# Patient Record
Sex: Male | Born: 1980
Health system: Southern US, Community
[De-identification: ages and names within clinical notes are randomized; demographics above are authoritative.]

## PROBLEM LIST (undated history)

## (undated) DIAGNOSIS — F99 Mental disorder, not otherwise specified: Secondary | ICD-10-CM

## (undated) DIAGNOSIS — I10 Essential (primary) hypertension: Secondary | ICD-10-CM

## (undated) DIAGNOSIS — Z21 Asymptomatic human immunodeficiency virus [HIV] infection status: Secondary | ICD-10-CM

## (undated) DIAGNOSIS — Z915 Personal history of self-harm: Secondary | ICD-10-CM

## (undated) DIAGNOSIS — B20 Human immunodeficiency virus [HIV] disease: Secondary | ICD-10-CM

## (undated) DIAGNOSIS — F329 Major depressive disorder, single episode, unspecified: Secondary | ICD-10-CM

## (undated) DIAGNOSIS — F32A Depression, unspecified: Secondary | ICD-10-CM

## (undated) HISTORY — DX: Major depressive disorder, single episode, unspecified: F32.9

## (undated) HISTORY — DX: Depression, unspecified: F32.A

## (undated) HISTORY — DX: Human immunodeficiency virus (HIV) disease: B20

## (undated) HISTORY — DX: Asymptomatic human immunodeficiency virus (hiv) infection status: Z21

## (undated) HISTORY — DX: Personal history of self-harm: Z91.5

---

## 2006-01-30 ENCOUNTER — Emergency Department (HOSPITAL_COMMUNITY): Admission: EM | Admit: 2006-01-30 | Discharge: 2006-01-30 | Payer: Self-pay | Admitting: Family Medicine

## 2006-09-29 ENCOUNTER — Emergency Department (HOSPITAL_COMMUNITY): Admission: EM | Admit: 2006-09-29 | Discharge: 2006-09-29 | Payer: Self-pay | Admitting: *Deleted

## 2006-10-16 ENCOUNTER — Emergency Department (HOSPITAL_COMMUNITY): Admission: EM | Admit: 2006-10-16 | Discharge: 2006-10-16 | Payer: Self-pay | Admitting: Emergency Medicine

## 2006-11-02 ENCOUNTER — Emergency Department (HOSPITAL_COMMUNITY): Admission: EM | Admit: 2006-11-02 | Discharge: 2006-11-02 | Payer: Self-pay | Admitting: Family Medicine

## 2007-05-04 ENCOUNTER — Emergency Department (HOSPITAL_COMMUNITY): Admission: EM | Admit: 2007-05-04 | Discharge: 2007-05-04 | Payer: Self-pay | Admitting: Emergency Medicine

## 2008-07-11 ENCOUNTER — Emergency Department (HOSPITAL_COMMUNITY): Admission: EM | Admit: 2008-07-11 | Discharge: 2008-07-11 | Payer: Self-pay | Admitting: Family Medicine

## 2010-03-17 ENCOUNTER — Emergency Department (HOSPITAL_COMMUNITY): Admission: EM | Admit: 2010-03-17 | Discharge: 2010-02-10 | Payer: Self-pay | Admitting: Emergency Medicine

## 2010-06-21 LAB — DIFFERENTIAL
Eosinophils Absolute: 0.3 10*3/uL (ref 0.0–0.7)
Lymphs Abs: 2 10*3/uL (ref 0.7–4.0)
Monocytes Absolute: 1.3 10*3/uL — ABNORMAL HIGH (ref 0.1–1.0)
Monocytes Relative: 13 % — ABNORMAL HIGH (ref 3–12)
Neutrophils Relative %: 65 % (ref 43–77)

## 2010-06-21 LAB — POCT I-STAT, CHEM 8
Creatinine, Ser: 1.3 mg/dL (ref 0.4–1.5)
Glucose, Bld: 97 mg/dL (ref 70–99)
Hemoglobin: 17.3 g/dL — ABNORMAL HIGH (ref 13.0–17.0)
Potassium: 3.9 mEq/L (ref 3.5–5.1)

## 2010-06-21 LAB — CBC
HCT: 45.8 % (ref 39.0–52.0)
Hemoglobin: 15.8 g/dL (ref 13.0–17.0)
MCH: 32.3 pg (ref 26.0–34.0)
MCV: 93.7 fL (ref 78.0–100.0)
RBC: 4.89 MIL/uL (ref 4.22–5.81)

## 2010-06-21 LAB — POCT CARDIAC MARKERS
CKMB, poc: <1 ng/mL — ABNORMAL LOW (ref 1.0–8.0)
Troponin i, poc: <0.05 ng/mL (ref 0.00–0.09)
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
## 2010-10-18 ENCOUNTER — Emergency Department (HOSPITAL_COMMUNITY)
Admission: EM | Admit: 2010-10-18 | Discharge: 2010-10-18 | Disposition: A | Discharge status: Home / Self Care | Payer: Medicaid Other | Attending: Emergency Medicine | Admitting: Emergency Medicine

## 2010-10-18 DIAGNOSIS — Z79899 Other long term (current) drug therapy: Secondary | ICD-10-CM | POA: Insufficient documentation

## 2010-10-18 DIAGNOSIS — Z760 Encounter for issue of repeat prescription: Secondary | ICD-10-CM | POA: Insufficient documentation

## 2010-10-18 DIAGNOSIS — I10 Essential (primary) hypertension: Secondary | ICD-10-CM | POA: Insufficient documentation

## 2011-07-22 ENCOUNTER — Emergency Department (HOSPITAL_COMMUNITY)
Admission: EM | Admit: 2011-07-22 | Discharge: 2011-07-22 | Disposition: A | Discharge status: Home / Self Care | Payer: Medicaid Other | Attending: Emergency Medicine | Admitting: Emergency Medicine

## 2011-07-22 ENCOUNTER — Encounter (HOSPITAL_COMMUNITY): Payer: Self-pay | Admitting: Emergency Medicine

## 2011-07-22 DIAGNOSIS — R45851 Suicidal ideations: Secondary | ICD-10-CM | POA: Insufficient documentation

## 2011-07-22 DIAGNOSIS — I10 Essential (primary) hypertension: Secondary | ICD-10-CM | POA: Insufficient documentation

## 2011-07-22 DIAGNOSIS — F329 Major depressive disorder, single episode, unspecified: Secondary | ICD-10-CM | POA: Insufficient documentation

## 2011-07-22 DIAGNOSIS — F3289 Other specified depressive episodes: Secondary | ICD-10-CM | POA: Insufficient documentation

## 2011-07-22 DIAGNOSIS — Z79899 Other long term (current) drug therapy: Secondary | ICD-10-CM | POA: Insufficient documentation

## 2011-07-22 HISTORY — DX: Mental disorder, not otherwise specified: F99

## 2011-07-22 HISTORY — DX: Essential (primary) hypertension: I10

## 2011-07-22 LAB — COMPREHENSIVE METABOLIC PANEL
ALT: 16 U/L (ref 0–53)
Alkaline Phosphatase: 63 U/L (ref 39–117)
CO2: 19 mEq/L (ref 19–32)
GFR calc Af Amer: >90 mL/min (ref >90)
GFR calc non Af Amer: >90 mL/min (ref >90)
Glucose, Bld: 79 mg/dL (ref 70–99)
Potassium: 3.2 mEq/L — ABNORMAL LOW (ref 3.5–5.1)
Sodium: 137 mEq/L (ref 135–145)
Total Bilirubin: 0.2 mg/dL — ABNORMAL LOW (ref 0.3–1.2)

## 2011-07-22 LAB — RAPID URINE DRUG SCREEN, HOSP PERFORMED
Barbiturates: NOT DETECTED
Tetrahydrocannabinol: POSITIVE — AB

## 2011-07-22 LAB — CBC
Hemoglobin: 14.5 g/dL (ref 13.0–17.0)
MCH: 32 pg (ref 26.0–34.0)
RBC: 4.53 MIL/uL (ref 4.22–5.81)

## 2011-07-22 MED ORDER — ALUM & MAG HYDROXIDE-SIMETH 200-200-20 MG/5ML PO SUSP: 30.0000 mL | ORAL | Status: DC | PRN

## 2011-07-22 MED ORDER — ZOLPIDEM TARTRATE 5 MG PO TABS: 5.0000 mg | ORAL_TABLET | Freq: Every evening | ORAL | Status: DC | PRN

## 2011-07-22 MED ORDER — IBUPROFEN 200 MG PO TABS: 600.0000 mg | ORAL_TABLET | Freq: Three times a day (TID) | ORAL | Status: DC | PRN

## 2011-07-22 MED ORDER — LORAZEPAM 1 MG PO TABS: 1.0000 mg | ORAL_TABLET | Freq: Three times a day (TID) | ORAL | Status: DC | PRN

## 2011-07-22 MED ORDER — LISINOPRIL 20 MG PO TABS
20.0000 mg | ORAL_TABLET | Freq: Every day | ORAL | Status: DC
  Administered 2011-07-22: 20 mg via ORAL
  Filled 2011-07-22: qty 1

## 2011-07-22 MED ORDER — ONDANSETRON HCL 4 MG PO TABS: 4.0000 mg | ORAL_TABLET | Freq: Three times a day (TID) | ORAL | Status: DC | PRN

## 2011-07-22 NOTE — ED Notes (Signed)
Pt sitting up in bed eating breakfast

## 2011-07-22 NOTE — Discharge Instructions (Signed)
FOLLOW UP AS YOU HAVE BEEN INSTRUCTED--TAKE YOUR HIGH BLOOD PRESSURE MEDICATION AS DIRECTED Suicidal Feelings, How to Help Yourself Everyone feels sad or unhappy at times, but depressing thoughts and feelings of hopelessness can lead to thoughts of suicide. It can seem as if life is too tough to handle. It is as if the mountain is just too high and your climbing skills are not great enough. At that moment these dark thoughts and feelings may seem overwhelming and never ending. It is important to remember these feelings are temporary! They will go away. If you feel as though you have reached the point where suicide is the only answer, it is time to let someone know immediately. This is the first step to feeling better. The following steps will move you to safer ground and lead you in a positive direction out of depression. HOW TO COPE AND PREVENT SUICIDE  Let family, friends, teachers and/or counselors know. Get help. Try not to isolate yourself from those who care about you. Even though you may not feel sociable or think that you are not good company, talk with someone everyday. It is best if it is face to face. Remember, they will want to help you.   Eat a regularly spaced and well-balanced diet, and get plenty of rest.   Avoid alcohol and drugs because they will only make you feel worse and may also lower your inhibitions. Remove them from the home. If you are thinking of taking an overdose of your prescribed medications, give your medicines to someone who can give them to you one day at a time. If you are on antidepressants, let your caregiver know of your feelings so he or she can provide a safer medication, if that is a concern.   Remove weapons or poisons from your home.   Try to stick to routines. That may mean just walking the dog or feeding the cat. Follow a schedule and remind yourself that you have to keep that schedule every day. Play with your pets. If it is possible, and you do not have  a pet, get one. They give you a sense of well-being, lower your blood pressure and make your heart feel good. They need you, and we all want to be needed.   Set some realistic goals and achieve them. Make a list and cross things off as you go. Accomplishments give a sense of worth. Wait until you are feeling better before doing things you find difficult or unpleasant to do.   If you are able, try to start exercising. Even half-hour periods of exercise each day will make you feel better. Getting out in the sun or into nature helps you recover from depression faster. If you have a favorite place to walk, take advantage of that.   Increase safe activities that have always given you pleasure. This may include playing your favorite music, reading a good book, painting a picture or playing your favorite instrument. Do whatever takes your mind off your depression and puts a smile on your face.   Keep your living space well lit with windows open, and let the sun shine in. Bright light definitely treats depression, not just people with the seasonal affective disorders (SAD).  Above all else remember, depression is temporary. It will go away. Do not contemplate suicide. Death as a permanent solution is not the answer. Suicide will take away the beautiful rest of life, and do lifelong harm to those around you who love you. Help  is available. National Suicide Help Lines with 24 hour help are: 1-800-SUICIDE 5627437919 Document Released: 10/01/2002 Document Revised: 03/16/2011 Document Reviewed: 02/19/2007 [facility] Patient Information 2012 [place], [place].Depression  Depression is a strong emotion of feeling unhappy that can last for weeks, months, or even longer. Depression causes problems with the ability to function in life. It upsets your:   Relationships.   Sleep.   Eating habits.   Work habits.  HOME CARE  Take all medicine as told by your doctor.   Talk with a therapist, counselor, or  friend.   Eat a healthy diet.   Exercise regularly.   Do not drink alcohol or use drugs.  GET HELP RIGHT AWAY IF: You start to have thoughts about hurting yourself or others. MAKE SURE YOU:  Understand these instructions.   Will watch your condition.   Will get help right away if you are not doing well or get worse.  Document Released: 04/29/2010 Document Revised: 03/16/2011 Document Reviewed: 04/29/2010 [facility] Patient Information 2012 [place], [place].

## 2011-07-22 NOTE — ED Notes (Signed)
Sitter at [employer]

## 2011-07-22 NOTE — ED Notes (Signed)
Made aware of plan of care.

## 2011-07-22 NOTE — ED Notes (Signed)
Rounded on patient. Patient reports he wants to go home. Reports he only came in to talk to someone and now is being kept here. Pt made aware of reason for being held here and also made aware

## 2011-07-22 NOTE — ED Notes (Signed)
Sitting up in bed talking with male family member that is visiting. Sitter remains at bedside.

## 2011-07-22 NOTE — ED Provider Notes (Signed)
Pt had tele-psych eval and pt cleared for d/c. He denies si to me and states that he realizes that calling 911 to ' just talk to someone' isn't appropriate. He states that he will call the 1-800 crisis hotline next time. He has been given referral at time of d/c. Pt is stable for d/c  Toy Baker, MD 07/22/11 1356

## 2011-07-22 NOTE — ED Notes (Signed)
Lunch tray delivered. Pt sitting up in bed eating lunch. No concerns voiced.

## 2011-07-22 NOTE — ED Notes (Signed)
Patient refusing to remove jewelry to complete policy deadlines. Patient aware that policy states that patient seeking help will need to put all belongings in bag to be locked away. Patient refusing to follow guidelines. RN notified.

## 2011-07-22 NOTE — ED Notes (Signed)
Pt discharged to home. DC instructions given. No concerns voiced. Left unit ambulating to checkout. Left in good condition.
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
## 2011-07-22 NOTE — ED Notes (Signed)
Breakfast tray given. °

## 2011-07-22 NOTE — ED Provider Notes (Signed)
History     CSN: [id number]  Arrival date & time 07/22/11  0155   First MD Initiated Contact with Patient 07/22/11 [phone]      Chief Complaint  Patient presents with  . Medical Clearance  . Suicidal     HPI  History provided by the patient. Patient is a 31 year old male with past history of hypertension and psychiatric problems who presents for thoughts of suicide earlier this evening. Patient reported calling 911 in telling her operator and please take he was feeling suicidal. Patient now tells me that he only feels like she needs someone to talk with. Patient states he does not feel like he was "cut myself or anything like that". Patient does report having a history of feeling similar symptoms when he was age 63. Patient states that he was never evaluated or treated for that and that he just "got through". Patient denies any aggravating factors causing these symptoms. Patient denies any other complaints.    Past Medical History  Diagnosis Date  . Hypertension   . Psychiatric problem     History reviewed. No pertinent past surgical history.  History reviewed. No pertinent family history.  History  Substance Use Topics  . Smoking status: Current Everyday Smoker  . Smokeless tobacco: Not on file  . Alcohol Use: Yes      Review of Systems  Constitutional: Negative for fever.  Cardiovascular: Negative for chest pain.  Gastrointestinal: Negative for abdominal pain.  Neurological: Negative for headaches.  Psychiatric/Behavioral: Positive for suicidal ideas. Negative for hallucinations, confusion and self-injury.    Allergies  Review of patient's allergies indicates no known allergies.  Home Medications   Current Outpatient Rx  Name Route Sig Dispense Refill  . LISINOPRIL 20 MG PO TABS Oral Take 20 mg by mouth daily.      BP 166/100  Pulse 87  Temp(Src) 98.3 F (36.8 C) (Oral)  Resp 20  Wt 155 lb (70.308 kg)  SpO2 97%  Physical Exam  Nursing note and vitals  reviewed. Constitutional: He is oriented to person, place, and time. He appears well-developed and well-nourished. No distress.  HENT:  Head: Normocephalic and atraumatic.  Cardiovascular: Normal rate and regular rhythm.   Pulmonary/Chest: Effort normal and breath sounds normal. No respiratory distress. He has no wheezes. He has no rales.  Abdominal: Soft.  Neurological: He is alert and oriented to person, place, and time.  Skin: Skin is warm.  Psychiatric: He has a normal mood and affect. His behavior is normal.    ED Course  Procedures   Results for orders placed during the hospital encounter of 07/22/11  CBC      Component Value Range   WBC 5.0  4.0 - 10.5 (K/uL)   RBC 4.53  4.22 - 5.81 (MIL/uL)   Hemoglobin 14.5  13.0 - 17.0 (g/dL)   HCT 11.9  14.7 - 82.9 (%)   MCV 93.6  78.0 - 100.0 (fL)   MCH 32.0  26.0 - 34.0 (pg)   MCHC 34.2  30.0 - 36.0 (g/dL)   RDW 56.2  13.0 - 86.5 (%)   Platelets 330  150 - 400 (K/uL)  COMPREHENSIVE METABOLIC PANEL      Component Value Range   Sodium 137  135 - 145 (mEq/L)   Potassium 3.2 (*) 3.5 - 5.1 (mEq/L)   Chloride 101  96 - 112 (mEq/L)   CO2 19  19 - 32 (mEq/L)   Glucose, Bld 79  70 - 99 (mg/dL)  BUN 19  6 - 23 (mg/dL)   Creatinine, Ser 9.56  0.50 - 1.35 (mg/dL)   Calcium 9.3  8.4 - 21.3 (mg/dL)   Total Protein 9.2 (*) 6.0 - 8.3 (g/dL)   Albumin 4.5  3.5 - 5.2 (g/dL)   AST 26  0 - 37 (U/L)   ALT 16  0 - 53 (U/L)   Alkaline Phosphatase 63  39 - 117 (U/L)   Total Bilirubin 0.2 (*) 0.3 - 1.2 (mg/dL)   GFR calc non Af Amer >90  >90 (mL/min)   GFR calc Af Amer >90  >90 (mL/min)  ETHANOL      Component Value Range   Alcohol, Ethyl (B) 180 (*) 0 - 11 (mg/dL)  ACETAMINOPHEN LEVEL      Component Value Range   Acetaminophen (Tylenol), Serum <15.0  10 - 30 (ug/mL)  URINE RAPID DRUG SCREEN (HOSP PERFORMED)      Component Value Range   Opiates NONE DETECTED  NONE DETECTED    Cocaine POSITIVE (*) NONE DETECTED    Benzodiazepines NONE  DETECTED  NONE DETECTED    Amphetamines NONE DETECTED  NONE DETECTED    Tetrahydrocannabinol POSITIVE (*) NONE DETECTED    Barbiturates NONE DETECTED  NONE DETECTED        1. Depression       MDM  3:55 AM patient seen and evaluated. Patient no acute distress.   IVC papers taken out by Dr. Hyacinth Meeker.  I have spoken with act team they will see pt.  Telepsych also ordered.  Psych holding orders in place.     Angus Seller, [place] 07/22/11 [phone]

## 2011-07-22 NOTE — BH Assessment (Signed)
Assessment Note   [name] is an 31 y.o. male.   Pt reports he was depressed last night and wanted someone to speak with about his depression and called 911.  Pt reports that he has had thoughts of suicide but "I would not act on it."  Pt denies plan or intent to hurt self at this time.  Pt denies HI, AVH and does not want help with SA related issues.  Pt reports drinking alcohol but appears to minimize cocaine and THC use and did not provide use/duration related info.   Pt made eye contact and spoke calmly reporting he wanted to leave.  Pt Ox3 and pt was cooperative.  While pt affect was appropriate, pt could not elaborate on his rationale for contact the police about being suicidal and connecting it to the police bringing him to the ER.  Pt repeated he wanted someone to speak with about "how I am feeling and I just didn't think the ER is where I would end up."  Pt judgement is poor and he reports being last hospitalized at [facility] when he was a teenager.    Pt is having a Tele Psych and the MD will make recommendations based on his or her evaluation.  If Inptx is recommended then [facility] will review for placement.  Axis I: Mood Disorder NOS and Polysubstance Abuse Axis II: Deferred Axis III:  Past Medical History  Diagnosis Date  . Hypertension   . Psychiatric problem    Axis IV: other psychosocial or environmental problems, problems related to social environment and problems with primary support group Axis V: 31-40 impairment in reality testing  Past Medical History:  Past Medical History  Diagnosis Date  . Hypertension   . Psychiatric problem     History reviewed. No pertinent past surgical history.  Family History: History reviewed. No pertinent family history.  Social History:  reports that he has been smoking.  He does not have any smokeless tobacco history on file. He reports that he drinks alcohol. He reports that he uses illicit drugs (Marijuana).  Additional  Social History:  Alcohol / Drug Use Pain Medications: na Prescriptions: na Over the Counter: na History of alcohol / drug use?: Yes Substance #1 Name of Substance 1: alcohol 1 - Age of First Use: teen 1 - Amount (size/oz): varies 1 - Frequency: 4x week 1 - Duration: years 1 - Last Use / Amount: 07-21-11 / 6pk Allergies: No Known Allergies  Home Medications:  Medications Prior to Admission  Medication Dose Route Frequency Provider Last Rate Last Dose  . alum & mag hydroxide-simeth (MAALOX/MYLANTA) 200-200-20 MG/5ML suspension 30 mL  30 mL Oral PRN Angus Seller, PA      . ibuprofen (ADVIL,MOTRIN) tablet 600 mg  600 mg Oral Q8H PRN Angus Seller, PA      . LORazepam (ATIVAN) tablet 1 mg  1 mg Oral Q8H PRN Angus Seller, PA      . ondansetron [facility]) tablet 4 mg  4 mg Oral Q8H PRN Angus Seller, PA      . zolpidem (AMBIEN) tablet 5 mg  5 mg Oral QHS PRN Angus Seller, PA       Medications Prior to Admission  Medication Sig Dispense Refill  . lisinopril (PRINIVIL,ZESTRIL) 20 MG tablet Take 20 mg by mouth daily.        OB/GYN Status:  No LMP for male patient.  General Assessment Data Location of Assessment: [facility] ED Living  Arrangements: Alone Can pt return to current living arrangement?: Yes Admission Status: Involuntary Is patient capable of signing voluntary admission?: Yes Transfer from: Acute Hospital Referral Source: MD  Education Status Is patient currently in school?: No  Risk to self Suicidal Ideation: No-Not Currently/Within Last 6 Months Suicidal Intent: No-Not Currently/Within Last 6 Months Is patient at risk for suicide?: No Suicidal Plan?: No-Not Currently/Within Last 6 Months Access to Means: No What has been your use of drugs/alcohol within the last 12 months?: alcohol Previous Attempts/Gestures: Yes How many times?: 1  Other Self Harm Risks: 0 Triggers for Past Attempts: Unpredictable Intentional Self Injurious Behavior: None Family Suicide History:  Unknown Recent stressful life event(s): Other (Comment) (depressed and wanted to speak with someone) Persecutory voices/beliefs?: No Depression: Yes Depression Symptoms: Tearfulness;Isolating;Fatigue;Guilt;Loss of interest in usual pleasures;Feeling worthless/self pity;Feeling angry/irritable Substance abuse history and/or treatment for substance abuse?: No Suicide prevention information given to non-admitted patients: Yes  Risk to Others Homicidal Ideation: No Thoughts of Harm to Others: No Current Homicidal Intent: No Current Homicidal Plan: No Access to Homicidal Means: No Identified Victim: 0 History of harm to others?: No Assessment of Violence: None Noted Violent Behavior Description: 0 Does patient have access to weapons?: No Criminal Charges Pending?: No Does patient have a court date: No  Psychosis Hallucinations: None noted Delusions: None noted  Mental Status Report Appear/Hygiene: Disheveled Eye Contact: Fair Motor Activity: Unremarkable Speech: Logical/coherent Level of Consciousness: Alert Mood: Depressed;Anxious Affect: Anxious Anxiety Level: Minimal Thought Processes: Coherent Judgement: Impaired (called 911 because he was depressed) Orientation: Person;Place;Situation Obsessive Compulsive Thoughts/Behaviors: None  Cognitive Functioning Concentration: Decreased Memory: Recent Intact;Remote Intact IQ: Average Insight: Fair Impulse Control: Poor Appetite: Fair Weight Loss: 0  Weight Gain: 0  Sleep: Decreased Total Hours of Sleep: 5  Vegetative Symptoms: None  Prior Inpatient Therapy Prior Inpatient Therapy: Yes Prior Therapy Dates: 1998 Prior Therapy Facilty/Provider(s): [facility] Reason for Treatment: depression  Prior Outpatient Therapy Prior Outpatient Therapy: No Prior Therapy Dates: 0 Prior Therapy Facilty/Provider(s): 0 Reason for Treatment: 0  ADL Screening (condition at time of admission) Patient's cognitive ability adequate to safely  complete daily activities?: Yes Patient able to express need for assistance with ADLs?: Yes Independently performs ADLs?: Yes Weakness of Legs: None Weakness of Arms/Hands: None  Home Assistive Devices/Equipment Home Assistive Devices/Equipment: None  Therapy Consults (therapy consults require a physician order) PT Evaluation Needed: No OT Evalulation Needed: No SLP Evaluation Needed: No Abuse/Neglect Assessment (Assessment to be complete while patient is alone) Physical Abuse: Denies Verbal Abuse: Denies Sexual Abuse: Denies Exploitation of patient/patient's resources: Denies Self-Neglect: Denies Values / Beliefs Cultural Requests During Hospitalization: None Spiritual Requests During Hospitalization: None Consults Spiritual Care Consult Needed: No Social Work Consult Needed: No Merchant navy officer (For Healthcare) Advance Directive: Patient does not have advance directive Pre-existing out of facility DNR order (yellow form or pink MOST form): No Nutrition Screen Unintentional weight loss greater than 10lbs within the last month: No Problems chewing or swallowing foods and/or liquids: No Home Tube Feeding or Total Parenteral Nutrition (TPN): No Patient appears severely malnourished: No  Additional Information 1:1 In Past 12 Months?: No CIRT Risk: No Elopement Risk: No Does patient have medical clearance?: No     Disposition:  Disposition Disposition of Patient: Other dispositions (Tele Psych will make recommendation) Other disposition(s): Other (Comment) (See Tele Psych for recommendation)  On Site Evaluation by:   Reviewed with Physician:     [name], [name] 07/22/2011 11:49 AM

## 2011-07-22 NOTE — ED Notes (Signed)
telepsyche paperwork faxed. Pt sitting in bed watching television. Sitter remains at bedside.

## 2011-07-22 NOTE — ED Notes (Signed)
MD made aware of patient's bp.

## 2011-07-23 NOTE — ED Provider Notes (Signed)
Medical screening examination/treatment/procedure(s) were performed by non-physician practitioner and as supervising physician I was immediately available for consultation/collaboration.   Vida Roller, MD 07/23/11 1002

## 2011-07-27 ENCOUNTER — Other Ambulatory Visit (HOSPITAL_COMMUNITY)
Admission: RE | Admit: 2011-07-27 | Discharge: 2011-07-27 | Disposition: A | Discharge status: Home / Self Care | Payer: Medicaid Other | Source: Ambulatory Visit | Attending: Infectious Diseases | Admitting: Infectious Diseases

## 2011-07-27 ENCOUNTER — Ambulatory Visit (INDEPENDENT_AMBULATORY_CARE_PROVIDER_SITE_OTHER): Payer: Medicaid Other

## 2011-07-27 DIAGNOSIS — Z113 Encounter for screening for infections with a predominantly sexual mode of transmission: Secondary | ICD-10-CM | POA: Insufficient documentation

## 2011-07-27 DIAGNOSIS — Z23 Encounter for immunization: Secondary | ICD-10-CM

## 2011-07-27 DIAGNOSIS — I10 Essential (primary) hypertension: Secondary | ICD-10-CM

## 2011-07-27 DIAGNOSIS — F819 Developmental disorder of scholastic skills, unspecified: Secondary | ICD-10-CM

## 2011-07-27 DIAGNOSIS — B20 Human immunodeficiency virus [HIV] disease: Secondary | ICD-10-CM

## 2011-07-27 DIAGNOSIS — Z915 Personal history of self-harm: Secondary | ICD-10-CM

## 2011-07-27 DIAGNOSIS — F99 Mental disorder, not otherwise specified: Secondary | ICD-10-CM

## 2011-07-27 LAB — LIPID PANEL: Cholesterol: 164 mg/dL (ref 0–200)

## 2011-07-27 LAB — CBC WITH DIFFERENTIAL/PLATELET
Basophils Relative: 1 % (ref 0–1)
Hemoglobin: 13.6 g/dL (ref 13.0–17.0)
Lymphs Abs: 1.3 10*3/uL (ref 0.7–4.0)
MCHC: 33.3 g/dL (ref 30.0–36.0)
Monocytes Relative: 7 % (ref 3–12)
Neutro Abs: 2.7 10*3/uL (ref 1.7–7.7)
Neutrophils Relative %: 62 % (ref 43–77)
RBC: 4.31 MIL/uL (ref 4.22–5.81)

## 2011-07-27 LAB — COMPLETE METABOLIC PANEL WITH GFR
AST: 18 U/L (ref 0–37)
Calcium: 9.7 mg/dL (ref 8.4–10.5)
Creat: 0.96 mg/dL (ref 0.50–1.35)
GFR, Est African American: >89 mL/min
Glucose, Bld: 86 mg/dL (ref 70–99)
Potassium: 4 mEq/L (ref 3.5–5.3)
Sodium: 137 mEq/L (ref 135–145)
Total Bilirubin: 0.4 mg/dL (ref 0.3–1.2)

## 2011-07-28 DIAGNOSIS — F99 Mental disorder, not otherwise specified: Secondary | ICD-10-CM | POA: Insufficient documentation

## 2011-07-28 DIAGNOSIS — Z9151 Personal history of suicidal behavior: Secondary | ICD-10-CM

## 2011-07-28 DIAGNOSIS — Z915 Personal history of self-harm: Secondary | ICD-10-CM | POA: Insufficient documentation

## 2011-07-28 DIAGNOSIS — F819 Developmental disorder of scholastic skills, unspecified: Secondary | ICD-10-CM | POA: Insufficient documentation

## 2011-07-28 HISTORY — DX: Personal history of suicidal behavior: Z91.51

## 2011-07-28 LAB — URINALYSIS
Glucose, UA: NEGATIVE mg/dL
Hgb urine dipstick: NEGATIVE
Leukocytes, UA: NEGATIVE
Nitrite: NEGATIVE
Protein, ur: NEGATIVE mg/dL
pH: 6 (ref 5.0–8.0)

## 2011-07-28 LAB — HEPATITIS B CORE ANTIBODY, TOTAL: Hep B Core Total Ab: NEGATIVE

## 2011-07-28 LAB — HEPATITIS B SURFACE ANTIBODY,QUALITATIVE: Hep B S Ab: NEGATIVE

## 2011-07-28 LAB — T-HELPER CELL (CD4) - (RCID CLINIC ONLY): CD4 % Helper T Cell: 27 % — ABNORMAL LOW (ref 33–55)

## 2011-07-28 LAB — HEPATITIS A ANTIBODY, TOTAL: Hep A Total Ab: NEGATIVE

## 2011-07-28 NOTE — Progress Notes (Signed)
Pt clearly has some form of learning disability due to the answers he has given during the intake.  Questions were reduced to a childlike manner for pt to understand.    Laurell Josephs, RN

## 2011-08-01 LAB — HIV-1 RNA ULTRAQUANT REFLEX TO GENTYP+
HIV 1 RNA Quant: 4075 copies/mL — ABNORMAL HIGH (ref <20)
HIV-1 RNA Quant, Log: 3.61 {Log} — ABNORMAL HIGH (ref <1.30)

## 2011-08-10 ENCOUNTER — Ambulatory Visit: Payer: Medicaid Other | Admitting: Internal Medicine

## 2011-08-11 LAB — HIV-1 GENOTYPR PLUS

## 2011-08-25 ENCOUNTER — Ambulatory Visit (INDEPENDENT_AMBULATORY_CARE_PROVIDER_SITE_OTHER): Payer: Medicaid Other | Admitting: Internal Medicine

## 2011-08-25 ENCOUNTER — Encounter: Payer: Self-pay | Admitting: Internal Medicine

## 2011-08-25 VITALS — BP 178/124 | HR 70 | Temp 98.2°F | Wt 138.0 lb

## 2011-08-25 DIAGNOSIS — Z23 Encounter for immunization: Secondary | ICD-10-CM

## 2011-08-25 DIAGNOSIS — I10 Essential (primary) hypertension: Secondary | ICD-10-CM | POA: Insufficient documentation

## 2011-08-25 DIAGNOSIS — B20 Human immunodeficiency virus [HIV] disease: Secondary | ICD-10-CM

## 2011-08-25 MED ORDER — DARUNAVIR ETHANOLATE 800 MG PO TABS: 800.0000 mg | ORAL_TABLET | Freq: Every day | ORAL | Status: DC

## 2011-08-25 MED ORDER — RITONAVIR 100 MG PO TABS: 100.0000 mg | ORAL_TABLET | Freq: Every day | ORAL | Status: DC

## 2011-08-25 MED ORDER — EMTRICITABINE-TENOFOVIR DF 200-300 MG PO TABS: 1.0000 | ORAL_TABLET | Freq: Every day | ORAL | Status: DC

## 2011-08-25 MED ORDER — LISINOPRIL 20 MG PO TABS: 20.0000 mg | ORAL_TABLET | Freq: Every day | ORAL | Status: DC

## 2011-08-25 MED ORDER — AMLODIPINE BESYLATE 5 MG PO TABS: 5.0000 mg | ORAL_TABLET | Freq: Every day | ORAL | Status: DC

## 2011-08-25 NOTE — Patient Instructions (Signed)
Take your medicine every day

## 2011-08-25 NOTE — Assessment & Plan Note (Signed)
His blood pressure is notably elevated. I did refill his medications today and told him to start taking it today. The patient voiced his understanding. He is going to follow up with his primary physician regarding his blood pressure.

## 2011-08-25 NOTE — Progress Notes (Signed)
  Subjective:    Patient ID: [name], male    DOB: 1980-06-13, 31 y.o.   MRN: [id number]  HPI He comes in for evaluation as a new patient. This is a new diagnosis for him. He recently found out his partner was positive and he has been tested and a recent emergency room visit where he went for counseling. The patient has a learning disability. He also has depression. He does have a history of suicide attempt but no recent suicidal ideation. He also has poorly controlled blood pressure and has been off his blood pressure medicines for 2 months. He does followup with family practice clinic. The patient did ask questions and have some minimal understanding of HIV.   Review of Systems  Constitutional: Negative.   HENT: Negative for sore throat and trouble swallowing.   Respiratory: Negative for cough, choking, shortness of breath and wheezing.   Cardiovascular: Negative.   Gastrointestinal: Negative.   Musculoskeletal: Negative.   Skin: Negative.   Neurological: Negative.   Hematological: Negative for adenopathy.  Psychiatric/Behavioral: Positive for dysphoric mood. Negative for suicidal ideas, behavioral problems and sleep disturbance. The patient is not nervous/anxious.        Objective:   Physical Exam  Constitutional: He appears well-developed and well-nourished. No distress.  HENT:  Mouth/Throat: Oropharynx is clear and moist. No oropharyngeal exudate.  Cardiovascular: Normal rate, regular rhythm and normal heart sounds.  Exam reveals no gallop and no friction rub.   No murmur heard. Pulmonary/Chest: Effort normal and breath sounds normal. No respiratory distress. He has no wheezes. He has no rales.  Abdominal: Bowel sounds are normal. He exhibits no distension. There is no tenderness. There is no rebound.  Lymphadenopathy:    He has no cervical adenopathy.  Skin: Skin is warm and dry. No rash noted. No erythema.  Psychiatric: He has a normal mood and affect. His behavior is  normal.          Assessment & Plan:

## 2011-08-25 NOTE — Assessment & Plan Note (Signed)
I did discuss treatment options with the patient. He does tell me that he has had a difficult time taking medicine daily and does miss doses of his blood pressure medicine when he has it. He tells me about 2-3 times a week he forgets. He is hopeful that he is able to be more compliant now. He is interested in a one pill a day, however of the options I did discuss with him that I prefer to start with a 3 pillow they options that is more for giving and if he is doing well taking medicine daily this may be readdressed and he can start a 1 pill a day option at that time. I did remind him that he needs to use condoms with all sexual activity.he does not need prophylaxis at this time. I will have him return in 4 weeks for labs and I will see him one to 2 weeks later.

## 2011-09-26 ENCOUNTER — Ambulatory Visit: Payer: Medicaid Other | Admitting: *Deleted

## 2011-09-26 ENCOUNTER — Other Ambulatory Visit: Payer: Medicaid Other

## 2011-09-26 VITALS — BP 130/72 | HR 71

## 2011-09-26 DIAGNOSIS — I10 Essential (primary) hypertension: Secondary | ICD-10-CM

## 2011-09-26 DIAGNOSIS — B20 Human immunodeficiency virus [HIV] disease: Secondary | ICD-10-CM

## 2011-09-26 LAB — COMPREHENSIVE METABOLIC PANEL
Albumin: 4.5 g/dL (ref 3.5–5.2)
Alkaline Phosphatase: 40 U/L (ref 39–117)
BUN: 10 mg/dL (ref 6–23)
CO2: 23 mEq/L (ref 19–32)
Glucose, Bld: 74 mg/dL (ref 70–99)
Sodium: 135 mEq/L (ref 135–145)
Total Bilirubin: 0.4 mg/dL (ref 0.3–1.2)
Total Protein: 7.4 g/dL (ref 6.0–8.3)

## 2011-09-26 LAB — CBC WITH DIFFERENTIAL/PLATELET
Basophils Relative: 1 % (ref 0–1)
Eosinophils Absolute: 0.1 10*3/uL (ref 0.0–0.7)
HCT: 37.8 % — ABNORMAL LOW (ref 39.0–52.0)
Hemoglobin: 12.7 g/dL — ABNORMAL LOW (ref 13.0–17.0)
Lymphs Abs: 1.3 10*3/uL (ref 0.7–4.0)
MCH: 30.8 pg (ref 26.0–34.0)
MCHC: 33.6 g/dL (ref 30.0–36.0)
MCV: 91.5 fL (ref 78.0–100.0)
Monocytes Absolute: 0.2 10*3/uL (ref 0.1–1.0)
Monocytes Relative: 6 % (ref 3–12)
RBC: 4.13 MIL/uL — ABNORMAL LOW (ref 4.22–5.81)

## 2011-09-26 NOTE — Progress Notes (Signed)
Here for labs & wanted bp done as he has been compliant with bp meds for a month. BP is down. I discussed salt in diet & smoking cessation to help lower bp. States he is trying.  He has refills & knows to call a week ahead of time to get them

## 2011-09-27 LAB — T-HELPER CELL (CD4) - (RCID CLINIC ONLY)
CD4 % Helper T Cell: 32 % — ABNORMAL LOW (ref 33–55)
CD4 T Cell Abs: 380 uL — ABNORMAL LOW (ref 400–2700)

## 2011-10-10 ENCOUNTER — Ambulatory Visit (INDEPENDENT_AMBULATORY_CARE_PROVIDER_SITE_OTHER): Payer: Medicaid Other | Admitting: Internal Medicine

## 2011-10-10 ENCOUNTER — Encounter: Payer: Self-pay | Admitting: Internal Medicine

## 2011-10-10 VITALS — BP 126/77 | HR 76 | Temp 98.1°F | Ht 68.0 in | Wt 131.0 lb

## 2011-10-10 DIAGNOSIS — I10 Essential (primary) hypertension: Secondary | ICD-10-CM

## 2011-10-10 DIAGNOSIS — B20 Human immunodeficiency virus [HIV] disease: Secondary | ICD-10-CM

## 2011-10-10 NOTE — Assessment & Plan Note (Signed)
He is doing well but unfortunately likely will be out of medication for one week. He was counseled extensively on the dangers of missing medications, particularly since he is doing so well with compliance and nearly undetectable viral load. He did voice his understanding. The patient was offered and given condoms. He knows to use them with all sexual activity. He will return in 3 months with labs 2 weeks before to keep a close eye on his viral load.

## 2011-10-10 NOTE — Progress Notes (Signed)
  Subjective:    Patient ID: [name], male    DOB: [birth year], 31 y.o.   MRN: [id number]  HPI He comes in for evaluation of his HIV. This is his second visit and he was recently started on his current regimen which is Norvir, Prezista, and Truvada. He reports 100% compliance. Unfortunately, he did provide a one week supply of his own medication to a friend who had run out of their medication. He therefore likely will be out one week though he is hopeful that the pharmacy will be able to fill it early.  He otherwise has no complaints. He has been seeing a family practice doctor in [facility] though is now going to transfer his primary care to someone in [place] though this has not yet been determined. He has had no abdominal discomfort, rash or other issues with the medication.   Review of Systems  Constitutional: Positive for fatigue. Negative for fever, appetite change and unexpected weight change.  HENT: Negative for sore throat and trouble swallowing.   Respiratory: Negative for cough and shortness of breath.   Cardiovascular: Negative for chest pain, palpitations and leg swelling.  Gastrointestinal: Negative for nausea, abdominal pain, diarrhea and constipation.  Musculoskeletal: Negative for myalgias, joint swelling and arthralgias.  Skin: Negative for rash.  Neurological: Negative for dizziness, weakness and headaches.  Hematological: Negative for adenopathy.  Psychiatric/Behavioral: Negative for dysphoric mood. The patient is not nervous/anxious.        Objective:   Physical Exam  Constitutional: He appears well-developed and well-nourished. No distress.  HENT:  Mouth/Throat: Oropharynx is clear and moist. No oropharyngeal exudate.  Cardiovascular: Normal rate, regular rhythm and normal heart sounds.  Exam reveals no gallop and no friction rub.   No murmur heard. Pulmonary/Chest: Effort normal and breath sounds normal. No respiratory distress. He has no wheezes. He has no  rales.  Abdominal: Soft. Bowel sounds are normal. He exhibits no distension. There is no tenderness. There is no rebound.  Lymphadenopathy:    He has no cervical adenopathy.  Skin: Skin is warm and dry. No rash noted.          Assessment & Plan:

## 2011-10-10 NOTE — Assessment & Plan Note (Signed)
His blood pressure is better today compared to last visit. He is going to establish with a new primary care physician and is working with [facility].

## 2011-11-07 ENCOUNTER — Encounter: Payer: Self-pay | Admitting: Internal Medicine

## 2011-11-07 ENCOUNTER — Telehealth: Payer: Self-pay | Admitting: *Deleted

## 2011-11-07 ENCOUNTER — Ambulatory Visit (INDEPENDENT_AMBULATORY_CARE_PROVIDER_SITE_OTHER): Payer: Medicaid Other | Admitting: Internal Medicine

## 2011-11-07 VITALS — BP 137/80 | HR 75 | Temp 98.2°F | Ht 68.0 in | Wt 141.0 lb

## 2011-11-07 DIAGNOSIS — N529 Male erectile dysfunction, unspecified: Secondary | ICD-10-CM

## 2011-11-07 MED ORDER — SILDENAFIL CITRATE 25 MG PO TABS: 25.0000 mg | ORAL_TABLET | Freq: Every day | ORAL | Status: DC | PRN

## 2011-11-07 NOTE — Assessment & Plan Note (Signed)
Will give a trial of Viagra. No symptoms concerning of angina. Specifically no chest pain with walking or at rest. If this does not improve with the Viagra, he may be referred to urology

## 2011-11-07 NOTE — Progress Notes (Signed)
  Subjective:    Patient ID: [name], male    DOB: [birth year], 31 y.o.   MRN: [id number]  HPI He comes in here concerned about erectile dysfunction. He noted this about 10 months ago and has only had an erection about 5 times over the last 10 months. He was concerned about his blood pressure medicines being the cause though this did occur prior to starting the blood pressure medicines. No penile pain, no swelling, no edema, no discharge.   Review of Systems  Genitourinary: Negative for dysuria, frequency, hematuria, discharge, penile swelling, scrotal swelling, difficulty urinating, genital sores, penile pain and testicular pain.       Objective:   Physical Exam  Constitutional: He appears well-developed and well-nourished. No distress.          Assessment & Plan:

## 2011-11-07 NOTE — Telephone Encounter (Signed)
error 

## 2011-11-29 ENCOUNTER — Encounter: Payer: Medicaid Other | Admitting: Internal Medicine

## 2011-12-28 ENCOUNTER — Other Ambulatory Visit: Payer: Medicaid Other

## 2011-12-28 DIAGNOSIS — B20 Human immunodeficiency virus [HIV] disease: Secondary | ICD-10-CM

## 2011-12-28 LAB — CBC WITH DIFFERENTIAL/PLATELET
Basophils Absolute: 0.1 10*3/uL (ref 0.0–0.1)
Basophils Relative: 2 % — ABNORMAL HIGH (ref 0–1)
Eosinophils Absolute: 0.1 10*3/uL (ref 0.0–0.7)
Eosinophils Relative: 3 % (ref 0–5)
HCT: 37.1 % — ABNORMAL LOW (ref 39.0–52.0)
MCH: 30.7 pg (ref 26.0–34.0)
MCHC: 33.7 g/dL (ref 30.0–36.0)
Monocytes Absolute: 0.3 10*3/uL (ref 0.1–1.0)
Neutro Abs: 1.8 10*3/uL (ref 1.7–7.7)
RDW: 12.1 % (ref 11.5–15.5)

## 2011-12-29 LAB — COMPLETE METABOLIC PANEL WITH GFR
AST: 18 U/L (ref 0–37)
Alkaline Phosphatase: 35 U/L — ABNORMAL LOW (ref 39–117)
BUN: 9 mg/dL (ref 6–23)
Creat: 0.93 mg/dL (ref 0.50–1.35)
GFR, Est Non African American: >89 mL/min
Glucose, Bld: 94 mg/dL (ref 70–99)
Potassium: 4.8 mEq/L (ref 3.5–5.3)
Total Bilirubin: 0.6 mg/dL (ref 0.3–1.2)

## 2011-12-29 LAB — T-HELPER CELL (CD4) - (RCID CLINIC ONLY)
CD4 % Helper T Cell: 32 % — ABNORMAL LOW (ref 33–55)
CD4 T Cell Abs: 420 uL (ref 400–2700)

## 2011-12-29 LAB — HIV-1 RNA QUANT-NO REFLEX-BLD: HIV-1 RNA Quant, Log: <1.3 {Log} (ref <1.30)

## 2012-01-11 ENCOUNTER — Encounter: Payer: Self-pay | Admitting: Internal Medicine

## 2012-01-11 ENCOUNTER — Ambulatory Visit (INDEPENDENT_AMBULATORY_CARE_PROVIDER_SITE_OTHER): Payer: Medicaid Other | Admitting: Internal Medicine

## 2012-01-11 VITALS — BP 138/81 | HR 69 | Temp 98.3°F | Ht 68.0 in | Wt 137.5 lb

## 2012-01-11 DIAGNOSIS — B20 Human immunodeficiency virus [HIV] disease: Secondary | ICD-10-CM

## 2012-01-11 DIAGNOSIS — I10 Essential (primary) hypertension: Secondary | ICD-10-CM

## 2012-01-11 DIAGNOSIS — Z23 Encounter for immunization: Secondary | ICD-10-CM

## 2012-01-11 MED ORDER — LISINOPRIL 20 MG PO TABS: 20.0000 mg | ORAL_TABLET | Freq: Every day | ORAL | Status: DC

## 2012-01-11 MED ORDER — EMTRICITABINE-TENOFOVIR DF 200-300 MG PO TABS: 1.0000 | ORAL_TABLET | Freq: Every day | ORAL | Status: DC

## 2012-01-11 MED ORDER — DARUNAVIR ETHANOLATE 800 MG PO TABS: 800.0000 mg | ORAL_TABLET | Freq: Every day | ORAL | Status: DC

## 2012-01-11 MED ORDER — AMLODIPINE BESYLATE 5 MG PO TABS: 5.0000 mg | ORAL_TABLET | Freq: Every day | ORAL | Status: DC

## 2012-01-11 MED ORDER — RITONAVIR 100 MG PO TABS: 100.0000 mg | ORAL_TABLET | Freq: Every day | ORAL | Status: DC

## 2012-01-11 NOTE — Progress Notes (Signed)
  Subjective:    Patient ID: [name], male    DOB: 1980-11-01, 31 y.o.   MRN: [id number]  HPI He comes in here for routine followup. He continues to take Prezista, Norvir and Truvada. He has no significant issues with the medication and in fact feels really well. No recent emergency room visits or hospitalizations. He reports 100% compliance no new issues. He also has blood pressure and does take his blood pressure medications. He tried Viagra which I prescribed to him but at this time is not active so as not using.   Review of Systems  Constitutional: Negative for fever and chills.  HENT: Negative for sore throat and trouble swallowing.   Respiratory: Negative for cough and shortness of breath.   Cardiovascular: Negative for palpitations and leg swelling.  Gastrointestinal: Negative for nausea and diarrhea.  Musculoskeletal: Negative for myalgias, joint swelling and arthralgias.  Skin: Negative for rash.  Neurological: Negative for headaches.  Psychiatric/Behavioral: Negative for suicidal ideas, disturbed wake/sleep cycle and dysphoric mood. The patient is not nervous/anxious.        Objective:   Physical Exam  Constitutional: He appears well-developed and well-nourished. No distress.  HENT:  Mouth/Throat: Oropharynx is clear and moist. No oropharyngeal exudate.  Cardiovascular: Normal rate, regular rhythm and normal heart sounds.  Exam reveals no gallop and no friction rub.   No murmur heard. Pulmonary/Chest: Effort normal and breath sounds normal. No respiratory distress. He has no wheezes. He has no rales.          Assessment & Plan:

## 2012-01-11 NOTE — Assessment & Plan Note (Signed)
He continues to do well and was encouraged to continue to take his medicines. He will return in 6 months. He does use condoms with sexual activity.

## 2012-01-11 NOTE — Assessment & Plan Note (Signed)
His blood pressure is stable, I will continue to monitor this and no changes to his medications at this time

## 2012-01-15 ENCOUNTER — Ambulatory Visit: Payer: Medicaid Other

## 2012-07-11 ENCOUNTER — Other Ambulatory Visit: Payer: Medicaid Other

## 2012-07-11 ENCOUNTER — Other Ambulatory Visit: Payer: Self-pay | Admitting: Internal Medicine

## 2012-07-11 DIAGNOSIS — B20 Human immunodeficiency virus [HIV] disease: Secondary | ICD-10-CM

## 2012-07-12 LAB — T-HELPER CELL (CD4) - (RCID CLINIC ONLY): CD4 T Cell Abs: 670 uL (ref 400–2700)

## 2012-07-29 ENCOUNTER — Ambulatory Visit (INDEPENDENT_AMBULATORY_CARE_PROVIDER_SITE_OTHER): Payer: Medicaid Other | Admitting: Internal Medicine

## 2012-07-29 ENCOUNTER — Encounter: Payer: Self-pay | Admitting: Internal Medicine

## 2012-07-29 ENCOUNTER — Encounter: Payer: Self-pay | Admitting: *Deleted

## 2012-07-29 VITALS — BP 155/96 | HR 109 | Temp 99.3°F | Ht 68.0 in | Wt 149.5 lb

## 2012-07-29 DIAGNOSIS — Z23 Encounter for immunization: Secondary | ICD-10-CM

## 2012-07-29 DIAGNOSIS — Z113 Encounter for screening for infections with a predominantly sexual mode of transmission: Secondary | ICD-10-CM

## 2012-07-29 DIAGNOSIS — I10 Essential (primary) hypertension: Secondary | ICD-10-CM

## 2012-07-29 DIAGNOSIS — B20 Human immunodeficiency virus [HIV] disease: Secondary | ICD-10-CM

## 2012-07-29 DIAGNOSIS — Z79899 Other long term (current) drug therapy: Secondary | ICD-10-CM

## 2012-07-29 MED ORDER — AMLODIPINE BESYLATE 5 MG PO TABS: 5.0000 mg | ORAL_TABLET | Freq: Every day | ORAL | Status: DC

## 2012-07-29 MED ORDER — RITONAVIR 100 MG PO TABS: 100.0000 mg | ORAL_TABLET | Freq: Every day | ORAL | Status: DC

## 2012-07-29 MED ORDER — EMTRICITABINE-TENOFOVIR DF 200-300 MG PO TABS: 1.0000 | ORAL_TABLET | Freq: Every day | ORAL | Status: DC

## 2012-07-29 MED ORDER — LISINOPRIL 20 MG PO TABS: 20.0000 mg | ORAL_TABLET | Freq: Every day | ORAL | Status: DC

## 2012-07-29 MED ORDER — DARUNAVIR ETHANOLATE 800 MG PO TABS: 800.0000 mg | ORAL_TABLET | Freq: Every day | ORAL | Status: DC

## 2012-07-29 NOTE — Progress Notes (Signed)
  Subjective:    Patient ID: [name], male    DOB: 02/29/1981, 32 y.o.   MRN: [id number]  HPI He comes in here for routine followup. He continues to take Prezista, Norvir and Truvada. He has no significant issues with the medication and in fact feels really well. No recent emergency room visits or hospitalizations. He reports 100% compliance no new issues. He also has blood pressure and does take his blood pressure medications, though it is elevated today. He denies any headaches, weight loss no recent hospitalizations. He feels well overall.  He did recently get a lot of teeth pulled.    Review of Systems  Constitutional: Negative for fever, appetite change, fatigue and unexpected weight change.  HENT: Negative for sore throat and trouble swallowing.   Respiratory: Negative for shortness of breath.   Cardiovascular: Negative for leg swelling.  Gastrointestinal: Negative for nausea, abdominal pain and diarrhea.  Musculoskeletal: Negative for myalgias, joint swelling and arthralgias.  Skin: Negative for rash.  Neurological: Negative for dizziness and headaches.  Psychiatric/Behavioral: The patient is not nervous/anxious.        Objective:   Physical Exam  Constitutional: He appears well-developed and well-nourished. No distress.  HENT:  Mouth/Throat: Oropharynx is clear and moist. No oropharyngeal exudate.  Cardiovascular: Normal rate, regular rhythm and normal heart sounds.  Exam reveals no gallop and no friction rub.   No murmur heard. Pulmonary/Chest: Effort normal and breath sounds normal. No respiratory distress. He has no wheezes. He has no rales.  Abdominal: Soft. Bowel sounds are normal. He exhibits no distension. There is no tenderness. There is no rebound.          Assessment & Plan:

## 2012-07-29 NOTE — Assessment & Plan Note (Signed)
His blood pressure is a little elevated today. I will have him come back in 3 months to recheck his blood pressure. I discussed diet modifications including reducing salt and most importantly I discussed smoking cessation. He is pre-contemplative at this time

## 2012-07-29 NOTE — Assessment & Plan Note (Signed)
He is doing well with the continued suppressed viral load and CD4 count.  He will return in 3 months for fasting labs.

## 2012-08-18 ENCOUNTER — Emergency Department (HOSPITAL_COMMUNITY): Payer: Medicaid Other

## 2012-08-18 ENCOUNTER — Encounter (HOSPITAL_COMMUNITY): Payer: Self-pay

## 2012-08-18 ENCOUNTER — Inpatient Hospital Stay (HOSPITAL_COMMUNITY)
Admission: EM | Admit: 2012-08-18 | Discharge: 2012-09-06 | DRG: 463 | Disposition: A | Discharge status: Rehabilitation Facility | Payer: Medicaid Other | Attending: General Surgery | Admitting: General Surgery

## 2012-08-18 DIAGNOSIS — S12100A Unspecified displaced fracture of second cervical vertebra, initial encounter for closed fracture: Secondary | ICD-10-CM

## 2012-08-18 DIAGNOSIS — S0101XA Laceration without foreign body of scalp, initial encounter: Secondary | ICD-10-CM

## 2012-08-18 DIAGNOSIS — D75829 Heparin-induced thrombocytopenia, unspecified: Secondary | ICD-10-CM | POA: Diagnosis not present

## 2012-08-18 DIAGNOSIS — D7582 Heparin induced thrombocytopenia (HIT): Secondary | ICD-10-CM | POA: Diagnosis not present

## 2012-08-18 DIAGNOSIS — F17200 Nicotine dependence, unspecified, uncomplicated: Secondary | ICD-10-CM | POA: Diagnosis present

## 2012-08-18 DIAGNOSIS — S02839A Fracture of medial orbital wall, unspecified side, initial encounter for closed fracture: Secondary | ICD-10-CM

## 2012-08-18 DIAGNOSIS — S82109A Unspecified fracture of upper end of unspecified tibia, initial encounter for closed fracture: Principal | ICD-10-CM | POA: Diagnosis present

## 2012-08-18 DIAGNOSIS — Z91199 Patient's noncompliance with other medical treatment and regimen due to unspecified reason: Secondary | ICD-10-CM

## 2012-08-18 DIAGNOSIS — E876 Hypokalemia: Secondary | ICD-10-CM | POA: Diagnosis not present

## 2012-08-18 DIAGNOSIS — S61209A Unspecified open wound of unspecified finger without damage to nail, initial encounter: Secondary | ICD-10-CM | POA: Diagnosis present

## 2012-08-18 DIAGNOSIS — I7774 Dissection of vertebral artery: Secondary | ICD-10-CM | POA: Diagnosis present

## 2012-08-18 DIAGNOSIS — S42001A Fracture of unspecified part of right clavicle, initial encounter for closed fracture: Secondary | ICD-10-CM

## 2012-08-18 DIAGNOSIS — S060XAA Concussion with loss of consciousness status unknown, initial encounter: Secondary | ICD-10-CM

## 2012-08-18 DIAGNOSIS — I10 Essential (primary) hypertension: Secondary | ICD-10-CM | POA: Diagnosis present

## 2012-08-18 DIAGNOSIS — F1210 Cannabis abuse, uncomplicated: Secondary | ICD-10-CM | POA: Diagnosis present

## 2012-08-18 DIAGNOSIS — Z9119 Patient's noncompliance with other medical treatment and regimen: Secondary | ICD-10-CM

## 2012-08-18 DIAGNOSIS — E871 Hypo-osmolality and hyponatremia: Secondary | ICD-10-CM | POA: Diagnosis present

## 2012-08-18 DIAGNOSIS — S0280XA Fracture of other specified skull and facial bones, unspecified side, initial encounter for closed fracture: Secondary | ICD-10-CM | POA: Diagnosis present

## 2012-08-18 DIAGNOSIS — K5900 Constipation, unspecified: Secondary | ICD-10-CM | POA: Diagnosis not present

## 2012-08-18 DIAGNOSIS — S0100XA Unspecified open wound of scalp, initial encounter: Secondary | ICD-10-CM | POA: Diagnosis present

## 2012-08-18 DIAGNOSIS — Z79899 Other long term (current) drug therapy: Secondary | ICD-10-CM

## 2012-08-18 DIAGNOSIS — D62 Acute posthemorrhagic anemia: Secondary | ICD-10-CM | POA: Diagnosis not present

## 2012-08-18 DIAGNOSIS — B20 Human immunodeficiency virus [HIV] disease: Secondary | ICD-10-CM | POA: Diagnosis present

## 2012-08-18 DIAGNOSIS — S82143A Displaced bicondylar fracture of unspecified tibia, initial encounter for closed fracture: Secondary | ICD-10-CM

## 2012-08-18 DIAGNOSIS — S41109A Unspecified open wound of unspecified upper arm, initial encounter: Secondary | ICD-10-CM | POA: Diagnosis present

## 2012-08-18 DIAGNOSIS — S060X9A Concussion with loss of consciousness of unspecified duration, initial encounter: Secondary | ICD-10-CM

## 2012-08-18 DIAGNOSIS — S36113A Laceration of liver, unspecified degree, initial encounter: Secondary | ICD-10-CM

## 2012-08-18 DIAGNOSIS — F1010 Alcohol abuse, uncomplicated: Secondary | ICD-10-CM | POA: Diagnosis present

## 2012-08-18 DIAGNOSIS — S42023A Displaced fracture of shaft of unspecified clavicle, initial encounter for closed fracture: Secondary | ICD-10-CM | POA: Diagnosis present

## 2012-08-18 DIAGNOSIS — IMO0002 Reserved for concepts with insufficient information to code with codable children: Secondary | ICD-10-CM

## 2012-08-18 HISTORY — DX: Fracture of medial orbital wall, unspecified side, initial encounter for closed fracture: S02.839A

## 2012-08-18 HISTORY — DX: Human immunodeficiency virus (HIV) disease: B20

## 2012-08-18 HISTORY — DX: Asymptomatic human immunodeficiency virus (hiv) infection status: Z21

## 2012-08-18 HISTORY — DX: Unspecified displaced fracture of second cervical vertebra, initial encounter for closed fracture: S12.100A

## 2012-08-18 LAB — POCT I-STAT, CHEM 8
BUN: 8 mg/dL (ref 6–23)
Calcium, Ion: 1.15 mmol/L (ref 1.12–1.23)
Chloride: 105 mEq/L (ref 96–112)
Creatinine, Ser: 1.3 mg/dL (ref 0.50–1.35)
Glucose, Bld: 139 mg/dL — ABNORMAL HIGH (ref 70–99)
HCT: 45 % (ref 39.0–52.0)
Hemoglobin: 15.3 g/dL (ref 13.0–17.0)
Potassium: 2.9 mEq/L — ABNORMAL LOW (ref 3.5–5.1)
Sodium: 141 mEq/L (ref 135–145)
TCO2: 21 mmol/L (ref 0–100)

## 2012-08-18 LAB — CG4 I-STAT (LACTIC ACID): Lactic Acid, Venous: 4.36 mmol/L — ABNORMAL HIGH (ref 0.5–2.2)

## 2012-08-18 LAB — CBC
HCT: 40.9 % (ref 39.0–52.0)
Hemoglobin: 14 g/dL (ref 13.0–17.0)
MCV: 92.5 fL (ref 78.0–100.0)
RBC: 4.42 MIL/uL (ref 4.22–5.81)
WBC: 8.8 10*3/uL (ref 4.0–10.5)

## 2012-08-18 LAB — TYPE AND SCREEN
ABO/RH(D): O POS
Antibody Screen: NEGATIVE
Unit division: 0
Unit division: 0

## 2012-08-18 LAB — COMPREHENSIVE METABOLIC PANEL
AST: 75 U/L — ABNORMAL HIGH (ref 0–37)
Alkaline Phosphatase: 54 U/L (ref 39–117)
BUN: 9 mg/dL (ref 6–23)
CO2: 20 mEq/L (ref 19–32)
Chloride: 102 mEq/L (ref 96–112)
Creatinine, Ser: 1.1 mg/dL (ref 0.50–1.35)
GFR calc non Af Amer: 87 mL/min — ABNORMAL LOW (ref >90)
Total Bilirubin: 0.4 mg/dL (ref 0.3–1.2)

## 2012-08-18 LAB — LIPASE, BLOOD: Lipase: 24 U/L (ref 11–59)

## 2012-08-18 LAB — ETHANOL: Alcohol, Ethyl (B): 164 mg/dL — ABNORMAL HIGH (ref 0–11)

## 2012-08-18 MED ORDER — TETANUS-DIPHTH-ACELL PERTUSSIS 5-2.5-18.5 LF-MCG/0.5 IM SUSP
0.5000 mL | Freq: Once | INTRAMUSCULAR | Status: DC
  Filled 2012-08-18: qty 0.5

## 2012-08-18 MED ORDER — FENTANYL CITRATE 0.05 MG/ML IJ SOLN
INTRAMUSCULAR | Status: AC
  Administered 2012-08-18: 50 ug via INTRAVENOUS
  Filled 2012-08-18: qty 2

## 2012-08-18 MED ORDER — IOHEXOL 300 MG/ML  SOLN
100.0000 mL | Freq: Once | INTRAMUSCULAR | Status: AC | PRN
  Administered 2012-08-18: 100 mL via INTRAVENOUS

## 2012-08-18 MED ORDER — FENTANYL CITRATE 0.05 MG/ML IJ SOLN
50.0000 ug | Freq: Once | INTRAMUSCULAR | Status: AC
  Administered 2012-08-18: 50 ug via INTRAVENOUS

## 2012-08-18 MED ORDER — FENTANYL CITRATE 0.05 MG/ML IJ SOLN: 50.0000 ug | Freq: Once | INTRAMUSCULAR | Status: AC

## 2012-08-18 NOTE — ED Notes (Signed)
Pt transported to CT via stretcher with this RN.

## 2012-08-18 NOTE — Progress Notes (Signed)
Orthopedic Tech Progress Note Patient Details:  [name] 01-24-1981 [id number]  Ortho Devices Type of Ortho Device: Knee Immobilizer   Haskell Flirt 08/18/2012, 11:58 PM

## 2012-08-18 NOTE — ED Notes (Addendum)
Pt. Presents with lac to right MCP joint, no crepitis noted, lac to right posterior forearm, swelling abrabsion and laceration to right tib/fib, abrasion and swelling to right cheek, abrasion to upper quadrant/rib. Pulses intact in all extremities. Pt. Responding to verbal stimulus.

## 2012-08-18 NOTE — ED Notes (Signed)
See trauma documentation 

## 2012-08-18 NOTE — ED Notes (Signed)
Pt transported to XR with Verlon Au EN

## 2012-08-18 NOTE — Progress Notes (Signed)
Orthopedic Tech Progress Note Patient Details:  [name] 05/20/1980 [id number]  Patient ID: [name], male   DOB: Jul 03, 1980, 32 y.o.   MRN: [id number] Made level 1 trauma visit  [name] 08/18/2012, 9:38 PM

## 2012-08-18 NOTE — ED Notes (Signed)
Pt. Returned from CT.
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
## 2012-08-19 ENCOUNTER — Encounter (HOSPITAL_COMMUNITY): Payer: Self-pay | Admitting: Radiology

## 2012-08-19 ENCOUNTER — Inpatient Hospital Stay (HOSPITAL_COMMUNITY): Payer: Medicaid Other

## 2012-08-19 DIAGNOSIS — S61209A Unspecified open wound of unspecified finger without damage to nail, initial encounter: Secondary | ICD-10-CM

## 2012-08-19 DIAGNOSIS — I10 Essential (primary) hypertension: Secondary | ICD-10-CM | POA: Diagnosis present

## 2012-08-19 DIAGNOSIS — S158XXA Injury of other specified blood vessels at neck level, initial encounter: Secondary | ICD-10-CM

## 2012-08-19 DIAGNOSIS — B20 Human immunodeficiency virus [HIV] disease: Secondary | ICD-10-CM | POA: Diagnosis present

## 2012-08-19 DIAGNOSIS — S82143A Displaced bicondylar fracture of unspecified tibia, initial encounter for closed fracture: Secondary | ICD-10-CM

## 2012-08-19 DIAGNOSIS — S36113A Laceration of liver, unspecified degree, initial encounter: Secondary | ICD-10-CM

## 2012-08-19 DIAGNOSIS — I7774 Dissection of vertebral artery: Secondary | ICD-10-CM

## 2012-08-19 DIAGNOSIS — S0100XA Unspecified open wound of scalp, initial encounter: Secondary | ICD-10-CM

## 2012-08-19 DIAGNOSIS — S129XXA Fracture of neck, unspecified, initial encounter: Secondary | ICD-10-CM

## 2012-08-19 DIAGNOSIS — S42009A Fracture of unspecified part of unspecified clavicle, initial encounter for closed fracture: Secondary | ICD-10-CM

## 2012-08-19 DIAGNOSIS — F1010 Alcohol abuse, uncomplicated: Secondary | ICD-10-CM | POA: Diagnosis present

## 2012-08-19 HISTORY — DX: Dissection of vertebral artery: I77.74

## 2012-08-19 LAB — HEPARIN LEVEL (UNFRACTIONATED): Heparin Unfractionated: <0.1 [IU]/mL — ABNORMAL LOW (ref 0.30–0.70)

## 2012-08-19 LAB — URINALYSIS, ROUTINE W REFLEX MICROSCOPIC
Bilirubin Urine: NEGATIVE
Glucose, UA: NEGATIVE mg/dL
Ketones, ur: 15 mg/dL — AB
Leukocytes, UA: NEGATIVE
Nitrite: NEGATIVE
Protein, ur: NEGATIVE mg/dL
Specific Gravity, Urine: 1.01 (ref 1.005–1.030)
Urobilinogen, UA: 1 mg/dL (ref 0.0–1.0)
pH: 6.5 (ref 5.0–8.0)

## 2012-08-19 LAB — ABO/RH: ABO/RH(D): O POS

## 2012-08-19 LAB — CBC
HCT: 40.8 % (ref 39.0–52.0)
HCT: 40.5 % (ref 39.0–52.0)
HCT: 38.5 % — ABNORMAL LOW (ref 39.0–52.0)
Hemoglobin: 13.3 g/dL (ref 13.0–17.0)
Hemoglobin: 13.6 g/dL (ref 13.0–17.0)
Hemoglobin: 12.8 g/dL — ABNORMAL LOW (ref 13.0–17.0)
MCH: 30.8 pg (ref 26.0–34.0)
MCH: 32 pg (ref 26.0–34.0)
MCHC: 32.6 g/dL (ref 30.0–36.0)
MCHC: 33.2 g/dL (ref 30.0–36.0)
MCV: 94.4 fL (ref 78.0–100.0)
MCV: 93.8 fL (ref 78.0–100.0)
MCV: 94.1 fL (ref 78.0–100.0)
Platelets: 307 10*3/uL (ref 150–400)
Platelets: 238 10*3/uL (ref 150–400)
RBC: 4.32 MIL/uL (ref 4.22–5.81)
RBC: 4.32 MIL/uL (ref 4.22–5.81)
RBC: 4.06 MIL/uL — ABNORMAL LOW (ref 4.22–5.81)
RDW: 12.1 % (ref 11.5–15.5)
WBC: 20 10*3/uL — ABNORMAL HIGH (ref 4.0–10.5)
WBC: 17.3 10*3/uL — ABNORMAL HIGH (ref 4.0–10.5)

## 2012-08-19 LAB — URINE MICROSCOPIC-ADD ON

## 2012-08-19 LAB — RAPID URINE DRUG SCREEN, HOSP PERFORMED
Amphetamines: NOT DETECTED
Barbiturates: NOT DETECTED
Benzodiazepines: NOT DETECTED
Cocaine: NOT DETECTED
Opiates: NOT DETECTED
Tetrahydrocannabinol: NOT DETECTED

## 2012-08-19 LAB — BASIC METABOLIC PANEL
BUN: 7 mg/dL (ref 6–23)
CO2: 22 mEq/L (ref 19–32)
Chloride: 100 mEq/L (ref 96–112)
Creatinine, Ser: 0.76 mg/dL (ref 0.50–1.35)
Glucose, Bld: 109 mg/dL — ABNORMAL HIGH (ref 70–99)

## 2012-08-19 MED ORDER — MIDAZOLAM HCL 2 MG/2ML IJ SOLN
INTRAMUSCULAR | Status: AC | PRN
  Administered 2012-08-19: 1 mg via INTRAVENOUS

## 2012-08-19 MED ORDER — POTASSIUM CHLORIDE IN NACL 20-0.9 MEQ/L-% IV SOLN
INTRAVENOUS | Status: DC
  Administered 2012-08-19 (×3): via INTRAVENOUS
  Administered 2012-08-20: 125 mL/h via INTRAVENOUS
  Administered 2012-08-20 – 2012-08-21 (×2): via INTRAVENOUS
  Administered 2012-08-21 (×2): 125 mL/h via INTRAVENOUS
  Filled 2012-08-19 (×15): qty 1000

## 2012-08-19 MED ORDER — CEFAZOLIN SODIUM-DEXTROSE 2-3 GM-% IV SOLR
2.0000 g | Freq: Once | INTRAVENOUS | Status: AC
  Administered 2012-08-19: 2 g via INTRAVENOUS
  Filled 2012-08-19: qty 50

## 2012-08-19 MED ORDER — MORPHINE SULFATE 2 MG/ML IJ SOLN
1.0000 mg | INTRAMUSCULAR | Status: DC | PRN
  Administered 2012-08-19 (×3): 2 mg via INTRAVENOUS
  Administered 2012-08-19: 4 mg via INTRAVENOUS
  Administered 2012-08-19: 2 mg via INTRAVENOUS
  Administered 2012-08-19: 4 mg via INTRAVENOUS
  Administered 2012-08-19 (×4): 2 mg via INTRAVENOUS
  Administered 2012-08-19: 4 mg via INTRAVENOUS
  Administered 2012-08-19 – 2012-08-20 (×4): 2 mg via INTRAVENOUS
  Administered 2012-08-20: 4 mg via INTRAVENOUS
  Administered 2012-08-20: 2 mg via INTRAVENOUS
  Filled 2012-08-19: qty 1
  Filled 2012-08-19: qty 2
  Filled 2012-08-19: qty 1
  Filled 2012-08-19: qty 2
  Filled 2012-08-19 (×9): qty 1
  Filled 2012-08-19 (×2): qty 2
  Filled 2012-08-19 (×2): qty 1

## 2012-08-19 MED ORDER — LORAZEPAM 2 MG/ML IJ SOLN
1.0000 mg | Freq: Four times a day (QID) | INTRAMUSCULAR | Status: AC | PRN
  Administered 2012-08-19 – 2012-08-21 (×5): 1 mg via INTRAVENOUS
  Filled 2012-08-19 (×5): qty 1

## 2012-08-19 MED ORDER — ONDANSETRON HCL 4 MG/2ML IJ SOLN: 4.0000 mg | Freq: Four times a day (QID) | INTRAMUSCULAR | Status: DC | PRN

## 2012-08-19 MED ORDER — SODIUM CHLORIDE 0.9 % IV SOLN
Freq: Once | INTRAVENOUS | Status: AC
  Administered 2012-08-19: 01:00:00 via INTRAVENOUS

## 2012-08-19 MED ORDER — HYDRALAZINE HCL 20 MG/ML IJ SOLN
INTRAMUSCULAR | Status: AC
  Filled 2012-08-19: qty 1

## 2012-08-19 MED ORDER — IOHEXOL 300 MG/ML  SOLN
150.0000 mL | Freq: Once | INTRAMUSCULAR | Status: AC | PRN
  Administered 2012-08-19: 1 mL via INTRAVENOUS

## 2012-08-19 MED ORDER — IOHEXOL 350 MG/ML SOLN
50.0000 mL | Freq: Once | INTRAVENOUS | Status: AC | PRN
  Administered 2012-08-19: 50 mL via INTRAVENOUS

## 2012-08-19 MED ORDER — LISINOPRIL 20 MG PO TABS
20.0000 mg | ORAL_TABLET | Freq: Every day | ORAL | Status: DC
  Administered 2012-08-19 – 2012-09-05 (×17): 20 mg via ORAL
  Filled 2012-08-19 (×19): qty 1

## 2012-08-19 MED ORDER — AMLODIPINE BESYLATE 5 MG PO TABS
5.0000 mg | ORAL_TABLET | Freq: Every day | ORAL | Status: DC
  Administered 2012-08-19 – 2012-09-05 (×18): 5 mg via ORAL
  Filled 2012-08-19 (×19): qty 1

## 2012-08-19 MED ORDER — PANTOPRAZOLE SODIUM 40 MG PO TBEC
40.0000 mg | DELAYED_RELEASE_TABLET | Freq: Every day | ORAL | Status: DC
  Administered 2012-08-20 – 2012-09-04 (×14): 40 mg via ORAL
  Filled 2012-08-19 (×14): qty 1

## 2012-08-19 MED ORDER — DIPHENHYDRAMINE HCL 50 MG/ML IJ SOLN
12.5000 mg | Freq: Four times a day (QID) | INTRAMUSCULAR | Status: DC | PRN
  Administered 2012-08-19: 12.5 mg via INTRAVENOUS
  Administered 2012-08-20 – 2012-08-27 (×5): 25 mg via INTRAVENOUS
  Filled 2012-08-19 (×5): qty 1

## 2012-08-19 MED ORDER — SODIUM CHLORIDE 0.9 % IV SOLN: Freq: Once | INTRAVENOUS | Status: DC

## 2012-08-19 MED ORDER — DARUNAVIR ETHANOLATE 800 MG PO TABS
800.0000 mg | ORAL_TABLET | Freq: Every day | ORAL | Status: DC
  Administered 2012-08-20 – 2012-09-06 (×17): 800 mg via ORAL
  Filled 2012-08-19 (×22): qty 1

## 2012-08-19 MED ORDER — ONDANSETRON HCL 4 MG PO TABS
4.0000 mg | ORAL_TABLET | Freq: Four times a day (QID) | ORAL | Status: DC | PRN
  Administered 2012-08-20: 4 mg via ORAL
  Filled 2012-08-19: qty 1

## 2012-08-19 MED ORDER — EMTRICITABINE-TENOFOVIR DF 200-300 MG PO TABS
1.0000 | ORAL_TABLET | Freq: Every day | ORAL | Status: DC
  Administered 2012-08-19 – 2012-09-05 (×17): 1 via ORAL
  Filled 2012-08-19 (×19): qty 1

## 2012-08-19 MED ORDER — HEPARIN (PORCINE) IN NACL 100-0.45 UNIT/ML-% IJ SOLN
850.0000 [IU]/h | INTRAMUSCULAR | Status: DC
  Administered 2012-08-19: 850 [IU]/h via INTRAVENOUS
  Filled 2012-08-19: qty 250

## 2012-08-19 MED ORDER — FENTANYL CITRATE 0.05 MG/ML IJ SOLN
INTRAMUSCULAR | Status: AC | PRN
  Administered 2012-08-19: 25 ug via INTRAVENOUS

## 2012-08-19 MED ORDER — HYDRALAZINE HCL 20 MG/ML IJ SOLN
INTRAMUSCULAR | Status: AC | PRN
  Administered 2012-08-19 (×4): 5 mg via INTRAVENOUS

## 2012-08-19 MED ORDER — LORAZEPAM 1 MG PO TABS
1.0000 mg | ORAL_TABLET | Freq: Four times a day (QID) | ORAL | Status: AC | PRN
  Administered 2012-08-20: 1 mg via ORAL
  Filled 2012-08-19: qty 1

## 2012-08-19 MED ORDER — RITONAVIR 100 MG PO TABS
100.0000 mg | ORAL_TABLET | Freq: Every day | ORAL | Status: DC
  Administered 2012-08-20 – 2012-09-05 (×16): 100 mg via ORAL
  Filled 2012-08-19 (×22): qty 1

## 2012-08-19 MED ORDER — DIPHENHYDRAMINE HCL 50 MG/ML IJ SOLN
INTRAMUSCULAR | Status: AC
  Filled 2012-08-19: qty 1

## 2012-08-19 MED ORDER — ADULT MULTIVITAMIN W/MINERALS CH
1.0000 | ORAL_TABLET | Freq: Every day | ORAL | Status: DC
  Administered 2012-08-20 – 2012-09-05 (×16): 1 via ORAL
  Filled 2012-08-19 (×19): qty 1

## 2012-08-19 MED ORDER — THIAMINE HCL 100 MG/ML IJ SOLN
100.0000 mg | Freq: Every day | INTRAMUSCULAR | Status: DC
  Administered 2012-08-19 – 2012-08-21 (×2): 100 mg via INTRAVENOUS
  Filled 2012-08-19 (×10): qty 1

## 2012-08-19 MED ORDER — FENTANYL CITRATE 0.05 MG/ML IJ SOLN
INTRAMUSCULAR | Status: AC
  Filled 2012-08-19: qty 4

## 2012-08-19 MED ORDER — MIDAZOLAM HCL 2 MG/2ML IJ SOLN
INTRAMUSCULAR | Status: AC
  Filled 2012-08-19: qty 4

## 2012-08-19 MED ORDER — LIDOCAINE HCL (PF) 1 % IJ SOLN
INTRAMUSCULAR | Status: AC
  Administered 2012-08-19: 10 mL
  Filled 2012-08-19: qty 10

## 2012-08-19 MED ORDER — PANTOPRAZOLE SODIUM 40 MG IV SOLR
40.0000 mg | Freq: Every day | INTRAVENOUS | Status: DC
  Administered 2012-08-19 – 2012-08-21 (×2): 40 mg via INTRAVENOUS
  Filled 2012-08-19 (×5): qty 40

## 2012-08-19 MED ORDER — VITAMIN B-1 100 MG PO TABS
100.0000 mg | ORAL_TABLET | Freq: Every day | ORAL | Status: DC
  Administered 2012-08-20 – 2012-09-05 (×16): 100 mg via ORAL
  Filled 2012-08-19 (×19): qty 1

## 2012-08-19 MED ORDER — FOLIC ACID 1 MG PO TABS
1.0000 mg | ORAL_TABLET | Freq: Every day | ORAL | Status: DC
  Administered 2012-08-20 – 2012-09-05 (×16): 1 mg via ORAL
  Filled 2012-08-19 (×19): qty 1
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
## 2012-08-19 NOTE — ED Notes (Signed)
Pt transported to CT then to 2300 via stretcher by [employer]

## 2012-08-19 NOTE — Progress Notes (Signed)
Per Dr. Andrey Campanile made sure that C-collar was on. Patient neck is midline, verified by 2 RNs.
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
## 2012-08-19 NOTE — ED Notes (Addendum)
O2 2l/[place] applied.  Little verbal response.  Procedure explained.  Verbalized understanding.  Encouraged to speak when spoken to during procedure so that we may evaluate neuro status.  Verbalizes understanding.
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
## 2012-08-19 NOTE — Procedures (Addendum)
Pre-operative Diagnosis:right 3rd digit laceration Post-operative Diagnosis:as above Procedures: irrigation and excisional surgical debridement of subcutaneous tissue Surgeon: Ashok Norris, ANP-BC Assist:  Charma Igo, PA-C Indications: suspected foreign body, laceration Technique, risks, benefits, alternatives discussed. The patient expressed understanding & wished to proceed.  The procedure, risks and complications have been discussed in detail (including, but not limited to airway compromise, infection, bleeding) with the patient, and the patient has signed consent to the procedure.  Findings:  Laceration extending to subcutaneous tissue without evidence of foreign body  Anesthesia: Lidocaine 1% local  Fluids:none EBL: 5ml Drains:none Specimens: none Complications:  no immediate complications were noted Condition:  Tolerated the procedure well and stable Procedure Details  The area was prepped with betadine.I sterilely injected a digital block of local aesthetic (1% lidocaine66mL total infused).  The patient tolerated the procedure well. I used sharp dissection with scalpel and scissors to remove necrotic tissue which extender to the subcutaneous tissue which appeared  healthy and vascularized tissue. The area was approximately 2cm in size.   Wound was irrigated with sterile NS.  5cc blood loss.  Hemostasis was achieved with pressure.  I packed the wound with clean gauze & dry dressing.  The patient tolerated the procedure well.       Ashok Norris, [facility] Pager [phone]

## 2012-08-19 NOTE — Progress Notes (Addendum)
Paged Dr. Andrey Campanile to inform him that collar was tightened and centered but patient is very noncompliant and does not keep his head centered. MD aware  Explained to patient the importance of keeping his head centered and the importance of being compliant and listening to medical/nursing staff.  Will continue to provide support, comfort, and will monitor patient.

## 2012-08-19 NOTE — Progress Notes (Signed)
Spoke with IR about angio planned.  Will possibly alter therapy with stent or prolonged anticoagulation.  Ortho and Neuro have not seen the patient yet.  This patient has been seen and I agree with the findings and treatment plan.  Marta Lamas. Gae Bon, MD, FACS [phone] (pager) [phone] (direct pager) Trauma Surgeon

## 2012-08-19 NOTE — Progress Notes (Signed)
Patient took cervical collar off witnessed by myself and transporter, Ellwood Dense. Collar placed back on. He has been told the importance of the cervical collar being left on.  Verbalized understanding. Oriented X 4  Complete neuro assessment done at this time.  No changes.

## 2012-08-19 NOTE — ED Notes (Signed)
O2 d'c'd.  Exoseal closure by Lynann Beaver RT

## 2012-08-19 NOTE — Progress Notes (Signed)
Patient has to consistently be reminded to keep his head straight and still, it has been explained to him on several different occassions.  Will try to provide support and comfort
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
## 2012-08-19 NOTE — Procedures (Signed)
S/P 4 vessel cerebral arteriogram  Lt CFA approach Findings  1. NON flow limiting focal dissection of LT VA at C2 associated with filling defect ,probably representing a thrombus.

## 2012-08-19 NOTE — Consult Note (Signed)
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 imaging of the head, cervical spine, and maxillofacial structures were performed using the standard protocol without intravenous contrast. Multiplanar CT image reconstructions of the cervical spine and maxillofacial structures were also generated.  Comparison:   None  CT HEAD  Findings: There is no evidence for acute hemorrhage, hydrocephalus, mass lesion, or abnormal extra-axial fluid collection.  No definite CT evidence for acute infarction.  The right frontal scalp laceration.  No underlying calvarial fracture.  IMPRESSION: Right frontal scalp laceration.  No underlying  calvarial fracture or acute intracranial abnormality.  CT MAXILLOFACIAL  Findings:  Globes are symmetric.  The lenses are located.  No retrobulbar hematoma.  Hypertrophic changes along the maxillary sinus walls may reflect sequelae of chronic sinusitis or remote trauma.  There is a fracture of the medial orbital wall on the left.  No associated hematoma, therefore favored to be remote however correlate clinically.  Paranasal sinuses and mastoid air cells are otherwise predominately clear.  Temporomandibular  joints are located.  Absent right maxillary central and lateral incisor and central and lateral mandibular dentition, age indeterminate.  Nasal bones and nasal septum intact.  Intact pterygoid plates and zygomatic arches.  IMPRESSION: Left medial orbital wall fracture is not favored to be acute however correlate clinically.  Multiple absent dentition may be post-traumatic.  Correlate clinically.  CT CERVICAL SPINE  Findings:   There is a type 2 dens fracture with mild displacement. Intact craniocervical junction. Vertically oriented linear lucency through the lateral mass C1 on coronal image 9 and 10 of 32 may reflect a small chip fracture. Separate from the type 2 dens fracture, there is a fracture involving the transverse process of C2 on the left, which extends into the transverse foramen as seen on sagittal image 25/34. On the right, a fracture through the lateral aspect of the vertebral body extends just below the foramen through the transverse process as seen on sagittal image 12. Vertebral body height and alignment otherwise maintained.  The lung apices clear. Partially imaged right clavicle fracture.  IMPRESSION: Type 2 dens fracture with mild displacement.  Separate fracture involving C2 laterally, extends into the left transverse foramen.  Question small chip fracture of the left lateral mass C1.  Right clavicle fracture.  Critical Value/emergent results were called by telephone at the time of interpretation on 08/18/2012 at 10:20 p.m. to Dr. Andrey Campanile in person, who verbally acknowledged these results.   Original Report Authenticated By: Jearld Lesch, M.D.     Review of Systems  Constitutional: Negative.   Eyes: Negative.   Endo/Heme/Allergies:       Pos HIV   Blood pressure 156/90, pulse 103, temperature 98.2 F (36.8 C), temperature source Oral, resp. rate 17, height 5\' 8"  (1.727 m), weight 68.04 kg (150 lb), SpO2 99.00%. Physical Exam  Neck:  collar  Cardiovascular: Normal rate.    Respiratory: Effort normal.  GI: Soft.  Musculoskeletal: He exhibits edema and tenderness.  Right elbow abrasion with dressing. Right clavicle tenderness NVI upper extremity  Sensation of dorsal and plantar foot right    Assessment/Plan:   Get CT scan Tuesday and can proceed with ORIF Wed.  Would fix clavicle if pt stable and does well.   Jehan Ranganathan C 08/19/2012, 6:54 PM

## 2012-08-19 NOTE — Progress Notes (Signed)
UR completed 

## 2012-08-19 NOTE — Progress Notes (Signed)
Orthopedic Tech Progress Note Patient Details:  [name] [birth year] [id number] Applied arm sling to RUE. Ortho Devices Type of Ortho Device: Arm sling Ortho Device/Splint Location: RUE Ortho Device/Splint Interventions: Application   [name] 08/19/2012, 9:46 AM
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
## 2012-08-20 ENCOUNTER — Inpatient Hospital Stay (HOSPITAL_COMMUNITY): Payer: Medicaid Other

## 2012-08-20 ENCOUNTER — Encounter (HOSPITAL_COMMUNITY): Payer: Self-pay | Admitting: General Surgery

## 2012-08-20 LAB — CBC
MCH: 31.6 pg (ref 26.0–34.0)
MCV: 94.4 fL (ref 78.0–100.0)
MCV: 94.5 fL (ref 78.0–100.0)
Platelets: 200 10*3/uL (ref 150–400)
Platelets: 182 10*3/uL (ref 150–400)
RBC: 3.73 MIL/uL — ABNORMAL LOW (ref 4.22–5.81)
RBC: 3.63 MIL/uL — ABNORMAL LOW (ref 4.22–5.81)
WBC: 10 10*3/uL (ref 4.0–10.5)

## 2012-08-20 LAB — BASIC METABOLIC PANEL
CO2: 23 mEq/L (ref 19–32)
Calcium: 9.1 mg/dL (ref 8.4–10.5)
GFR calc Af Amer: >90 mL/min (ref >90)
Sodium: 132 mEq/L — ABNORMAL LOW (ref 135–145)

## 2012-08-20 LAB — HEPARIN LEVEL (UNFRACTIONATED): Heparin Unfractionated: <0.1 [IU]/mL — ABNORMAL LOW (ref 0.30–0.70)

## 2012-08-20 MED ORDER — OXYCODONE HCL 5 MG PO TABS
10.0000 mg | ORAL_TABLET | ORAL | Status: DC | PRN
  Administered 2012-08-20: 10 mg via ORAL
  Administered 2012-08-20 – 2012-08-21 (×5): 20 mg via ORAL
  Administered 2012-08-22: 10 mg via ORAL
  Administered 2012-08-22 (×2): 20 mg via ORAL
  Administered 2012-08-23 (×3): 15 mg via ORAL
  Administered 2012-08-23: 10 mg via ORAL
  Administered 2012-08-23 – 2012-08-24 (×2): 15 mg via ORAL
  Administered 2012-08-24 (×3): 10 mg via ORAL
  Administered 2012-08-25 (×2): 20 mg via ORAL
  Administered 2012-08-25: 10 mg via ORAL
  Administered 2012-08-25 (×2): 20 mg via ORAL
  Administered 2012-08-25: 10 mg via ORAL
  Administered 2012-08-26 – 2012-08-31 (×26): 20 mg via ORAL
  Administered 2012-08-31: 15 mg via ORAL
  Administered 2012-08-31: 20 mg via ORAL
  Administered 2012-08-31 – 2012-09-01 (×3): 15 mg via ORAL
  Administered 2012-09-01 (×2): 10 mg via ORAL
  Administered 2012-09-01: 15 mg via ORAL
  Administered 2012-09-01: 5 mg via ORAL
  Administered 2012-09-02 – 2012-09-03 (×7): 10 mg via ORAL
  Administered 2012-09-03: 20 mg via ORAL
  Administered 2012-09-03 (×2): 10 mg via ORAL
  Administered 2012-09-04 – 2012-09-06 (×13): 20 mg via ORAL
  Filled 2012-08-20: qty 2
  Filled 2012-08-20 (×4): qty 4
  Filled 2012-08-20 (×2): qty 2
  Filled 2012-08-20: qty 4
  Filled 2012-08-20 (×3): qty 2
  Filled 2012-08-20 (×2): qty 4
  Filled 2012-08-20: qty 3
  Filled 2012-08-20 (×2): qty 4
  Filled 2012-08-20: qty 2
  Filled 2012-08-20: qty 3
  Filled 2012-08-20 (×7): qty 4
  Filled 2012-08-20: qty 2
  Filled 2012-08-20: qty 3
  Filled 2012-08-20 (×2): qty 4
  Filled 2012-08-20: qty 2
  Filled 2012-08-20 (×2): qty 4
  Filled 2012-08-20: qty 2
  Filled 2012-08-20 (×3): qty 4
  Filled 2012-08-20: qty 2
  Filled 2012-08-20 (×4): qty 4
  Filled 2012-08-20: qty 2
  Filled 2012-08-20: qty 4
  Filled 2012-08-20: qty 2
  Filled 2012-08-20 (×2): qty 4
  Filled 2012-08-20: qty 3
  Filled 2012-08-20 (×2): qty 4
  Filled 2012-08-20: qty 2
  Filled 2012-08-20: qty 4
  Filled 2012-08-20: qty 2
  Filled 2012-08-20 (×4): qty 4
  Filled 2012-08-20: qty 1
  Filled 2012-08-20: qty 3
  Filled 2012-08-20 (×2): qty 2
  Filled 2012-08-20 (×5): qty 4
  Filled 2012-08-20 (×2): qty 2
  Filled 2012-08-20 (×3): qty 4
  Filled 2012-08-20: qty 2
  Filled 2012-08-20 (×3): qty 4
  Filled 2012-08-20: qty 3
  Filled 2012-08-20: qty 4
  Filled 2012-08-20: qty 3
  Filled 2012-08-20 (×2): qty 4
  Filled 2012-08-20: qty 3
  Filled 2012-08-20 (×2): qty 4

## 2012-08-20 MED ORDER — HYDROMORPHONE HCL PF 1 MG/ML IJ SOLN
1.0000 mg | INTRAMUSCULAR | Status: DC | PRN
  Administered 2012-08-20 – 2012-08-22 (×9): 1 mg via INTRAVENOUS
  Filled 2012-08-20 (×9): qty 1

## 2012-08-20 MED ORDER — HEPARIN (PORCINE) IN NACL 100-0.45 UNIT/ML-% IJ SOLN
1300.0000 [IU]/h | INTRAMUSCULAR | Status: DC
  Administered 2012-08-20 (×2): 1050 [IU]/h via INTRAVENOUS
  Administered 2012-08-21: 1300 [IU]/h via INTRAVENOUS
  Filled 2012-08-20: qty 250

## 2012-08-20 MED ORDER — OXYCODONE HCL 5 MG PO TABS: 10.0000 mg | ORAL_TABLET | ORAL | Status: DC | PRN

## 2012-08-20 MED ORDER — SPIRITUS FRUMENTI
1.0000 | Freq: Three times a day (TID) | ORAL | Status: DC
  Administered 2012-08-22 – 2012-08-26 (×12): 1 via ORAL
  Filled 2012-08-20 (×24): qty 1

## 2012-08-20 NOTE — Progress Notes (Addendum)
Patient ID: [name], male   DOB: 01-14-1981, 32 y.o.   MRN: [id number]  [name] is cleared from trauma standpoint with respect to C2 fracture.  I am currently awaiting callback from Dr. Franky Macho to 1. Place a note in chart or 2. Give verbal clearance and I will simply make an addendum as requested by anesthesia.   1452-Spoke with Dr. Franky Macho, pt okay to proceed for surgery.  Ashok Norris, ANP-BC Pager [phone]

## 2012-08-20 NOTE — Progress Notes (Signed)
Patient ID: [name], male   DOB: 01/22/1981, 32 y.o.   MRN: [id number] BP 149/94  Pulse 109  Temp(Src) 100.1 F (37.8 C) (Oral)  Resp 14  Ht 5\' 8"  (1.727 m)  Wt 68.04 kg (150 lb)  BMI 22.81 kg/m2  SpO2 97% Alert and oriented. OK for surgery, prefer he be intubated with the collar in place. If this is not possible then in line traction to the head must be applied.  Will follow after surgery for limb fracture.
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
## 2012-08-20 NOTE — Progress Notes (Signed)
Patient becoming very agitated/restless, pulling off C-Collar regardless of being instructed not to; pulling off orthotic devices, attempting to swing legs around to get OOB, c/o itching; tachypnea and tachycardia noted, BP increased, patient yelling, and threatening to leave AMA. Cristy Hilts, trauma PA notified of CIWA score. New orders received. Ativan given as ordered. Safety maintained.

## 2012-08-20 NOTE — Progress Notes (Signed)
Patient seen and examined.  Agree with PA's note.  

## 2012-08-20 NOTE — Progress Notes (Signed)
I was asked to speak with the pt 2/2 to the fact that he continues to remove his C-collar.  I discussed with the pt the severity of his injury and the need to con't with C-collar to avoid permanent paralysis and intubation.  He states he understood and would keep it on.

## 2012-08-20 NOTE — Progress Notes (Signed)
Reported off to oncoming RN. Safety maintained. No acute distress noted.

## 2012-08-20 NOTE — Progress Notes (Signed)
Turned patient to change sheets, all precautions were used, head was stabilized by one RN while others log-rolled patient.  Patient was in pain but was stable, old sheets did have some blood on them, was dark and did not appear new or bright, paged MD (wyatt and spoke to np), told to continue hepain.  H/H stable, VVS stable

## 2012-08-20 NOTE — Progress Notes (Signed)
Patient noted to have removed C-Collar, orthotic device, and right arm sling. Patient continues to be very  NONCOMPLIANT. C-Collar and right arm sling placed back on patient; patient once again instructed on the importance of keeping the C-Collar in place. Patient laughs and states that he does not care. Patient is extremely non-compliant regardless of multiple instruction and warnings.

## 2012-08-20 NOTE — Progress Notes (Signed)
OT Cancellation Note  Patient Details Name: [name] MRN: [id number] DOB: [birth year]   Cancelled Treatment:     Pt gone for CT, splint lying on bedside table.  Sign posted in room with splint wear and care instructions.  Will follow up in morning.   Jeani Hawking M [phone] 08/20/2012, 3:03 PM
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
## 2012-08-20 NOTE — Progress Notes (Signed)
Note:  CT of knee completed as ordered. Patient was very agitated in CT scan, cursing loudly and attempting to pull off C-Collar. Patient instructed of the importance of his C-Collar. Patient states "I don't care!"  Patient brought back to ICU room 2306 via bed, on monitor. Safety maintained. Patient refuses to be turned, refuses oral care,and refuses bath. No acute distress noted. Safety maintained.
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
## 2012-08-20 NOTE — Progress Notes (Signed)
Patient ID: [name], male   DOB: 05/17/1980, 32 y.o.   MRN: [id number] CT scan shows comminuted medial and lateral plateau fracture with displacement and depression. Will require surgery for correction  .  Will do tibia fixation first and then proceed with clavicle fixation .   Patient noncompliant with collar , removes it despite discussion with him by me as well as nurse and I am sure Dr. Cabbell has discussed with him as well.  His care is compomised by his noncompliance and his treatment result may be poor despite health care team best efforts in his behalf. Risks of surgery, infection, nonunion,compartment syndrome and  reoperation has been discussed as well as discussion on importance of compliance. Will go over with him again tomorrow.  

## 2012-08-20 NOTE — Progress Notes (Signed)
Occupational Therapy Treatment Patient Details Name: [name] MRN: [id number] DOB: 06-Mar-1981 Today's Date: 08/20/2012 Time: 7829-5621 OT Time Calculation (min): 21 min  OT Assessment / Plan / Recommendation Comments on Treatment Session  Splint checked.  Arrived and pt. With cervical collar off, splint off, and sling off.  Pt anxious, complaining of itching, RR 51.  RN notified.  Collar, sling and splint reapplied.  No signs of skin integrity issues Rt. Hand, but splint has not been on consistently per RN, so will check it again in 2 hours.  Pt. Shared that he has a h/o Mental retardation, and has been on disability since he was a child.  He does work at [employer].     Follow Up Recommendations  Other (comment)    Barriers to Discharge  Other (comment) (uncertain)    Equipment Recommendations  Other (comment)    Recommendations for Other Services    Frequency Min 2X/week   Plan Discharge plan remains appropriate    Precautions / Restrictions Precautions Precautions: Cervical;Other (comment) Precaution Comments: OT order for extension splint Rt. middle finger Restrictions Weight Bearing Restrictions: Yes RUE Weight Bearing: Non weight bearing RLE Weight Bearing: Non weight bearing   Pertinent Vitals/Pain     ADL  ADL Comments: OT checked on splint.  Pt in bed with cervical collar off, splint off, sling off, complaining of itching, anxiety, RR 51. RN notified.  Collar, sling, splint re-applied.  RN states he has had the splint off multiple times.  Upon futher questioning of pt. he reports h/o Mental retardation and is on disability due to this.  He works part time,  and lives in a motel.      OT Diagnosis: Acute pain;Generalized weakness  OT Problem List: Decreased strength;Decreased range of motion;Impaired UE functional use;Other (comment) (splinting) OT Treatment Interventions: Self-care/ADL training;Patient/family education;Other (comment);Splinting    OT Goals Acute Rehab OT Goals OT Goal Formulation: Patient unable to participate in goal setting Time For Goal Achievement: 08/28/12 Potential to Achieve Goals: Fair ADL Goals Additional ADL Goal #1: Pt/caregiver will be independent with splint wear and care ADL Goal: Additional Goal #1 - Progress: Progressing toward goals  Visit Information  Last OT Received On: 08/20/12    Subjective Data  Subjective: "that feels good"  Re: splint Patient Stated Goal: Pt unable to state due to lethargy   Prior Functioning  Home Living Additional Comments: Pt unable to provide info due to lethargy Prior Function Level of Independence: Independent Dominant Hand: Right    Cognition  Cognition Arousal/Alertness: Awake/alert Behavior During Therapy: Anxious Overall Cognitive Status: No family/caregiver present to determine baseline cognitive functioning (pt with h/o MR) Difficult to assess due to: Level of arousal (Pt is oriented)    Mobility       Exercises      Balance     End of Session OT - End of Session Activity Tolerance: Treatment limited secondary to agitation Patient left: in bed;with call bell/phone within reach;with nursing in room Nurse Communication: Other (comment) (see comments above)  GO     Emmalynne Courtney M 08/20/2012, 1:01 PM
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
## 2012-08-20 NOTE — Progress Notes (Signed)
Trauma MD aware of neurosurgery's rounding note from tonight. Pt is continuing to reach for c-collar and trying to remove it. Pt was educated on the seriousness of his neck injuries and he stated "I don't care". Pt remains noncompliant. Pt has been receiving scheduled PRN pain medication for his pain as well as given scheduled PRN ativan per orders. Trauma MD to come by and see patient and reinforce severity of injuries.

## 2012-08-20 NOTE — Consult Note (Signed)
Chart reviewed for scheduled surgery tomorrow - 5/14 per Dr. Ophelia Charter.  Patient presents with concerns for anesthetic management related to possible cervical spine instability and cerebral perfusion. The desire to proceed to the OR do not seem to be addressed by trauma or neurosurgery. I feel the patient's care would be benefited by a note clarifying recommendations for timing and intra-op care for repair of fractures.  I will attempt to contact Dr. Ophelia Charter with these concerns.  Aldona Lento, MD

## 2012-08-21 ENCOUNTER — Encounter (HOSPITAL_COMMUNITY): Admission: EM | Disposition: A | Discharge status: Rehabilitation Facility | Payer: Self-pay | Source: Home / Self Care

## 2012-08-21 ENCOUNTER — Inpatient Hospital Stay (HOSPITAL_COMMUNITY): Payer: Medicaid Other

## 2012-08-21 ENCOUNTER — Encounter (HOSPITAL_COMMUNITY): Payer: Self-pay | Admitting: Anesthesiology

## 2012-08-21 ENCOUNTER — Inpatient Hospital Stay (HOSPITAL_COMMUNITY): Payer: Medicaid Other | Admitting: Anesthesiology

## 2012-08-21 HISTORY — PX: ORIF TIBIA PLATEAU: SHX2132

## 2012-08-21 HISTORY — PX: ORIF CLAVICULAR FRACTURE: SHX5055

## 2012-08-21 LAB — BASIC METABOLIC PANEL
BUN: 7 mg/dL (ref 6–23)
CO2: 24 mEq/L (ref 19–32)
Chloride: 98 mEq/L (ref 96–112)
Creatinine, Ser: 0.82 mg/dL (ref 0.50–1.35)
GFR calc Af Amer: >90 mL/min (ref >90)
GFR calc non Af Amer: >90 mL/min (ref >90)
Glucose, Bld: 108 mg/dL — ABNORMAL HIGH (ref 70–99)
Glucose, Bld: 90 mg/dL (ref 70–99)
Potassium: 4.1 mEq/L (ref 3.5–5.1)
Potassium: 4.6 mEq/L (ref 3.5–5.1)
Sodium: 131 mEq/L — ABNORMAL LOW (ref 135–145)

## 2012-08-21 LAB — CBC
HCT: 32 % — ABNORMAL LOW (ref 39.0–52.0)
HCT: 32.3 % — ABNORMAL LOW (ref 39.0–52.0)
HCT: 31.1 % — ABNORMAL LOW (ref 39.0–52.0)
HCT: 30.8 % — ABNORMAL LOW (ref 39.0–52.0)
Hemoglobin: 11 g/dL — ABNORMAL LOW (ref 13.0–17.0)
Hemoglobin: 10.8 g/dL — ABNORMAL LOW (ref 13.0–17.0)
Hemoglobin: 10.5 g/dL — ABNORMAL LOW (ref 13.0–17.0)
MCH: 31.5 pg (ref 26.0–34.0)
MCH: 31.8 pg (ref 26.0–34.0)
MCHC: 34.4 g/dL (ref 30.0–36.0)
MCHC: 33.8 g/dL (ref 30.0–36.0)
MCV: 94.2 fL (ref 78.0–100.0)
MCV: 94.2 fL (ref 78.0–100.0)
Platelets: 152 10*3/uL (ref 150–400)
RBC: 3.41 MIL/uL — ABNORMAL LOW (ref 4.22–5.81)
RBC: 3.43 MIL/uL — ABNORMAL LOW (ref 4.22–5.81)
RBC: 3.33 MIL/uL — ABNORMAL LOW (ref 4.22–5.81)
RBC: 3.27 MIL/uL — ABNORMAL LOW (ref 4.22–5.81)
RDW: 11.4 % — ABNORMAL LOW (ref 11.5–15.5)
WBC: 9.6 10*3/uL (ref 4.0–10.5)
WBC: 8.7 10*3/uL (ref 4.0–10.5)
WBC: 9 10*3/uL (ref 4.0–10.5)

## 2012-08-21 SURGERY — OPEN REDUCTION INTERNAL FIXATION (ORIF) TIBIAL PLATEAU
Anesthesia: General | Site: Leg Lower | Laterality: Right | Wound class: Clean

## 2012-08-21 SURGERY — Surgical Case
Anesthesia: *Unknown

## 2012-08-21 MED ORDER — HEPARIN (PORCINE) IN NACL 100-0.45 UNIT/ML-% IJ SOLN
1300.0000 [IU]/h | INTRAMUSCULAR | Status: DC
  Filled 2012-08-21 (×2): qty 250

## 2012-08-21 MED ORDER — METHOCARBAMOL 500 MG PO TABS
500.0000 mg | ORAL_TABLET | Freq: Four times a day (QID) | ORAL | Status: DC | PRN
  Administered 2012-08-22 – 2012-09-06 (×36): 500 mg via ORAL
  Filled 2012-08-21 (×37): qty 1

## 2012-08-21 MED ORDER — CEFAZOLIN SODIUM 1-5 GM-% IV SOLN
INTRAVENOUS | Status: AC
  Filled 2012-08-21: qty 100

## 2012-08-21 MED ORDER — CEFAZOLIN SODIUM 1-5 GM-% IV SOLN
1.0000 g | Freq: Four times a day (QID) | INTRAVENOUS | Status: AC
  Administered 2012-08-21 – 2012-08-22 (×2): 1 g via INTRAVENOUS
  Filled 2012-08-21 (×2): qty 50

## 2012-08-21 MED ORDER — ARTIFICIAL TEARS OP OINT
TOPICAL_OINTMENT | OPHTHALMIC | Status: DC | PRN
  Administered 2012-08-21: 1 via OPHTHALMIC

## 2012-08-21 MED ORDER — ROCURONIUM BROMIDE 100 MG/10ML IV SOLN
INTRAVENOUS | Status: DC | PRN
  Administered 2012-08-21 (×2): 50 mg via INTRAVENOUS

## 2012-08-21 MED ORDER — HEPARIN (PORCINE) IN NACL 100-0.45 UNIT/ML-% IJ SOLN
3600.0000 [IU]/h | INTRAMUSCULAR | Status: DC
  Administered 2012-08-21: 1300 [IU]/h via INTRAVENOUS
  Administered 2012-08-22: 1750 [IU]/h via INTRAVENOUS
  Administered 2012-08-22: 2050 [IU]/h via INTRAVENOUS
  Administered 2012-08-23 (×2): 2350 [IU]/h via INTRAVENOUS
  Administered 2012-08-24: 2700 [IU]/h via INTRAVENOUS
  Administered 2012-08-24: 2350 [IU]/h via INTRAVENOUS
  Administered 2012-08-24: 2700 [IU]/h via INTRAVENOUS
  Administered 2012-08-25 (×2): 3200 [IU]/h via INTRAVENOUS
  Administered 2012-08-25: 3000 [IU]/h via INTRAVENOUS
  Administered 2012-08-26: 3700 [IU]/h via INTRAVENOUS
  Administered 2012-08-26: 3600 [IU]/h via INTRAVENOUS
  Administered 2012-08-26: 3500 [IU]/h via INTRAVENOUS
  Administered 2012-08-27 (×2): 3600 [IU]/h via INTRAVENOUS
  Filled 2012-08-21 (×24): qty 250

## 2012-08-21 MED ORDER — ONDANSETRON HCL 4 MG/2ML IJ SOLN
INTRAMUSCULAR | Status: DC | PRN
  Administered 2012-08-21: 4 mg via INTRAVENOUS

## 2012-08-21 MED ORDER — PHENYLEPHRINE HCL 10 MG/ML IJ SOLN
10.0000 mg | INTRAVENOUS | Status: DC | PRN
  Administered 2012-08-21: 25 ug/min via INTRAVENOUS

## 2012-08-21 MED ORDER — HYDROMORPHONE HCL PF 1 MG/ML IJ SOLN: 0.2500 mg | INTRAMUSCULAR | Status: DC | PRN

## 2012-08-21 MED ORDER — SENNOSIDES-DOCUSATE SODIUM 8.6-50 MG PO TABS
1.0000 | ORAL_TABLET | Freq: Every evening | ORAL | Status: DC | PRN
  Filled 2012-08-21: qty 1

## 2012-08-21 MED ORDER — PROPOFOL 10 MG/ML IV BOLUS
INTRAVENOUS | Status: DC | PRN
  Administered 2012-08-21: 170 mg via INTRAVENOUS
  Administered 2012-08-21: 30 mg via INTRAVENOUS

## 2012-08-21 MED ORDER — CEFAZOLIN SODIUM 1-5 GM-% IV SOLN
INTRAVENOUS | Status: AC
  Filled 2012-08-21: qty 50

## 2012-08-21 MED ORDER — ACETAMINOPHEN 10 MG/ML IV SOLN: 1000.0000 mg | Freq: Once | INTRAVENOUS | Status: DC | PRN

## 2012-08-21 MED ORDER — DOCUSATE SODIUM 100 MG PO CAPS
100.0000 mg | ORAL_CAPSULE | Freq: Two times a day (BID) | ORAL | Status: DC
  Administered 2012-08-22 – 2012-08-26 (×9): 100 mg via ORAL
  Filled 2012-08-21 (×12): qty 1

## 2012-08-21 MED ORDER — METHOCARBAMOL 100 MG/ML IJ SOLN
500.0000 mg | Freq: Four times a day (QID) | INTRAVENOUS | Status: DC | PRN
  Filled 2012-08-21: qty 5

## 2012-08-21 MED ORDER — LACTATED RINGERS IV SOLN
INTRAVENOUS | Status: DC
  Administered 2012-08-21: 15:00:00 via INTRAVENOUS
  Administered 2012-08-21: 20 mL/h via INTRAVENOUS

## 2012-08-21 MED ORDER — COCAINE HCL 4 % EX SOLN
CUTANEOUS | Status: AC
  Filled 2012-08-21: qty 4

## 2012-08-21 MED ORDER — MIDAZOLAM HCL 5 MG/5ML IJ SOLN
INTRAMUSCULAR | Status: DC | PRN
  Administered 2012-08-21: 2 mg via INTRAVENOUS

## 2012-08-21 MED ORDER — CEFAZOLIN SODIUM 1-5 GM-% IV SOLN
INTRAVENOUS | Status: DC | PRN
  Administered 2012-08-21 (×3): 1 g via INTRAVENOUS

## 2012-08-21 MED ORDER — ONDANSETRON HCL 4 MG/2ML IJ SOLN: 4.0000 mg | Freq: Once | INTRAMUSCULAR | Status: DC | PRN

## 2012-08-21 MED ORDER — LACTATED RINGERS IV SOLN
INTRAVENOUS | Status: DC | PRN
  Administered 2012-08-21 (×3): via INTRAVENOUS

## 2012-08-21 MED ORDER — 0.9 % SODIUM CHLORIDE (POUR BTL) OPTIME
TOPICAL | Status: DC | PRN
  Administered 2012-08-21: 1000 mL

## 2012-08-21 MED ORDER — HYDROMORPHONE HCL PF 1 MG/ML IJ SOLN
INTRAMUSCULAR | Status: AC
  Administered 2012-08-21: 0.5 mg via INTRAVENOUS
  Filled 2012-08-21: qty 1

## 2012-08-21 MED ORDER — FLEET ENEMA 7-19 GM/118ML RE ENEM
1.0000 | ENEMA | Freq: Once | RECTAL | Status: AC | PRN
  Filled 2012-08-21: qty 1

## 2012-08-21 MED ORDER — MIDAZOLAM HCL 2 MG/2ML IJ SOLN: 1.0000 mg | INTRAMUSCULAR | Status: DC | PRN

## 2012-08-21 MED ORDER — FENTANYL CITRATE 0.05 MG/ML IJ SOLN
INTRAMUSCULAR | Status: DC | PRN
  Administered 2012-08-21: 100 ug via INTRAVENOUS
  Administered 2012-08-21 (×2): 50 ug via INTRAVENOUS

## 2012-08-21 MED ORDER — CEFAZOLIN SODIUM 1-5 GM-% IV SOLN
1.0000 g | Freq: Four times a day (QID) | INTRAVENOUS | Status: DC
  Filled 2012-08-21 (×2): qty 50

## 2012-08-21 MED ORDER — FENTANYL CITRATE 0.05 MG/ML IJ SOLN: 50.0000 ug | Freq: Once | INTRAMUSCULAR | Status: DC

## 2012-08-21 MED ORDER — NEOSTIGMINE METHYLSULFATE 1 MG/ML IJ SOLN
INTRAMUSCULAR | Status: DC | PRN
Start: 2012-08-21 — End: 2012-08-21
  Administered 2012-08-21: 3 mg via INTRAVENOUS

## 2012-08-21 MED ORDER — GLYCOPYRROLATE 0.2 MG/ML IJ SOLN
INTRAMUSCULAR | Status: DC | PRN
  Administered 2012-08-21: 0.4 mg via INTRAVENOUS

## 2012-08-21 MED ORDER — BISACODYL 10 MG RE SUPP
10.0000 mg | Freq: Every day | RECTAL | Status: DC | PRN
  Administered 2012-08-30: 10 mg via RECTAL
  Filled 2012-08-21: qty 1

## 2012-08-21 MED ORDER — CEFAZOLIN SODIUM 1-5 GM-% IV SOLN: 1.0000 g | Freq: Four times a day (QID) | INTRAVENOUS | Status: DC

## 2012-08-21 MED ORDER — DEXMEDETOMIDINE HCL IN NACL 200 MCG/50ML IV SOLN
INTRAVENOUS | Status: DC | PRN
  Administered 2012-08-21: 0.7 ug/kg/h via INTRAVENOUS

## 2012-08-21 MED ORDER — HEPARIN SODIUM (PORCINE) 5000 UNIT/ML IJ SOLN: 5000.0000 [IU] | Freq: Three times a day (TID) | INTRAMUSCULAR | Status: DC

## 2012-08-21 SURGICAL SUPPLY — 123 items
BANDAGE ELASTIC 4 VELCRO ST LF (GAUZE/BANDAGES/DRESSINGS) ×1 IMPLANT
BANDAGE ELASTIC 6 VELCRO ST LF (GAUZE/BANDAGES/DRESSINGS) ×2 IMPLANT
BANDAGE ESMARK 6X9 LF (GAUZE/BANDAGES/DRESSINGS) IMPLANT
BANDAGE GAUZE ELAST BULKY 4 IN (GAUZE/BANDAGES/DRESSINGS) IMPLANT
BIT DRILL 100X2.5XANTM LCK (BIT) IMPLANT
BIT DRILL 2.5X2.75 QC CALB (BIT) ×1 IMPLANT
BIT DRILL CAL (BIT) IMPLANT
BIT DRILL CALIBRATED 2.7 (BIT) ×1 IMPLANT
BIT DRL 100X2.5XANTM LCK (BIT) ×2
BLADE SURG 10 STRL SS (BLADE) ×2 IMPLANT
BLADE SURG ROTATE 9660 (MISCELLANEOUS) ×2 IMPLANT
BNDG CMPR 9X6 STRL LF SNTH (GAUZE/BANDAGES/DRESSINGS) ×2
BNDG COHESIVE 4X5 TAN STRL (GAUZE/BANDAGES/DRESSINGS) ×1 IMPLANT
BNDG COHESIVE 6X5 TAN STRL LF (GAUZE/BANDAGES/DRESSINGS) ×1 IMPLANT
BNDG ESMARK 6X9 LF (GAUZE/BANDAGES/DRESSINGS) ×3
BONE CANC CHIPS 20CC PCAN1/4 (Bone Implant) ×3 IMPLANT
CHIPS CANC BONE 20CC PCAN1/4 (Bone Implant) ×2 IMPLANT
CLEANER TIP ELECTROSURG 2X2 (MISCELLANEOUS) ×1 IMPLANT
CLOTH BEACON ORANGE TIMEOUT ST (SAFETY) ×3 IMPLANT
COLLAR CERV LO CONTOUR FIRM DE (SOFTGOODS) ×1 IMPLANT
COVER MAYO STAND STRL (DRAPES) ×2 IMPLANT
COVER SURGICAL LIGHT HANDLE (MISCELLANEOUS) ×4 IMPLANT
CUFF TOURNIQUET SINGLE 34IN LL (TOURNIQUET CUFF) ×1 IMPLANT
CUFF TOURNIQUET SINGLE 44IN (TOURNIQUET CUFF) IMPLANT
DRAPE C-ARM 42X72 X-RAY (DRAPES) ×3 IMPLANT
DRAPE INCISE IOBAN 66X45 STRL (DRAPES) ×4 IMPLANT
DRAPE ORTHO SPLIT 77X108 STRL (DRAPES) ×6
DRAPE SURG 17X23 STRL (DRAPES) ×3 IMPLANT
DRAPE SURG ORHT 6 SPLT 77X108 (DRAPES) IMPLANT
DRAPE U-SHAPE 47X51 STRL (DRAPES) ×4 IMPLANT
DRILL BIT 2.5MM (BIT) ×3
DRILL BIT CAL (BIT) ×3
DRSG ADAPTIC 3X8 NADH LF (GAUZE/BANDAGES/DRESSINGS) ×1 IMPLANT
DRSG EMULSION OIL 3X3 NADH (GAUZE/BANDAGES/DRESSINGS) ×2 IMPLANT
DRSG PAD ABDOMINAL 8X10 ST (GAUZE/BANDAGES/DRESSINGS) ×1 IMPLANT
DURAPREP 26ML APPLICATOR (WOUND CARE) ×4 IMPLANT
ELECT CAUTERY BLADE 6.4 (BLADE) ×1 IMPLANT
ELECT REM PT RETURN 9FT ADLT (ELECTROSURGICAL) ×3
ELECTRODE REM PT RTRN 9FT ADLT (ELECTROSURGICAL) ×2 IMPLANT
EVACUATOR 1/8 PVC DRAIN (DRAIN) ×2 IMPLANT
GAUZE XEROFORM 5X9 LF (GAUZE/BANDAGES/DRESSINGS) ×1 IMPLANT
GLOVE BIOGEL PI IND STRL 7.5 (GLOVE) ×2 IMPLANT
GLOVE BIOGEL PI IND STRL 8 (GLOVE) ×2 IMPLANT
GLOVE BIOGEL PI INDICATOR 7.5 (GLOVE) ×1
GLOVE BIOGEL PI INDICATOR 8 (GLOVE) ×2
GLOVE ECLIPSE 7.0 STRL STRAW (GLOVE) ×3 IMPLANT
GLOVE ORTHO TXT STRL SZ7.5 (GLOVE) ×5 IMPLANT
GLOVE SURG SS PI 8.0 STRL IVOR (GLOVE) ×1 IMPLANT
GOWN PREVENTION PLUS LG XLONG (DISPOSABLE) ×1 IMPLANT
GOWN PREVENTION PLUS XLARGE (GOWN DISPOSABLE) ×4 IMPLANT
GOWN SRG XL XLNG 56XLVL 4 (GOWN DISPOSABLE) ×2 IMPLANT
GOWN STRL NON-REIN LRG LVL3 (GOWN DISPOSABLE) ×5 IMPLANT
GOWN STRL NON-REIN XL XLG LVL4 (GOWN DISPOSABLE) ×6
GRAFT BNE CANC CHIPS 1-8 20CC (Bone Implant) IMPLANT
HOOD PEEL AWAY FACE SHEILD DIS (HOOD) ×2 IMPLANT
IMMOBILIZER KNEE 22  40 CIR (ORTHOPEDIC SUPPLIES) ×1
IMMOBILIZER KNEE 22 40 CIR (ORTHOPEDIC SUPPLIES) IMPLANT
K-WIRE ACE 1.6X6 (WIRE) ×6
KIT BASIN OR (CUSTOM PROCEDURE TRAY) ×3 IMPLANT
KIT ROOM TURNOVER OR (KITS) ×3 IMPLANT
KWIRE ACE 1.6X6 (WIRE) IMPLANT
MANIFOLD NEPTUNE II (INSTRUMENTS) ×3 IMPLANT
NDL HYPO 25GX1X1/2 BEV (NEEDLE) IMPLANT
NDL HYPO 25X1 1.5 SAFETY (NEEDLE) ×2 IMPLANT
NEEDLE HYPO 25GX1X1/2 BEV (NEEDLE) IMPLANT
NEEDLE HYPO 25X1 1.5 SAFETY (NEEDLE) IMPLANT
NS IRRIG 1000ML POUR BTL (IV SOLUTION) ×3 IMPLANT
PACK ORTHO EXTREMITY (CUSTOM PROCEDURE TRAY) ×3 IMPLANT
PACK SHOULDER (CUSTOM PROCEDURE TRAY) ×1 IMPLANT
PAD ARMBOARD 7.5X6 YLW CONV (MISCELLANEOUS) ×6 IMPLANT
PAD CAST 4YDX4 CTTN HI CHSV (CAST SUPPLIES) IMPLANT
PADDING CAST COTTON 4X4 STRL (CAST SUPPLIES) ×3
PADDING CAST COTTON 6X4 STRL (CAST SUPPLIES) ×1 IMPLANT
PLATE LOCK 7H STD RT PROX TIB (Plate) ×1 IMPLANT
PLATE LOCK RT LG 85X10.7X2.4X9 (Plate) IMPLANT
PLATE LOCK RT LRG (Plate) ×3 IMPLANT
SCREW CORT 3.5X26 (Screw) IMPLANT
SCREW CORT T15 24X3.5XST LCK (Screw) IMPLANT
SCREW CORT T15 26X3.5XST LCK (Screw) IMPLANT
SCREW CORTICAL 3.5MM  34MM (Screw) ×1 IMPLANT
SCREW CORTICAL 3.5MM  46MM (Screw) ×1 IMPLANT
SCREW CORTICAL 3.5MM 34MM (Screw) IMPLANT
SCREW CORTICAL 3.5MM 36MM (Screw) ×3 IMPLANT
SCREW CORTICAL 3.5MM 46MM (Screw) IMPLANT
SCREW CORTICAL 3.5MM 48MM (Screw) IMPLANT
SCREW CORTICAL 3.5X24MM (Screw) ×6 IMPLANT
SCREW LOCK 3.5X16 DIST TIB (Screw) ×1 IMPLANT
SCREW LOCK 3.5X18 DIST TIB (Screw) ×1 IMPLANT
SCREW LOCK CORT STAR 3.5X16 (Screw) ×2 IMPLANT
SCREW LOCK CORT STAR 3.5X20 (Screw) ×2 IMPLANT
SCREW LOCK CORT STAR 3.5X50 (Screw) ×1 IMPLANT
SCREW LOCK CORT STAR 3.5X60 (Screw) ×1 IMPLANT
SCREW LOCK CORT STAR 3.5X65 (Screw) ×1 IMPLANT
SCREW LOCK CORT STAR 3.5X70 (Screw) ×4 IMPLANT
SCREW LOCK CORT STAR 3.5X75 (Screw) ×1 IMPLANT
SCREW LOW PROFILE 22MMX3.5MM (Screw) ×2 IMPLANT
SCREW LP 3.5X70MM (Screw) IMPLANT
SCREW LP 3.5X75MM (Screw) ×1 IMPLANT
SPONGE GAUZE 4X4 12PLY (GAUZE/BANDAGES/DRESSINGS) ×3 IMPLANT
SPONGE LAP 18X18 X RAY DECT (DISPOSABLE) ×6 IMPLANT
STAPLER VISISTAT 35W (STAPLE) ×3 IMPLANT
STOCKINETTE IMPERVIOUS 9X36 MD (GAUZE/BANDAGES/DRESSINGS) ×1 IMPLANT
STOCKINETTE IMPERVIOUS LG (DRAPES) ×3 IMPLANT
STRIP CLOSURE SKIN 1/2X4 (GAUZE/BANDAGES/DRESSINGS) ×2 IMPLANT
SUCTION FRAZIER TIP 10 FR DISP (SUCTIONS) ×3 IMPLANT
SUT ETHIBOND 2 0 V5 (SUTURE) ×2 IMPLANT
SUT ETHILON 2 0 FS 18 (SUTURE) ×1 IMPLANT
SUT PROLENE 3 0 PS 1 (SUTURE) ×2 IMPLANT
SUT VIC AB 0 CT1 27 (SUTURE) ×3
SUT VIC AB 0 CT1 27XBRD ANBCTR (SUTURE) ×2 IMPLANT
SUT VIC AB 1 CT1 27 (SUTURE) ×3
SUT VIC AB 1 CT1 27XBRD ANBCTR (SUTURE) ×2 IMPLANT
SUT VIC AB 2-0 CT1 27 (SUTURE) ×3
SUT VIC AB 2-0 CT1 TAPERPNT 27 (SUTURE) ×2 IMPLANT
SUT VICRYL 0 CT 1 36IN (SUTURE) ×2 IMPLANT
SYR CONTROL 10ML LL (SYRINGE) ×2 IMPLANT
TAPE CLOTH SURG 4X10 WHT LF (GAUZE/BANDAGES/DRESSINGS) ×1 IMPLANT
TOWEL OR 17X24 6PK STRL BLUE (TOWEL DISPOSABLE) ×3 IMPLANT
TOWEL OR 17X26 10 PK STRL BLUE (TOWEL DISPOSABLE) ×4 IMPLANT
TUBE CONNECTING 12X1/4 (SUCTIONS) ×4 IMPLANT
WASHER 3.5MM (Orthopedic Implant) ×2 IMPLANT
WATER STERILE IRR 1000ML POUR (IV SOLUTION) ×4 IMPLANT
YANKAUER SUCT BULB TIP NO VENT (SUCTIONS) ×3 IMPLANT

## 2012-08-21 NOTE — Anesthesia Postprocedure Evaluation (Signed)
Anesthesia Post Note  Patient: [name]  Procedure(s) Performed: Procedure(s) (LRB): OPEN REDUCTION INTERNAL FIXATION (ORIF) RIGHT LATERAL TIBIAL PLATEAU (Right) OPEN REDUCTION INTERNAL FIXATION (ORIF) CLAVICULAR FRACTURE (Right)  Anesthesia type: general  Patient location: PACU  Post pain: Pain level controlled  Post assessment: Patient's Cardiovascular Status Stable  Last Vitals:  Filed Vitals:   08/21/12 1930  BP: 100/50  Pulse: 72  Temp:   Resp: 10    Post vital signs: Reviewed and stable  Level of consciousness: sedated  Complications: No apparent anesthesia complications

## 2012-08-21 NOTE — Anesthesia Procedure Notes (Signed)
Procedure Name: Intubation Date/Time: 08/21/2012 3:45 PM Performed by: Leona Singleton A Pre-anesthesia Checklist: Patient identified Patient Re-evaluated:Patient Re-evaluated prior to inductionOxygen Delivery Method: Circle system utilized Preoxygenation: Pre-oxygenation with 100% oxygen Intubation Type: IV induction Nasal Tubes: Right and Nasal prep performed Tube size: 7.5 mm Number of attempts: 1 Airway Equipment and Method: Fiberoptic brochoscope Placement Confirmation: ETT inserted through vocal cords under direct vision,  positive ETCO2 and breath sounds checked- equal and bilateral Tube secured with: Tape Dental Injury: Teeth and Oropharynx as per pre-operative assessment  Difficulty Due To: Difficulty was anticipated, Difficult Airway-  due to neck instability and Difficult Airway- due to cervical collar Future Recommendations: Recommend- awake intubation Comments: Awake nasal fiberoptic intubation by Dr Noreene Larsson. Please refer to his quick note
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
## 2012-08-21 NOTE — Progress Notes (Signed)
Trauma Service Note  Subjective: Patient is to have surgery today on his right Tib-fib and left clavicle.  DT's bad last night  Objective: Vital signs in last 24 hours: Temp:  [97.5 F (36.4 C)-100.1 F (37.8 C)] 98.8 F (37.1 C) (05/14 0743) Pulse Rate:  [91-115] 91 (05/14 0700) Resp:  [11-25] 11 (05/14 0700) BP: (118-176)/(61-104) 133/69 mmHg (05/14 0700) SpO2:  [94 %-98 %] 95 % (05/14 0700)    Intake/Output from previous day: 05/13 0701 - 05/14 0700 In: 3246.3 [P.O.:120; I.V.:3126.3] Out: 2245 [Urine:2245] Intake/Output this shift:    General: Talkative, no acute distress  Lungs: Clear  Abd: Benign, nontender  Extremities: Tender on right leg.  Neuro: Intact, patient claims to be mentally challenged.  Lab Results: CBC   Recent Labs  08/21/12 0127 08/21/12 0400  WBC 9.6 8.7  HGB 11.0* 10.8*  HCT 32.0* 32.3*  PLT 167 168   BMET  Recent Labs  08/20/12 0345 08/21/12 0400  NA 132* 130*  K 4.0 4.1  CL 99 98  CO2 23 24  GLUCOSE 117* 108*  BUN 6 7  CREATININE 0.75 0.82  CALCIUM 9.1 8.9   PT/INR  Recent Labs  08/19/12 1234  LABPROT 12.7  INR 0.96   ABG No results found for this basename: PHART, PCO2, PO2, HCO3,  in the last 72 hours  Studies/Results: Ct Knee Right Wo Contrast  08/20/2012   *RADIOLOGY REPORT*  Clinical Data: Acute proximal tibia fracture.  CT OF THE RIGHT KNEE WITHOUT CONTRAST  Technique:  Multidetector CT imaging was performed according to the standard protocol. Multiplanar CT image reconstructions were also generated.  Comparison: Radiographs dated 08/18/2012  Findings: There is a markedly comminuted fracture of the proximal tibia involving the medial and lateral tibial plateaus and extending into the metaphysis with impaction of the metaphyseal fragments.  There is 3mm of depression of the posterior lateral aspect of the lateral tibial plateau.  The anterior aspect of the tibia is more impacted than the posterior aspect creating  angulation.  The fracture involves only a small portion of the posterior aspect of the medial tibial plateau.  There is a nondisplaced oblique fracture through the head of the fibula.  There is a prominent light bone hemarthrosis.  Cruciate ligaments are intact.  IMPRESSION: Comminuted impacted fractures of the medial and lateral tibial plateaus and proximal tibial metaphysis. Slight depression of the anterior lateral aspect of the lateral tibial plateau.   Displaced fracture of the fibular head.   Original Report Authenticated By: Francene Boyers, M.D.   Dg Finger Middle Right  08/19/2012   *RADIOLOGY REPORT*  Clinical Data: Finger injury.  RIGHT MIDDLE FINGER 2+V  Comparison: None.  Findings: Three-view exam of the right middle finger shows no fracture.  No subluxation or dislocation.  Study is slightly limited by the patient's inability to straighten the PIP joint.  No evidence for retained radiopaque soft tissue foreign body. Overlying bandage material is evident.  IMPRESSION: No acute bony abnormality.   Original Report Authenticated By: Kennith Center, M.D.    Anti-infectives: Anti-infectives   Start     Dose/Rate Route Frequency Ordered Stop   08/20/12 0830  Darunavir Ethanolate (PREZISTA) tablet 800 mg     800 mg Oral Daily with breakfast 08/19/12 1232     08/20/12 0830  ritonavir (NORVIR) tablet 100 mg     100 mg Oral Daily with breakfast 08/19/12 1232     08/19/12 1315  emtricitabine-tenofovir (TRUVADA) 200-300 MG per tablet 1  tablet     1 tablet Oral Daily 08/19/12 1232     08/19/12 1300  ceFAZolin (ANCEF) IVPB 2 g/50 mL premix     2 g 100 mL/hr over 30 Minutes Intravenous  Once 08/19/12 1151 08/19/12 1627      Assessment/Plan: s/p Procedure(s): OPEN REDUCTION INTERNAL FIXATION (ORIF) RIGHT LATERAL TIBIAL PLATEAU OPEN REDUCTION INTERNAL FIXATION (ORIF) CLAVICULAR FRACTURE NPO for surgery Will come back to ICU postoperatively. Stopp heparin at 10 AM  LOS: 3 days   Marta Lamas. Gae Bon, MD, FACS [phone] Trauma Surgeon 08/21/2012

## 2012-08-21 NOTE — Transfer of Care (Signed)
Immediate Anesthesia Transfer of Care Note  Patient: [name]  Procedure(s) Performed: Procedure(s): OPEN REDUCTION INTERNAL FIXATION (ORIF) RIGHT LATERAL TIBIAL PLATEAU (Right) OPEN REDUCTION INTERNAL FIXATION (ORIF) CLAVICULAR FRACTURE (Right)  Patient Location: PACU  Anesthesia Type:General  Level of Consciousness: patient cooperative  Airway & Oxygen Therapy: Patient Spontanous Breathing and Patient connected to face mask oxygen  Post-op Assessment: Report given to PACU RN and Post -op Vital signs reviewed and stable  Post vital signs: Reviewed and stable  Complications: No apparent anesthesia complications

## 2012-08-21 NOTE — Progress Notes (Signed)
After administering pain medication, pt was found 30 minutes later with c-collar off, sling off, and ortho device for right hand off. Pt refused to have the ortho device placed on his hand. C-collar was replaced with second nurse to hold head steady. Sling was reapplied and blood was noted around dressing on pt's elbow. Blood was frank red and pharmacy was notified due to the recent increase on the heparin gtt. Trauma MD was notified of new blood. Dressing was reinforced and sling was returned per order. Pt refused for me to change his gown or bed pad. Stated he "did not care" and "wanted to be left alone". Trauma MD wants to continue heparin infusion and restrain his left wrist to hopefully prevent him from removing ortho devices. Will continue to monitor.

## 2012-08-21 NOTE — Interval H&P Note (Signed)
History and Physical Interval Note:  08/21/2012 2:30 PM  [name]  has presented today for surgery, with the diagnosis of Right Tibial Plateau Fracture, Right Clavicle Fracture  The various methods of treatment have been discussed with the patient and family. After consideration of risks, benefits and other options for treatment, the patient has consented to  Procedure(s): OPEN REDUCTION INTERNAL FIXATION (ORIF) RIGHT LATERAL TIBIAL PLATEAU (Right) OPEN REDUCTION INTERNAL FIXATION (ORIF) CLAVICULAR FRACTURE (Right) as a surgical intervention .  The patient's history has been reviewed, patient examined, no change in status, stable for surgery.  I have reviewed the patient's chart and labs.  Questions were answered to the patient's satisfaction.     Annagrace Carr C

## 2012-08-21 NOTE — H&P (View-Only) (Signed)
Patient ID: [name], male   DOB: 1981/01/03, 32 y.o.   MRN: [id number] CT scan shows comminuted medial and lateral plateau fracture with displacement and depression. Will require surgery for correction  .  Will do tibia fixation first and then proceed with clavicle fixation .   Patient noncompliant with collar , removes it despite discussion with him by me as well as nurse and I am sure Dr. Franky Macho has discussed with him as well.  His care is compomised by his noncompliance and his treatment result may be poor despite health care team best efforts in his behalf. Risks of surgery, infection, nonunion,compartment syndrome and  reoperation has been discussed as well as discussion on importance of compliance. Will go over with him again tomorrow.

## 2012-08-21 NOTE — Preoperative (Signed)
Beta Blockers   Reason not to administer Beta Blockers:Not Applicable 
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
## 2012-08-22 LAB — CBC
HCT: 28.3 % — ABNORMAL LOW (ref 39.0–52.0)
Hemoglobin: 9.3 g/dL — ABNORMAL LOW (ref 13.0–17.0)
Hemoglobin: 9.5 g/dL — ABNORMAL LOW (ref 13.0–17.0)
Hemoglobin: 9.1 g/dL — ABNORMAL LOW (ref 13.0–17.0)
MCH: 31.4 pg (ref 26.0–34.0)
MCH: 31.8 pg (ref 26.0–34.0)
MCHC: 33.1 g/dL (ref 30.0–36.0)
MCV: 94.9 fL (ref 78.0–100.0)
MCV: 92 fL (ref 78.0–100.0)
Platelets: 165 10*3/uL (ref 150–400)
RBC: 2.86 MIL/uL — ABNORMAL LOW (ref 4.22–5.81)
WBC: 9.1 10*3/uL (ref 4.0–10.5)

## 2012-08-22 LAB — BASIC METABOLIC PANEL
BUN: 6 mg/dL (ref 6–23)
Chloride: 97 mEq/L (ref 96–112)
GFR calc Af Amer: >90 mL/min (ref >90)
Potassium: 4.3 mEq/L (ref 3.5–5.1)

## 2012-08-22 LAB — HEPARIN LEVEL (UNFRACTIONATED)
Heparin Unfractionated: <0.1 IU/mL — ABNORMAL LOW (ref 0.30–0.70)
Heparin Unfractionated: <0.1 [IU]/mL — ABNORMAL LOW (ref 0.30–0.70)

## 2012-08-22 MED ORDER — HYDROMORPHONE HCL PF 1 MG/ML IJ SOLN
1.0000 mg | INTRAMUSCULAR | Status: DC | PRN
  Administered 2012-08-22 (×2): 2 mg via INTRAVENOUS
  Administered 2012-08-23 – 2012-08-25 (×7): 1 mg via INTRAVENOUS
  Filled 2012-08-22: qty 2
  Filled 2012-08-22 (×2): qty 1
  Filled 2012-08-22: qty 2
  Filled 2012-08-22: qty 1
  Filled 2012-08-22: qty 2
  Filled 2012-08-22 (×3): qty 1

## 2012-08-22 MED ORDER — ADULT MULTIVITAMIN W/MINERALS CH: 1.0000 | ORAL_TABLET | Freq: Every day | ORAL | Status: DC

## 2012-08-22 MED ORDER — LORAZEPAM 2 MG/ML IJ SOLN
1.0000 mg | INTRAMUSCULAR | Status: AC | PRN
Start: 2012-08-22 — End: 2012-08-25

## 2012-08-22 MED ORDER — DEXMEDETOMIDINE HCL IN NACL 200 MCG/50ML IV SOLN
0.4000 ug/kg/h | INTRAVENOUS | Status: DC
  Administered 2012-08-22: 0.4 ug/kg/h via INTRAVENOUS
  Administered 2012-08-22: 0.7 ug/kg/h via INTRAVENOUS
  Filled 2012-08-22 (×2): qty 50

## 2012-08-22 MED ORDER — LORAZEPAM 1 MG PO TABS
1.0000 mg | ORAL_TABLET | ORAL | Status: AC | PRN
  Administered 2012-08-22 – 2012-08-24 (×3): 1 mg via ORAL
  Filled 2012-08-22 (×3): qty 1
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
## 2012-08-22 NOTE — Progress Notes (Signed)
Transferred to 5N31 via wheelchair accompanied by Diplomatic Services operational officer. No untoward event happened during transport. Receiving RN and NA at bedside.
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
## 2012-08-22 NOTE — Progress Notes (Signed)
Subjective: 1 Day Post-Op Procedure(s) (LRB): OPEN REDUCTION INTERNAL FIXATION (ORIF) RIGHT LATERAL TIBIAL PLATEAU (Right) OPEN REDUCTION INTERNAL FIXATION (ORIF) CLAVICULAR FRACTURE (Right) Patient reports pain as severe.  Resting comfortably but states he hurts "everywhere"  Objective: Vital signs in last 24 hours: Temp:  [97.8 F (36.6 C)-99.2 F (37.3 C)] 98.2 F (36.8 C) (05/15 0715) Pulse Rate:  [27-107] 75 (05/15 0800) Resp:  [8-22] 12 (05/15 0800) BP: (99-156)/(44-90) 142/81 mmHg (05/15 0800) SpO2:  [92 %-100 %] 100 % (05/15 0800)  Intake/Output from previous day: 05/14 0701 - 05/15 0700 In: 5402.2 [P.O.:50; I.V.:5252.2; IV Piggyback:100] Out: 2270 [Urine:2170; Blood:100] Intake/Output this shift: Total I/O In: 160.5 [I.V.:160.5] Out: -    Recent Labs  08/21/12 0127 08/21/12 0400 08/21/12 1145 08/21/12 2150 08/22/12 0347  HGB 11.0* 10.8* 10.5* 10.4* 9.3*    Recent Labs  08/21/12 2150 08/22/12 0347  WBC 9.0 10.1  RBC 3.27* 2.96*  HCT 30.8* 28.1*  PLT 152 165    Recent Labs  08/21/12 2150 08/22/12 0347  NA 131* 129*  K 4.6 4.3  CL 96 97  CO2 25 19  BUN 8 6  CREATININE 0.80 0.67  GLUCOSE 90 102*  CALCIUM 8.8 8.4    Recent Labs  08/19/12 1234  INR 0.96    Sensation intact distally Incision: dressing C/D/I Compartment soft Moving fingers well.  Will not move toes on right because of pain, but has good cap refill and sensation intact.   Encouraged him to move toes. Assessment/Plan: 1 Day Post-Op Procedure(s) (LRB): OPEN REDUCTION INTERNAL FIXATION (ORIF) RIGHT LATERAL TIBIAL PLATEAU (Right) OPEN REDUCTION INTERNAL FIXATION (ORIF) CLAVICULAR FRACTURE (Right) Up with therapy Being transferred to ortho floor today.  Will need dressing changes in couple of days to right clavicle and tibia. Continue ice packs to both.  Non weight bearing to right upper and lower extremity.  Peg Fifer M 08/22/2012, 8:46 AM

## 2012-08-22 NOTE — Progress Notes (Signed)
Patient ID: [name], male   DOB: February 08, 1981, 32 y.o.   MRN: [id number] BP 149/69  Pulse 97  Temp(Src) 98.6 F (37 C) (Oral)  Resp 14  Ht 5\' 8"  (1.727 m)  Wt 68.04 kg (150 lb)  BMI 22.81 kg/m2  SpO2 93% Alert and oriented x 4. [name] continues to remove his cervical collar frequently. He has no mental incapacities, he is not wearing his collar despite best medical advice.  He is moving all extremities well.  [name] is not compliant with my instructions, and I fear he will prolong his recovery needlessly.
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
## 2012-08-22 NOTE — Progress Notes (Signed)
UR completed 

## 2012-08-22 NOTE — Progress Notes (Signed)
Trauma Service Note  Subjective: Patient is on a Precedex drip.  CIWA withdrawal protocol has been discontinued.  Objective: Vital signs in last 24 hours: Temp:  [97.8 F (36.6 C)-99.2 F (37.3 C)] 98.2 F (36.8 C) (05/15 0715) Pulse Rate:  [25-107] 78 (05/15 0715) Resp:  [8-22] 12 (05/15 0715) BP: (99-156)/(44-90) 142/68 mmHg (05/15 0715) SpO2:  [89 %-100 %] 99 % (05/15 0715)    Intake/Output from previous day: 05/14 0701 - 05/15 0700 In: 5402.2 [P.O.:50; I.V.:5252.2; IV Piggyback:100] Out: 2270 [Urine:2170; Blood:100] Intake/Output this shift:    General: Pain whenever he is touched on his right side.  Even his toes hurt  Lungs: Clear to auscultation  Abd: Distended mildly, butgood bowel sounds  Extremities: Right LE in splint with ACE wrap.  Good perfusion and pulses  Neuro: Intact  Lab Results: CBC   Recent Labs  08/21/12 2150 08/22/12 0347  WBC 9.0 10.1  HGB 10.4* 9.3*  HCT 30.8* 28.1*  PLT 152 165   BMET  Recent Labs  08/21/12 2150 08/22/12 0347  NA 131* 129*  K 4.6 4.3  CL 96 97  CO2 25 19  GLUCOSE 90 102*  BUN 8 6  CREATININE 0.80 0.67  CALCIUM 8.8 8.4   PT/INR  Recent Labs  08/19/12 1234  LABPROT 12.7  INR 0.96   ABG No results found for this basename: PHART, PCO2, PO2, HCO3,  in the last 72 hours  Studies/Results: Dg Clavicle Right  08/21/2012   *RADIOLOGY REPORT*  Clinical Data: ORIF right clavicle.  RIGHT CLAVICLE - 2+ VIEWS  Comparison: Chest x-ray 08/18/2012  Findings: Plate and screw fixation across the mid right clavicle fracture.  Near anatomic alignment.  No complicating feature.  IMPRESSION: Plate and screw fixation of the mid clavicle fracture.   Original Report Authenticated By: Charlett Nose, M.D.   Dg Tibia/fibula Right  08/21/2012   *RADIOLOGY REPORT*  Clinical Data: ORIF right tibial plateau fracture.  RIGHT TIBIA AND FIBULA - 2 VIEW  Comparison: CT scan 08/20/2012  Findings: Four intraoperative films are submitted.   The patient is status post lateral plate screw fixation.  Gross anatomic reduction was achieved.  The knee joint appears to be intact. There is no radiographic evidence for complication.  IMPRESSION:  1.  Status post ORIF with lateral plate screw fixation of a comminuted tibial plateau fracture. 2.  Gross anatomic alignment is achieved.   Original Report Authenticated By: Marin Roberts, M.D.   Ct Knee Right Wo Contrast  08/20/2012   *RADIOLOGY REPORT*  Clinical Data: Acute proximal tibia fracture.  CT OF THE RIGHT KNEE WITHOUT CONTRAST  Technique:  Multidetector CT imaging was performed according to the standard protocol. Multiplanar CT image reconstructions were also generated.  Comparison: Radiographs dated 08/18/2012  Findings: There is a markedly comminuted fracture of the proximal tibia involving the medial and lateral tibial plateaus and extending into the metaphysis with impaction of the metaphyseal fragments.  There is 3mm of depression of the posterior lateral aspect of the lateral tibial plateau.  The anterior aspect of the tibia is more impacted than the posterior aspect creating angulation.  The fracture involves only a small portion of the posterior aspect of the medial tibial plateau.  There is a nondisplaced oblique fracture through the head of the fibula.  There is a prominent light bone hemarthrosis.  Cruciate ligaments are intact.  IMPRESSION: Comminuted impacted fractures of the medial and lateral tibial plateaus and proximal tibial metaphysis. Slight depression of the anterior  lateral aspect of the lateral tibial plateau.   Displaced fracture of the fibular head.   Original Report Authenticated By: Francene Boyers, M.D.   Dg C-arm 61-120 Min-no Report  08/21/2012   CLINICAL DATA: ORIF R Tibial Plateau and ORIF R Clavicle   C-ARM 61-120 MINUTES  Fluoroscopy was utilized by the requesting physician.  No radiographic  interpretation.     Anti-infectives: Anti-infectives   Start      Dose/Rate Route Frequency Ordered Stop   08/22/12 0900  ceFAZolin (ANCEF) IVPB 1 g/50 mL premix  Status:  Discontinued     1 g 100 mL/hr over 30 Minutes Intravenous 4 times per day 08/21/12 2255 08/21/12 2259   08/21/12 2300  ceFAZolin (ANCEF) IVPB 1 g/50 mL premix     1 g 100 mL/hr over 30 Minutes Intravenous 4 times per day 08/21/12 2259 08/22/12 0709   08/21/12 2100  ceFAZolin (ANCEF) IVPB 1 g/50 mL premix  Status:  Discontinued     1 g 100 mL/hr over 30 Minutes Intravenous Every 6 hours 08/21/12 2047 08/21/12 2255   08/20/12 0830  Darunavir Ethanolate (PREZISTA) tablet 800 mg     800 mg Oral Daily with breakfast 08/19/12 1232     08/20/12 0830  ritonavir (NORVIR) tablet 100 mg     100 mg Oral Daily with breakfast 08/19/12 1232     08/19/12 1315  emtricitabine-tenofovir (TRUVADA) 200-300 MG per tablet 1 tablet     1 tablet Oral Daily 08/19/12 1232     08/19/12 1300  ceFAZolin (ANCEF) IVPB 2 g/50 mL premix     2 g 100 mL/hr over 30 Minutes Intravenous  Once 08/19/12 1151 08/19/12 1627      Assessment/Plan: s/p Procedure(s): OPEN REDUCTION INTERNAL FIXATION (ORIF) RIGHT LATERAL TIBIAL PLATEAU OPEN REDUCTION INTERNAL FIXATION (ORIF) CLAVICULAR FRACTURE d/c foley Advance diet Continue ABX therapy due to Post-op infection HIV positive Transfer to Orthopedic floor OT/PT as ordered Resume withdrawal protocol  LOS: 4 days   Marta Lamas. Gae Bon, MD, FACS [phone] Trauma Surgeon 08/22/2012

## 2012-08-23 ENCOUNTER — Encounter (HOSPITAL_COMMUNITY): Payer: Self-pay | Admitting: Orthopaedic Surgery

## 2012-08-23 LAB — CBC
HCT: 26.4 % — ABNORMAL LOW (ref 39.0–52.0)
MCH: 31.4 pg (ref 26.0–34.0)
MCHC: 34.1 g/dL (ref 30.0–36.0)
MCHC: 34.4 g/dL (ref 30.0–36.0)
Platelets: 226 10*3/uL (ref 150–400)
Platelets: 234 10*3/uL (ref 150–400)
Platelets: 246 10*3/uL (ref 150–400)
RBC: 2.99 MIL/uL — ABNORMAL LOW (ref 4.22–5.81)
RBC: 2.91 MIL/uL — ABNORMAL LOW (ref 4.22–5.81)
RDW: 11.2 % — ABNORMAL LOW (ref 11.5–15.5)
RDW: 11.3 % — ABNORMAL LOW (ref 11.5–15.5)
WBC: 8.5 10*3/uL (ref 4.0–10.5)
WBC: 8.9 10*3/uL (ref 4.0–10.5)

## 2012-08-23 LAB — HEPARIN LEVEL (UNFRACTIONATED): Heparin Unfractionated: 0.33 [IU]/mL (ref 0.30–0.70)

## 2012-08-23 LAB — BASIC METABOLIC PANEL
BUN: 4 mg/dL — ABNORMAL LOW (ref 6–23)
Calcium: 9 mg/dL (ref 8.4–10.5)
GFR calc Af Amer: >90 mL/min (ref >90)
GFR calc non Af Amer: >90 mL/min (ref >90)
Glucose, Bld: 131 mg/dL — ABNORMAL HIGH (ref 70–99)
Potassium: 3.3 mEq/L — ABNORMAL LOW (ref 3.5–5.1)
Sodium: 128 mEq/L — ABNORMAL LOW (ref 135–145)

## 2012-08-23 MED ORDER — WARFARIN VIDEO
1.0000 | Freq: Once | Status: AC
Start: 2012-08-24 — End: 2012-08-24
  Administered 2012-08-24: 1

## 2012-08-23 MED ORDER — COUMADIN BOOK
1.0000 | Freq: Once | Status: AC
  Administered 2012-08-23: 1
  Filled 2012-08-23: qty 1

## 2012-08-23 MED ORDER — WARFARIN - PHARMACIST DOSING INPATIENT
Freq: Every day | Status: DC
  Administered 2012-08-24: 19:00:00

## 2012-08-23 MED ORDER — WARFARIN SODIUM 10 MG PO TABS
10.0000 mg | ORAL_TABLET | Freq: Once | ORAL | Status: AC
  Administered 2012-08-23: 10 mg via ORAL
  Filled 2012-08-23: qty 1

## 2012-08-23 MED ORDER — SODIUM CHLORIDE 0.9 % IJ SOLN: 3.0000 mL | INTRAMUSCULAR | Status: DC | PRN
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
## 2012-08-23 NOTE — Progress Notes (Signed)
Rehab Admissions Coordinator Note:  Patient was screened by Clois Dupes for appropriateness for an Inpatient Acute Rehab Consult by SW.  At this time, we are recommending Skilled Nursing Facility. Patient lives in second floor hotel room and NWB to right side. He will need prolonged recovery time which is most appropriate at SNF.   Clois Dupes 08/23/2012, 2:36 PM  I can be reached at [phone].

## 2012-08-23 NOTE — Clinical Social Work Note (Signed)
Clinical Social Work Department CLINICAL SOCIAL WORK PLACEMENT NOTE 08/23/2012  Patient:  [name], [name]  Account Number:  [id number] Admit date:  08/18/2012  Clinical Social Worker:  Macario Golds, LCSW  Date/time:  08/23/2012 03:00 PM  Clinical Social Work is seeking post-discharge placement for this patient at the following level of care:   SKILLED NURSING   (*CSW will update this form in Epic as items are completed)   08/23/2012  Patient/family provided with [facility] Department of Clinical Social Work's list of facilities offering this level of care within the geographic area requested by the patient (or if unable, by the patient's family).  08/23/2012  Patient/family informed of their freedom to choose among providers that offer the needed level of care, that participate in Medicare, Medicaid or managed care program needed by the patient, have an available bed and are willing to accept the patient.  08/23/2012  Patient/family informed of [facility]' ownership interest in [facility], as well as of the fact that they are under no obligation to receive care at this facility.  PASARR submitted to EDS on 08/23/2012 PASARR number received from EDS on   FL2 transmitted to all facilities in geographic area requested by pt/family on  08/23/2012 FL2 transmitted to all facilities within larger geographic area on   Patient informed that his/her managed care company has contracts with or will negotiate with  certain facilities, including the following:     Patient/family informed of bed offers received:   Patient chooses bed at  Physician recommends and patient chooses bed at    Patient to be transferred to  on   Patient to be transferred to facility by   The following physician request were entered in Epic:   Additional Comments: 5/16 patient only agreeable to [facility] with the understanding that there may not be a facility available.

## 2012-08-23 NOTE — Progress Notes (Signed)
Physical Therapy Treatment Patient Details Name: [name] MRN: [id number] DOB: [birth year] Today's Date: 08/23/2012 Time: 4098-1191 PT Time Calculation (min): 27 min  PT Assessment / Plan / Recommendation Comments on Treatment Session       Follow Up Recommendations  SNF;Supervision/Assistance - 24 hour     Does the patient have the potential to tolerate intense rehabilitation     Barriers to Discharge        Equipment Recommendations       Recommendations for Other Services    Frequency Min 5X/week   Plan Discharge plan remains appropriate;Frequency remains appropriate    Precautions / Restrictions Precautions Precautions: Cervical Required Braces or Orthoses: Cervical Brace Cervical Brace: Hard collar Restrictions RUE Weight Bearing: Non weight bearing RLE Weight Bearing: Non weight bearing   Pertinent Vitals/Pain 10/10 neck pain    Mobility  Bed Mobility Left Sidelying to Sit: HOB elevated;With rails;1: +2 Total assist Left Sidelying to Sit: Patient Percentage: 50% Sitting - Scoot to Edge of Bed: 1: +2 Total assist Sitting - Scoot to Edge of Bed: Patient Percentage: 50% Details for Bed Mobility Assistance: Cues for sequencing and technique.  Needed assist for moving right LE off of bed as well as to elevate trunk.  Pt moves slowly.  Cues for deep breathing throughout Transfers Transfers: Squat Pivot Transfers Squat Pivot Transfers: 1: +2 Total assist Squat Pivot Transfers: Patient Percentage: 30% Details for Transfer Assistance: Patient squat pivot transfer from bed to recliner towards L side. Cueing throughout.  Ambulation/Gait Ambulation/Gait Assistance: Not tested (comment)    Exercises     PT Diagnosis:    PT Problem List:   PT Treatment Interventions:     PT Goals Acute Rehab PT Goals PT Goal: Supine/Side to Sit - Progress: Progressing toward goal PT Goal: Sit to Stand - Progress: Progressing toward goal PT Transfer Goal: Bed to Chair/Chair to  Bed - Progress: Progressing toward goal  Visit Information  Last PT Received On: 08/23/12 Assistance Needed: +2    Subjective Data      Cognition  Cognition Arousal/Alertness: Awake/alert Behavior During Therapy: Anxious Overall Cognitive Status: Difficult to assess    Balance  Static Sitting Balance Static Sitting - Balance Support: Left upper extremity supported;Feet supported Static Sitting - Level of Assistance: 5: Stand by assistance Static Sitting - Comment/# of Minutes: 6 mins  End of Session PT - End of Session Activity Tolerance: Patient limited by fatigue;Patient limited by pain Patient left: in bed;with call bell/phone within reach Nurse Communication: Mobility status;Need for lift equipment   GP     Nancye Grumbine, Adline Potter 08/23/2012, 1:44 PM 08/23/2012 Fredrich Birks PTA [phone] pager [phone] office
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
## 2012-08-24 LAB — CBC
HCT: 26.1 % — ABNORMAL LOW (ref 39.0–52.0)
Hemoglobin: 8.8 g/dL — ABNORMAL LOW (ref 13.0–17.0)
MCH: 30.7 pg (ref 26.0–34.0)
Platelets: 255 10*3/uL (ref 150–400)
Platelets: 269 10*3/uL (ref 150–400)
RBC: 2.82 MIL/uL — ABNORMAL LOW (ref 4.22–5.81)
RBC: 2.96 MIL/uL — ABNORMAL LOW (ref 4.22–5.81)
RBC: 2.87 MIL/uL — ABNORMAL LOW (ref 4.22–5.81)
RDW: 11.3 % — ABNORMAL LOW (ref 11.5–15.5)
WBC: 9.7 10*3/uL (ref 4.0–10.5)
WBC: 8.2 10*3/uL (ref 4.0–10.5)
WBC: 8.7 10*3/uL (ref 4.0–10.5)

## 2012-08-24 LAB — BASIC METABOLIC PANEL
Chloride: 92 mEq/L — ABNORMAL LOW (ref 96–112)
Creatinine, Ser: 0.59 mg/dL (ref 0.50–1.35)
GFR calc Af Amer: >90 mL/min (ref >90)
Potassium: 3.3 mEq/L — ABNORMAL LOW (ref 3.5–5.1)
Sodium: 130 mEq/L — ABNORMAL LOW (ref 135–145)

## 2012-08-24 LAB — PROTIME-INR
INR: 1.06 (ref 0.00–1.49)
Prothrombin Time: 13.7 s (ref 11.6–15.2)

## 2012-08-24 LAB — HEPARIN LEVEL (UNFRACTIONATED)
Heparin Unfractionated: 0.22 IU/mL — ABNORMAL LOW (ref 0.30–0.70)
Heparin Unfractionated: <0.1 [IU]/mL — ABNORMAL LOW (ref 0.30–0.70)

## 2012-08-24 MED ORDER — WHITE PETROLATUM GEL
Status: AC
  Administered 2012-08-24: 21:00:00
  Filled 2012-08-24: qty 5

## 2012-08-24 MED ORDER — HEPARIN BOLUS VIA INFUSION
2000.0000 [IU] | Freq: Once | INTRAVENOUS | Status: DC
  Filled 2012-08-24: qty 2000

## 2012-08-24 MED ORDER — WARFARIN SODIUM 10 MG PO TABS
10.0000 mg | ORAL_TABLET | Freq: Once | ORAL | Status: AC
  Administered 2012-08-24: 10 mg via ORAL
  Filled 2012-08-24: qty 1
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
## 2012-08-24 NOTE — Progress Notes (Signed)
Patient ID: [name], male   DOB: 10/27/1980, 32 y.o.   MRN: [id number] 3 Days Post-Op  Subjective: Feels very weak when trying to get up with therapies, lives alone, friends in town cannot provide 24h support at D/C, BM x3 yesterday  Objective: Vital signs in last 24 hours: Temp:  [98.2 F (36.8 C)-99.5 F (37.5 C)] 98.2 F (36.8 C) (05/17 0704) Pulse Rate:  [100-106] 106 (05/17 0704) Resp:  [18-20] 18 (05/17 0704) BP: (152-155)/(78-89) 155/78 mmHg (05/17 0704) SpO2:  [93 %-96 %] 96 % (05/17 0704) Last BM Date: 08/23/12  Intake/Output from previous day: 05/16 0701 - 05/17 0700 In: 720 [P.O.:480; I.V.:240] Out: 1702 [Urine:1700; Stool:2] Intake/Output this shift:    General appearance: alert and cooperative Neck: collar Resp: clear to auscultation bilaterally Cardio: regular rate and rhythm GI: soft, NT, ND Extremities: splint RLE  Lab Results: CBC   Recent Labs  08/23/12 2049 08/24/12 0400  WBC 8.9 9.7  HGB 9.1* 8.8*  HCT 26.5* 25.6*  PLT 246 255   BMET  Recent Labs  08/23/12 0350 08/24/12 0400  NA 128* 130*  K 3.3* 3.3*  CL 92* 92*  CO2 27 26  GLUCOSE 131* 132*  BUN 4* 4*  CREATININE 0.59 0.59  CALCIUM 9.0 9.0   PT/INR  Recent Labs  08/24/12 0400  LABPROT 13.7  INR 1.06   ABG No results found for this basename: PHART, PCO2, PO2, HCO3,  in the last 72 hours  Studies/Results: No results found.  Anti-infectives: Anti-infectives   Start     Dose/Rate Route Frequency Ordered Stop   08/22/12 0900  ceFAZolin (ANCEF) IVPB 1 g/50 mL premix  Status:  Discontinued     1 g 100 mL/hr over 30 Minutes Intravenous 4 times per day 08/21/12 2255 08/21/12 2259   08/21/12 2300  ceFAZolin (ANCEF) IVPB 1 g/50 mL premix     1 g 100 mL/hr over 30 Minutes Intravenous 4 times per day 08/21/12 2259 08/22/12 0709   08/21/12 2100  ceFAZolin (ANCEF) IVPB 1 g/50 mL premix  Status:  Discontinued     1 g 100 mL/hr over 30 Minutes Intravenous Every 6 hours  08/21/12 2047 08/21/12 2255   08/20/12 0830  Darunavir Ethanolate (PREZISTA) tablet 800 mg     800 mg Oral Daily with breakfast 08/19/12 1232     08/20/12 0830  ritonavir (NORVIR) tablet 100 mg     100 mg Oral Daily with breakfast 08/19/12 1232     08/19/12 1315  emtricitabine-tenofovir (TRUVADA) 200-300 MG per tablet 1 tablet     1 tablet Oral Daily 08/19/12 1232     08/19/12 1300  ceFAZolin (ANCEF) IVPB 2 g/50 mL premix     2 g 100 mL/hr over 30 Minutes Intravenous  Once 08/19/12 1151 08/19/12 1627      Assessment/Plan: s/p Procedure(s): OPEN REDUCTION INTERNAL FIXATION (ORIF) RIGHT LATERAL TIBIAL PLATEAU OPEN REDUCTION INTERNAL FIXATION (ORIF) CLAVICULAR FRACTURE PHBC C2 Fx - collar R clavicle Fx - S/P ORIF R tibial plateau Fx - S/P ORIF HIV L carotid dissection - heparin/coumadin Dispo - therapies and work toward SNF this week  LOS: 6 days    Violeta Gelinas, MD, MPH, FACS Pager: [phone]  08/24/2012

## 2012-08-24 NOTE — Progress Notes (Signed)
Pt has continually taken his aspen collar off on several occassions during the night. Instructed pt on the importance of keeping this on. Stated he understood and he "was sorry for taking it off". Thought he would still continue to remove it or loosen it so his chin would not be sitting in the correct position. Repositioned on many occassions and pt continues to be noncompliant. Will continue to monitor.

## 2012-08-25 LAB — HEPARIN LEVEL (UNFRACTIONATED)
Heparin Unfractionated: <0.1 [IU]/mL — ABNORMAL LOW (ref 0.30–0.70)
Heparin Unfractionated: <0.1 [IU]/mL — ABNORMAL LOW (ref 0.30–0.70)

## 2012-08-25 LAB — CBC
Hemoglobin: 9.3 g/dL — ABNORMAL LOW (ref 13.0–17.0)
MCHC: 34.1 g/dL (ref 30.0–36.0)
MCV: 90.4 fL (ref 78.0–100.0)
Platelets: 322 10*3/uL (ref 150–400)
RBC: 2.92 MIL/uL — ABNORMAL LOW (ref 4.22–5.81)
RDW: 11.3 % — ABNORMAL LOW (ref 11.5–15.5)
WBC: 8.2 10*3/uL (ref 4.0–10.5)

## 2012-08-25 MED ORDER — HEPARIN BOLUS VIA INFUSION
3000.0000 [IU] | Freq: Once | INTRAVENOUS | Status: AC
  Administered 2012-08-25: 3000 [IU] via INTRAVENOUS
  Filled 2012-08-25: qty 3000

## 2012-08-25 MED ORDER — HEPARIN BOLUS VIA INFUSION
3300.0000 [IU] | Freq: Once | INTRAVENOUS | Status: DC
  Filled 2012-08-25: qty 3300

## 2012-08-25 MED ORDER — WARFARIN SODIUM 7.5 MG PO TABS
7.5000 mg | ORAL_TABLET | Freq: Once | ORAL | Status: AC
  Administered 2012-08-25: 7.5 mg via ORAL
  Filled 2012-08-25: qty 1
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
## 2012-08-25 NOTE — Progress Notes (Signed)
Subjective: 4 Days Post-Op Procedure(s) (LRB): OPEN REDUCTION INTERNAL FIXATION (ORIF) RIGHT LATERAL TIBIAL PLATEAU (Right) OPEN REDUCTION INTERNAL FIXATION (ORIF) CLAVICULAR FRACTURE (Right) Patient reports pain as moderate.    Objective: Vital signs in last 24 hours: Temp:  [98 F (36.7 C)-99.1 F (37.3 C)] 98.5 F (36.9 C) (05/18 0607) Pulse Rate:  [94-109] 94 (05/18 0607) Resp:  [18-20] 20 (05/18 0607) BP: (136-143)/(72-82) 136/74 mmHg (05/18 0607) SpO2:  [95 %-100 %] 95 % (05/18 0607)  Intake/Output from previous day: 05/17 0701 - 05/18 0700 In: 788 [P.O.:680; I.V.:108] Out: 2150 [Urine:2150] Intake/Output this shift:     Recent Labs  08/23/12 1128 08/23/12 2049 08/24/12 0400 08/24/12 1120 08/24/12 2000  HGB 9.4* 9.1* 8.8* 9.1* 9.2*    Recent Labs  08/24/12 1120 08/24/12 2000  WBC 8.2 8.7  RBC 2.96* 2.87*  HCT 27.2* 26.1*  PLT 269 316    Recent Labs  08/23/12 0350 08/24/12 0400  NA 128* 130*  K 3.3* 3.3*  CL 92* 92*  CO2 27 26  BUN 4* 4*  CREATININE 0.59 0.59  GLUCOSE 131* 132*  CALCIUM 9.0 9.0    Recent Labs  08/24/12 0400  INR 1.06    Sensation intact distally Intact pulses distally Dorsiflexion/Plantar flexion intact Incision: scant drainage No cellulitis present Compartment soft Dressings changed right clavicle and right leg.  Assessment/Plan: 4 Days Post-Op Procedure(s) (LRB): OPEN REDUCTION INTERNAL FIXATION (ORIF) RIGHT LATERAL TIBIAL PLATEAU (Right) OPEN REDUCTION INTERNAL FIXATION (ORIF) CLAVICULAR FRACTURE (Right) No new recommendations.  Continue NWB right leg.  Opel Lejeune Y 08/25/2012, 8:27 AM

## 2012-08-26 DIAGNOSIS — D62 Acute posthemorrhagic anemia: Secondary | ICD-10-CM | POA: Diagnosis not present

## 2012-08-26 LAB — HEPARIN LEVEL (UNFRACTIONATED)
Heparin Unfractionated: 0.12 [IU]/mL — ABNORMAL LOW (ref 0.30–0.70)
Heparin Unfractionated: 0.21 [IU]/mL — ABNORMAL LOW (ref 0.30–0.70)
Heparin Unfractionated: 0.65 [IU]/mL (ref 0.30–0.70)
Heparin Unfractionated: 0.78 [IU]/mL — ABNORMAL HIGH (ref 0.30–0.70)

## 2012-08-26 LAB — CBC
HCT: 27.1 % — ABNORMAL LOW (ref 39.0–52.0)
Hemoglobin: 9.5 g/dL — ABNORMAL LOW (ref 13.0–17.0)
MCH: 30.7 pg (ref 26.0–34.0)
MCH: 31.6 pg (ref 26.0–34.0)
MCHC: 34 g/dL (ref 30.0–36.0)
MCHC: 35.1 g/dL (ref 30.0–36.0)
MCV: 90 fL (ref 78.0–100.0)
Platelets: 299 10*3/uL (ref 150–400)
RDW: 11.6 % (ref 11.5–15.5)

## 2012-08-26 LAB — BASIC METABOLIC PANEL
CO2: 25 mEq/L (ref 19–32)
Calcium: 9.5 mg/dL (ref 8.4–10.5)
GFR calc non Af Amer: >90 mL/min (ref >90)
Potassium: 4.1 mEq/L (ref 3.5–5.1)
Sodium: 129 mEq/L — ABNORMAL LOW (ref 135–145)

## 2012-08-26 MED ORDER — HEPARIN BOLUS VIA INFUSION
3000.0000 [IU] | Freq: Once | INTRAVENOUS | Status: AC
  Administered 2012-08-26: 3000 [IU] via INTRAVENOUS
  Filled 2012-08-26: qty 3000

## 2012-08-26 MED ORDER — ALPRAZOLAM 0.5 MG PO TABS
0.5000 mg | ORAL_TABLET | Freq: Three times a day (TID) | ORAL | Status: DC | PRN
  Administered 2012-08-26 – 2012-09-06 (×9): 0.5 mg via ORAL
  Filled 2012-08-26 (×9): qty 1

## 2012-08-26 MED ORDER — SPIRITUS FRUMENTI
1.0000 | Freq: Two times a day (BID) | ORAL | Status: DC
  Administered 2012-08-26 – 2012-08-27 (×2): 1 via ORAL
  Filled 2012-08-26 (×4): qty 1

## 2012-08-26 MED ORDER — SODIUM CHLORIDE 1 G PO TABS
1.0000 g | ORAL_TABLET | Freq: Three times a day (TID) | ORAL | Status: DC
  Administered 2012-08-26 – 2012-08-28 (×8): 1 g via ORAL
  Filled 2012-08-26 (×12): qty 1

## 2012-08-26 MED ORDER — WARFARIN SODIUM 7.5 MG PO TABS
7.5000 mg | ORAL_TABLET | Freq: Once | ORAL | Status: AC
  Administered 2012-08-26: 7.5 mg via ORAL
  Filled 2012-08-26: qty 1

## 2012-08-26 MED ORDER — HYDROMORPHONE HCL PF 1 MG/ML IJ SOLN
0.5000 mg | INTRAMUSCULAR | Status: DC | PRN
  Administered 2012-08-27 – 2012-09-01 (×5): 0.5 mg via INTRAVENOUS
  Filled 2012-08-26 (×5): qty 1
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
## 2012-08-26 NOTE — Progress Notes (Signed)
UR completed 

## 2012-08-26 NOTE — Progress Notes (Signed)
Orthopedic Tech Progress Note Patient Details:  [name] [birth year] [id number] [employer] called for brace order Patient ID: [name], male   DOB: 24-Dec-1980, 32 y.o.   MRN: [id number]   [name] 08/26/2012, 10:19 AM

## 2012-08-26 NOTE — Clinical Social Work Note (Signed)
Clinical Social Worker continuing to follow patient for support and discharge planning needs.  Patient will need SNF at discharge - CSW actively pursuing Medicaid only SNF placement.  CSW has obtained patient SSN and therefore will re submit [facility].  CSW remains available for support and to facilitate patient discharge needs once medically stable.  Macario Golds, [place] [phone]

## 2012-08-26 NOTE — Progress Notes (Signed)
Patient examined and I agree with the assessment and plan Working on a better fitting collar. Difficult to anticoagulate. Anticipate SNF late this week - best case. Violeta Gelinas, MD, MPH, FACS Pager: [phone]  08/26/2012 1:42 PM

## 2012-08-26 NOTE — Progress Notes (Signed)
Working toward SNF Gary Gelinas, MD, MPH, FACS Pager: [phone]

## 2012-08-26 NOTE — Progress Notes (Signed)
Patient ID: [name], male   DOB: [birth year], 32 y.o.   MRN: [id number]   LOS: 8 days   Subjective: No c/o. Collar is fitting poorly, pt with chin tucked and head rotated rightward. Says it hurts.   Objective: Vital signs in last 24 hours: Temp:  [98 F (36.7 C)-98.9 F (37.2 C)] 98 F (36.7 C) (05/19 0657) Pulse Rate:  [86-98] 86 (05/19 0657) Resp:  [16-18] 16 (05/19 0657) BP: (126-152)/(69-82) 126/69 mmHg (05/19 0657) SpO2:  [93 %] 93 % (05/19 0657) Last BM Date: 08/24/12   Laboratory  CBC  Recent Labs  08/25/12 1519 08/26/12 0017  WBC 8.2 7.2  HGB 9.0* 9.0*  HCT 26.4* 26.5*  PLT 322 299   Lab Results  Component Value Date   INR 1.63* 08/26/2012   INR 1.49 08/25/2012   INR 1.06 08/24/2012     Physical Exam General appearance: alert and no distress Resp: clear to auscultation bilaterally Cardio: regular rate and rhythm GI: normal findings: bowel sounds normal and soft, non-tender   Assessment/Plan: PHBC  C2 Fx - Will see if we can get a better fitting collar R clavicle Fx - S/P ORIF  R tibial plateau Fx - S/P ORIF  L carotid dissection - heparin/coumadin  ABL anemia -- Stable EtOH -- Will have to d/c beer for SNF placement, will start to wean Multiple medical problems -- Home meds FEN -- Recheck BMET with hyponatremia and hypokalemia seen on Friday VTE -- Heparin gtts, coumadin Dispo - SNF once coumadin therapeutic x3d    Freeman Caldron, PA-C Pager: [phone] General Trauma PA Pager: [phone]   08/26/2012

## 2012-08-26 NOTE — Progress Notes (Signed)
Physical Therapy Treatment Patient Details Name: [name] MRN: [id number] DOB: May 12, 1980 Today's Date: 08/26/2012 Time: 5784-6962 PT Time Calculation (min): 27 min  PT Assessment / Plan / Recommendation Comments on Treatment Session  Pt cont to be limited in therapy due to increased pain and anxiety with activity. Pt requires 2+ assitance for bed mobility. Will require SNF upon acute D/C to maximize functional mobility and due to amount of assistance needed.    Follow Up Recommendations  SNF;Supervision/Assistance - 24 hour     Does the patient have the potential to tolerate intense rehabilitation     Barriers to Discharge        Equipment Recommendations  Wheelchair (measurements PT);Wheelchair cushion (measurements PT);Rolling walker with 5" wheels    Recommendations for Other Services    Frequency Min 5X/week   Plan Discharge plan remains appropriate;Frequency remains appropriate    Precautions / Restrictions Precautions Precautions: Cervical;Fall Required Braces or Orthoses: Cervical Brace Cervical Brace: Hard collar Restrictions Weight Bearing Restrictions: Yes RUE Weight Bearing: Non weight bearing RLE Weight Bearing: Non weight bearing   Pertinent Vitals/Pain Did not rate pain; pt very tearful and cries out in pain constantly and consistently with movement.     Mobility  Bed Mobility Bed Mobility: Right Sidelying to Sit;Sit to Supine Right Sidelying to Sit: 1: +2 Total assist;HOB elevated;With rails Right Sidelying to Sit: Patient Percentage: 10% Sitting - Scoot to Edge of Bed: 1: +2 Total assist Sitting - Scoot to Edge of Bed: Patient Percentage: 60% Sit to Supine: 1: +2 Total assist Sit to Supine: Patient Percentage: 20% Details for Bed Mobility Assistance: pt lying on R side; unable to recognize that R UE should be NWB; required max cues for sequencing and to maintain NWB status on R UE and LE; pt cries out in pain with any movement; requires increased  time Transfers Transfers: Not assessed (pt refused) Ambulation/Gait Ambulation/Gait Assistance: Not tested (comment) Stairs: No Wheelchair Mobility Wheelchair Mobility: No    Exercises General Exercises - Lower Extremity Ankle Circles/Pumps: AROM;AAROM;Both;10 reps;Seated (ARROM for R LE )   PT Diagnosis:    PT Problem List:   PT Treatment Interventions:     PT Goals Acute Rehab PT Goals PT Goal Formulation: With patient Time For Goal Achievement: 09/05/12 Potential to Achieve Goals: Good PT Goal: Supine/Side to Sit - Progress: Progressing toward goal  Visit Information  Last PT Received On: 08/26/12 Assistance Needed: +2    Subjective Data  Subjective: pt lying on R side; requires max encouragement to participate in therapy; [employer] rep present to change cervical neck collar   Cognition  Cognition Arousal/Alertness: Awake/alert Behavior During Therapy: Anxious Overall Cognitive Status: No family/caregiver present to determine baseline cognitive functioning    Balance  Balance Balance Assessed: Yes Static Sitting Balance Static Sitting - Balance Support: Left upper extremity supported;Feet unsupported Static Sitting - Level of Assistance: 4: Min assist;5: Stand by assistance Static Sitting - Comment/# of Minutes: progressed to stand by assist; pt tearful and cried out in pain; tolerated sitting EOB ~10 min; had intial posterior lean correctable with multimodal cues Dynamic Sitting Balance Dynamic Sitting - Balance Support: Left upper extremity supported;During functional activity (L foot supported on ground; R unsupported) Dynamic Sitting - Level of Assistance: 5: Stand by assistance Dynamic Sitting Balance - Compensations: pt able to reach out of BOS with LUE and maintain balance   End of Session PT - End of Session Equipment Utilized During Treatment: Cervical collar Activity Tolerance: Patient limited by  fatigue;Patient limited by pain Patient left: in bed;with  call bell/phone within reach;with family/visitor present Nurse Communication: Mobility status;Patient requests pain meds   GP     Donell Sievert, [place] [phone] 08/26/2012, 1:22 PM

## 2012-08-26 NOTE — Progress Notes (Signed)
ANTICOAGULATION CONSULT NOTE - Pharmacy Consult for Heparin Indication: vertebral artery dissection/thrombus  No Known Allergies  Patient Measurements: Height: 5\' 8"  (172.7 cm) Weight: 150 lb (68.04 kg) IBW/kg (Calculated) : 68.4 Heparin Dosing Weight: 68 kg  Vital Signs: Temp: 98.9 F (37.2 C) (05/18 1437) BP: 152/82 mmHg (05/18 1437) Pulse Rate: 98 (05/18 1437)  Labs:  Recent Labs  08/23/12 0350  08/24/12 0400  08/25/12 0826 08/25/12 1519 08/26/12 0017  HGB 9.0*  < > 8.8*  < > 9.3* 9.0* 9.0*  HCT 26.4*  < > 25.6*  < > 27.3* 26.4* 26.5*  PLT 226  < > 255  < > 355 322 299  LABPROT  --   --  13.7  --  17.6*  --   --   INR  --   --  1.06  --  1.49  --   --   HEPARINUNFRC 0.14*  < > 0.22*  < > <0.10* 0.12* 0.12*  CREATININE 0.59  --  0.59  --   --   --   --   < > = values in this interval not displayed.  Estimated Creatinine Clearance: 126.3 ml/min (by C-G formula based on Cr of 0.59).  Assessment: 32 YO male with vertebral artery dissection/thrombus for heparin. Heparin level (0.12) is below-goal on 3200 units/hr. No problem with line / infusion and no bleeding per RN.   Goal of Therapy:  Heparin level 0.3-0.7 units/ml Monitor platelets by anticoagulation protocol: Yes   Plan:  1. Heparin 3000 unit IV bolus x 1, then increase IV infusion to 3500 units/hr.  2. Heparin level in 6 hours.   Lorre Munroe, PharmD  08/26/2012,12:56 AM

## 2012-08-27 DIAGNOSIS — S060X9A Concussion with loss of consciousness of unspecified duration, initial encounter: Secondary | ICD-10-CM

## 2012-08-27 DIAGNOSIS — D75829 Heparin-induced thrombocytopenia, unspecified: Secondary | ICD-10-CM | POA: Diagnosis not present

## 2012-08-27 DIAGNOSIS — S0101XA Laceration without foreign body of scalp, initial encounter: Secondary | ICD-10-CM

## 2012-08-27 DIAGNOSIS — D7582 Heparin induced thrombocytopenia (HIT): Secondary | ICD-10-CM | POA: Diagnosis not present

## 2012-08-27 HISTORY — DX: Heparin-induced thrombocytopenia, unspecified: D75.829

## 2012-08-27 LAB — COMPREHENSIVE METABOLIC PANEL
BUN: 6 mg/dL (ref 6–23)
Calcium: 9.8 mg/dL (ref 8.4–10.5)
GFR calc Af Amer: >90 mL/min (ref >90)
Glucose, Bld: 134 mg/dL — ABNORMAL HIGH (ref 70–99)
Sodium: 132 mEq/L — ABNORMAL LOW (ref 135–145)
Total Protein: 7.7 g/dL (ref 6.0–8.3)

## 2012-08-27 LAB — CBC
HCT: 27.6 % — ABNORMAL LOW (ref 39.0–52.0)
Hemoglobin: 9.7 g/dL — ABNORMAL LOW (ref 13.0–17.0)
WBC: 8.2 10*3/uL (ref 4.0–10.5)

## 2012-08-27 LAB — PROTIME-INR: Prothrombin Time: 21.6 s — ABNORMAL HIGH (ref 11.6–15.2)

## 2012-08-27 MED ORDER — TRAMADOL HCL 50 MG PO TABS
100.0000 mg | ORAL_TABLET | Freq: Four times a day (QID) | ORAL | Status: DC
  Administered 2012-08-27 – 2012-09-06 (×39): 100 mg via ORAL
  Filled 2012-08-27 (×39): qty 2

## 2012-08-27 MED ORDER — ARGATROBAN 50 MG/50ML IV SOLN
2.0000 ug/kg/min | INTRAVENOUS | Status: DC
  Filled 2012-08-27: qty 50

## 2012-08-27 MED ORDER — VITAMIN K1 10 MG/ML IJ SOLN
5.0000 mg | Freq: Once | INTRAVENOUS | Status: AC
  Administered 2012-08-27: 5 mg via INTRAVENOUS
  Filled 2012-08-27: qty 0.5

## 2012-08-27 MED ORDER — DOCUSATE SODIUM 100 MG PO CAPS
200.0000 mg | ORAL_CAPSULE | Freq: Two times a day (BID) | ORAL | Status: DC
  Administered 2012-08-27 – 2012-09-05 (×15): 200 mg via ORAL
  Filled 2012-08-27 (×8): qty 2
  Filled 2012-08-27: qty 1
  Filled 2012-08-27 (×12): qty 2

## 2012-08-27 MED ORDER — BISACODYL 5 MG PO TBEC
10.0000 mg | DELAYED_RELEASE_TABLET | Freq: Every day | ORAL | Status: DC
  Administered 2012-08-27 – 2012-09-05 (×9): 10 mg via ORAL
  Filled 2012-08-27 (×4): qty 2
  Filled 2012-08-27: qty 1
  Filled 2012-08-27 (×4): qty 2

## 2012-08-27 MED ORDER — ARGATROBAN 50 MG/50ML IV SOLN
0.2250 ug/kg/min | INTRAVENOUS | Status: DC
  Administered 2012-08-27: 0.35 ug/kg/min via INTRAVENOUS
  Administered 2012-08-27: 0.5 ug/kg/min via INTRAVENOUS
  Administered 2012-08-28: 0.25 ug/kg/min via INTRAVENOUS
  Administered 2012-08-30 – 2012-09-06 (×4): 0.225 ug/kg/min via INTRAVENOUS
  Filled 2012-08-27 (×6): qty 50
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
## 2012-08-27 NOTE — Progress Notes (Signed)
PT Cancellation Note  Patient Details Name: [name] MRN: [id number] DOB: 1980/07/14   Cancelled Treatment:    Reason Eval/Treat Not Completed: Medical issues which prohibited therapy. Per RN will hold today as patient high risk for blood clots and they are working on new medicine for patient. Will see patient tomorrow 5/21   Fredrich Birks 08/27/2012, 11:54 AM

## 2012-08-27 NOTE — Clinical Social Work Note (Signed)
Clinical Social Worker continuing to follow patient for support and discharge planning needs.  CSW spoke with patient at bedside who states that he understands that there is not a bed available in [place] at this time and is willing to expand search to [place] and [place].  Patient expressed concerns regarding follow up MD appointments in which CSW assured patient would not be an issue.  CSW to initiate search to expanded counties in hopes of finding suitable placement for patient at discharge.  CSW has also submitted for [facility] and awaiting visit from reviewer for authorization number - patient aware.  Patient remains medically unstable due to Heparin induced Thrombocytopenia and will require further hospitalization prior to placement at this time.  CSW will remain available for patient support and to facilitate patient discharge needs once bed available and patient medically stable.  [name], [place] [phone]

## 2012-08-27 NOTE — Progress Notes (Signed)
Patient ID: [name], male   DOB: 12-27-1980, 32 y.o.   MRN: [id number]   LOS: 9 days   Subjective: No new c/o but having some increased pain.   Objective: Vital signs in last 24 hours: Temp:  [97.6 F (36.4 C)-98 F (36.7 C)] 97.6 F (36.4 C) (05/20 0530) Pulse Rate:  [83-97] 97 (05/20 0530) Resp:  [18] 18 (05/20 0530) BP: (143-152)/(70-81) 152/81 mmHg (05/20 0530) SpO2:  [93 %-98 %] 98 % (05/20 0530) Last BM Date: 08/24/12   Laboratory  CBC  Recent Labs  08/26/12 0820 08/27/12 0355  WBC 7.3 8.2  HGB 9.5* 9.7*  HCT 27.1* 27.6*  PLT 202 91*   BMET  Recent Labs  08/26/12 1015 08/27/12 0355  NA 129* 132*  K 4.1 4.2  CL 91* 93*  CO2 25 28  GLUCOSE 107* 134*  BUN 7 6  CREATININE 0.61 0.71  CALCIUM 9.5 9.8   Lab Results  Component Value Date   INR 1.96* 08/27/2012   INR 1.63* 08/26/2012   INR 1.49 08/25/2012      Physical Exam General appearance: alert and no distress Resp: clear to auscultation bilaterally Cardio: regular rate and rhythm GI: normal findings: bowel sounds normal and soft, non-tender Extremities: NVI Incision/Wound:Scalp lac C/D/I   Assessment/Plan: PHBC  Concussion Scalp lac -- D/C sutures C2 Fx - C-collar R clavicle Fx - S/P ORIF  R tibial plateau Fx - S/P ORIF  L carotid dissection - heparin/coumadin, 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

## 2012-08-27 NOTE — Progress Notes (Signed)
Subjective: 6 Days Post-Op Procedure(s) (LRB): OPEN REDUCTION INTERNAL FIXATION (ORIF) RIGHT LATERAL TIBIAL PLATEAU (Right) OPEN REDUCTION INTERNAL FIXATION (ORIF) CLAVICULAR FRACTURE (Right) Patient reports pain as mild.  States he is feeling better.  "been doing my exercises"  Objective: Vital signs in last 24 hours: Temp:  [97.6 F (36.4 C)-98.1 F (36.7 C)] 98.1 F (36.7 C) (05/20 1446) Pulse Rate:  [90-99] 99 (05/20 1446) Resp:  [18] 18 (05/20 1446) BP: (129-152)/(70-81) 129/76 mmHg (05/20 1446) SpO2:  [93 %-98 %] 95 % (05/20 1446)  Intake/Output from previous day: 05/19 0701 - 05/20 0700 In: 480 [P.O.:480] Out: 600 [Urine:600] Intake/Output this shift: Total I/O In: 480 [P.O.:480] Out: 700 [Urine:700]   Recent Labs  08/25/12 0826 08/25/12 1519 08/26/12 0017 08/26/12 0820 08/27/12 0355  HGB 9.3* 9.0* 9.0* 9.5* 9.7*    Recent Labs  08/26/12 0820 08/27/12 0355  WBC 7.3 8.2  RBC 3.01* 3.08*  HCT 27.1* 27.6*  PLT 202 91*    Recent Labs  08/26/12 1015 08/27/12 0355  NA 129* 132*  K 4.1 4.2  CL 91* 93*  CO2 25 28  BUN 7 6  CREATININE 0.61 0.71  GLUCOSE 107* 134*  CALCIUM 9.5 9.8    Recent Labs  08/26/12 0645 08/27/12 0355  INR 1.63* 1.96*   Right upper and lower extremity:  Neurovascular intact Sensation intact distally Dorsiflexion/Plantar flexion intact Incision: no drainage skin abrasions over the anterior right calf healing well without purulence or exudate.  Continue with xeroform drsg. Compartments soft of right calf Mild edema of right knee as expected.  Assessment/Plan: 6 Days Post-Op Procedure(s) (LRB): OPEN REDUCTION INTERNAL FIXATION (ORIF) RIGHT LATERAL TIBIAL PLATEAU (Right) OPEN REDUCTION INTERNAL FIXATION (ORIF) CLAVICULAR FRACTURE (Right) Up with therapy Discharge to SNF Continue with increasing activity as medical condition allows.  NWB right upper and lower extremities.  ROM of right knee gently.  Continue ice and  elevation when at rest.  No pushing or pulling with the right arm.  Sling should be used when out of bed and may be off at rest. Dressing changes as needed.  Mechel Haggard M 08/27/2012, 4:35 PM

## 2012-08-27 NOTE — Progress Notes (Signed)
ANTICOAGULATION CONSULT NOTE - Pharmacy Consult for Heparin Indication: vertebral artery dissection/thrombus  No Known Allergies  Patient Measurements: Height: 5\' 8"  (172.7 cm) Weight: 150 lb (68.04 kg) IBW/kg (Calculated) : 68.4 Heparin Dosing Weight: 68 kg  Vital Signs: Temp: 98 F (36.7 C) (05/19 2118) BP: 152/70 mmHg (05/19 2118) Pulse Rate: 90 (05/19 2118)  Labs:  Recent Labs  08/24/12 0400  08/25/12 0826 08/25/12 1519 08/26/12 0017 08/26/12 0645 08/26/12 0820 08/26/12 1015 08/26/12 1437 08/26/12 2234  HGB 8.8*  < > 9.3* 9.0* 9.0*  --  9.5*  --   --   --   HCT 25.6*  < > 27.3* 26.4* 26.5*  --  27.1*  --   --   --   PLT 255  < > 355 322 299  --  202  --   --   --   LABPROT 13.7  --  17.6*  --   --  18.8*  --   --   --   --   INR 1.06  --  1.49  --   --  1.63*  --   --   --   --   HEPARINUNFRC 0.22*  < > <0.10* 0.12* 0.12* 0.21*  --   --  0.78* 0.65  CREATININE 0.59  --   --   --   --   --   --  0.61  --   --   < > = values in this interval not displayed.  Estimated Creatinine Clearance: 126.3 ml/min (by C-G formula based on Cr of 0.61).  Assessment: 32 YO male with vertebral artery dissection/thrombus for heparin.  Goal of Therapy:  Heparin level 0.3-0.7 units/ml Monitor platelets by anticoagulation protocol: Yes   Plan:  Continue Heparin at current rate   Geannie Risen, PharmD, BCPS   08/27/2012,12:24 AM

## 2012-08-27 NOTE — Progress Notes (Signed)
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

## 2012-08-28 LAB — APTT
aPTT: 78 s — ABNORMAL HIGH (ref 24–37)
aPTT: 70 s — ABNORMAL HIGH (ref 24–37)

## 2012-08-28 LAB — PROTIME-INR
INR: 1.32 (ref 0.00–1.49)
Prothrombin Time: 16.1 s — ABNORMAL HIGH (ref 11.6–15.2)

## 2012-08-28 LAB — CBC
HCT: 27.4 % — ABNORMAL LOW (ref 39.0–52.0)
Hemoglobin: 9.4 g/dL — ABNORMAL LOW (ref 13.0–17.0)
MCHC: 34.3 g/dL (ref 30.0–36.0)
RBC: 3.07 MIL/uL — ABNORMAL LOW (ref 4.22–5.81)

## 2012-08-28 MED ORDER — MAGNESIUM CITRATE PO SOLN
1.0000 | Freq: Once | ORAL | Status: AC
  Administered 2012-08-28: 1 via ORAL
  Filled 2012-08-28: qty 296

## 2012-08-28 NOTE — Progress Notes (Signed)
ANTICOAGULATION CONSULT NOTE  Pharmacy Consult for Argatroban  Indication: vertebral artery dissection with intraluminal thrombus  No Known Allergies  Patient Measurements: Height: 5\' 8"  (172.7 cm) Weight: 150 lb (68.04 kg) IBW/kg (Calculated) : 68.4 Heparin Dosing Weight: 68 kg  Vital Signs: Temp: 98.6 F (37 C) (05/20 2154) BP: 151/79 mmHg (05/20 2154) Pulse Rate: 89 (05/20 2154)  Labs:  Recent Labs  08/25/12 0826  08/26/12 0017 08/26/12 0645 08/26/12 0820 08/26/12 1015 08/26/12 1437 08/26/12 2234 08/27/12 0355  08/27/12 1413 08/27/12 1802 08/27/12 2302  HGB 9.3*  < > 9.0*  --  9.5*  --   --   --  9.7*  --   --   --   --   HCT 27.3*  < > 26.5*  --  27.1*  --   --   --  27.6*  --   --   --   --   PLT 355  < > 299  --  202  --   --   --  91*  --   --   --   --   APTT  --   --   --   --   --   --   --   --   --   < > 114* 107* 78*  LABPROT 17.6*  --   --  18.8*  --   --   --   --  21.6*  --   --   --   --   INR 1.49  --   --  1.63*  --   --   --   --  1.96*  --   --   --   --   HEPARINUNFRC <0.10*  < > 0.12* 0.21*  --   --  0.78* 0.65 0.44  --   --   --   --   CREATININE  --   --   --   --   --  0.61  --   --  0.71  --   --   --   --   < > = values in this interval not displayed.  Estimated Creatinine Clearance: 126.3 ml/min (by C-G formula based on Cr of 0.71).  Assessment: 32 yo male with vertebral artery thrombus, now with possible HIT, for argatroban  Goal of Therapy:  INR 2-3 aPTT 50-90 seconds Monitor platelets by anticoagulation protocol: Yes   Plan:  Continue argatroban at current rate Follow-up am labs.  Geannie Risen, PharmD, BCPS

## 2012-08-28 NOTE — Progress Notes (Signed)
Argatroban protocol. PLTs going up.  Moving toward SNF possibly early next week once coumadin therapeutic - we cannot start it again until PLTs 150k.  I discussed the plan with him. Patient examined and I agree with the assessment and plan  Violeta Gelinas, MD, MPH, FACS Pager: [phone]  08/28/2012 9:39 AM

## 2012-08-28 NOTE — Progress Notes (Signed)
Physical Therapy Treatment Patient Details Name: [name] MRN: [id number] DOB: [birth year] Today's Date: 08/28/2012 Time: 1324-4010 PT Time Calculation (min): 23 min  PT Assessment / Plan / Recommendation Comments on Treatment Session  Pt cont to be limited in therapy due to increased pain and anxiety with activity. Pt requires 2+ assitance for bed mobility. Will require SNF upon acute D/C to maximize functional mobility and due to amount of assistance needed.    Follow Up Recommendations  SNF;Supervision/Assistance - 24 hour     Does the patient have the potential to tolerate intense rehabilitation     Barriers to Discharge        Equipment Recommendations  Wheelchair (measurements PT);Wheelchair cushion (measurements PT);Rolling walker with 5" wheels    Recommendations for Other Services    Frequency Min 5X/week   Plan Discharge plan remains appropriate;Frequency remains appropriate    Precautions / Restrictions Precautions Precautions: Cervical;Fall Required Braces or Orthoses: Cervical Brace Cervical Brace: Hard collar Restrictions RUE Weight Bearing: Non weight bearing RLE Weight Bearing: Non weight bearing   Pertinent Vitals/Pain     Mobility  Bed Mobility Bed Mobility: Supine to Sit Supine to Sit: 3: Mod assist Sitting - Scoot to Edge of Bed: 4: Min assist Details for Bed Mobility Assistance: pt lying on R side; unable to recognize that R UE should be NWB; required max cues for sequencing and to maintain NWB status on R UE and LE; pt cries out in pain with any movement; requires increased time Transfers Squat Pivot Transfers: 1: +2 Total assist Squat Pivot Transfers: Patient Percentage: 60% Details for Transfer Assistance: Patient squat pivot transfer from bed to recliner towards L side. Cueing throughout for precautions and positioning Ambulation/Gait Ambulation/Gait Assistance: Not tested (comment)    Exercises     PT Diagnosis:    PT Problem List:   PT  Treatment Interventions:     PT Goals Acute Rehab PT Goals PT Goal: Supine/Side to Sit - Progress: Progressing toward goal PT Goal: Sit to Stand - Progress: Progressing toward goal PT Transfer Goal: Bed to Chair/Chair to Bed - Progress: Progressing toward goal  Visit Information  Last PT Received On: 08/28/12 Assistance Needed: +2    Subjective Data      Cognition  Cognition Arousal/Alertness: Awake/alert Behavior During Therapy: Anxious Overall Cognitive Status: No family/caregiver present to determine baseline cognitive functioning    Balance     End of Session PT - End of Session Equipment Utilized During Treatment: Cervical collar Activity Tolerance: Patient limited by pain Patient left: with call bell/phone within reach;with family/visitor present;in chair Nurse Communication: Mobility status   GP     Fredrich Birks 08/28/2012, 12:01 PM 08/28/2012 Fredrich Birks PTA [phone] pager [phone] office

## 2012-08-28 NOTE — Progress Notes (Signed)
Patient ID: [name], male   DOB: Nov 24, 1980, 32 y.o.   MRN: [id number]   LOS: 10 days   Subjective: No new c/o.   Objective: Vital signs in last 24 hours: Temp:  [98.1 F (36.7 C)-98.6 F (37 C)] 98.5 F (36.9 C) (05/21 0658) Pulse Rate:  [86-99] 86 (05/21 0658) Resp:  [18] 18 (05/21 0658) BP: (129-151)/(76-79) 141/76 mmHg (05/21 0658) SpO2:  [95 %-99 %] 99 % (05/21 0658) Last BM Date: 08/24/12   Laboratory  CBC  Recent Labs  08/27/12 0355 08/28/12 0600  WBC 8.2 7.5  HGB 9.7* 9.4*  HCT 27.6* 27.4*  PLT 91* 134*   Lab Results  Component Value Date   INR 1.32 08/28/2012   INR 1.96* 08/27/2012   INR 1.63* 08/26/2012   APTT: 70   Physical Exam General appearance: alert and no distress Resp: clear to auscultation bilaterally Cardio: regular rate and rhythm GI: normal findings: bowel sounds normal and soft, non-tender   Assessment/Plan: PHBC  Concussion  Scalp lac  C2 Fx - C-collar  R clavicle Fx - S/P ORIF  R tibial plateau Fx - S/P ORIF  L carotid dissection - Argatroban  HIT -- Argatroban therapeutic, awaiting plts to rise above 150k ABL anemia -- Stable  EtOH  Multiple medical problems -- Home meds  FEN -- Pain controlled, check BMET tomorrow for Na+ VTE -- Argatroban Dispo - SNF once coumadin therapeutic x3d    Freeman Caldron, PA-C Pager: [phone] General Trauma PA Pager: [phone]   08/28/2012

## 2012-08-28 NOTE — Progress Notes (Signed)
Patient ID: [name], male   DOB: 1980/04/22, 32 y.o.   MRN: [id number] BP 149/80  Pulse 80  Temp(Src) 98.1 F (36.7 C) (Oral)  Resp 18  Ht 5\' 8"  (1.727 m)  Wt 68.04 kg (150 lb)  BMI 22.81 kg/m2  SpO2 98% Stable exam. No new recs.
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
## 2012-08-29 DIAGNOSIS — E871 Hypo-osmolality and hyponatremia: Secondary | ICD-10-CM

## 2012-08-29 LAB — CBC
HCT: 28.3 % — ABNORMAL LOW (ref 39.0–52.0)
Hemoglobin: 10 g/dL — ABNORMAL LOW (ref 13.0–17.0)
RBC: 3.25 MIL/uL — ABNORMAL LOW (ref 4.22–5.81)

## 2012-08-29 LAB — APTT
aPTT: 133 s — ABNORMAL HIGH (ref 24–37)
aPTT: 48 s — ABNORMAL HIGH (ref 24–37)
aPTT: 50 s — ABNORMAL HIGH (ref 24–37)

## 2012-08-29 LAB — BASIC METABOLIC PANEL
CO2: 21 mEq/L (ref 19–32)
Chloride: 85 mEq/L — ABNORMAL LOW (ref 96–112)
Glucose, Bld: 129 mg/dL — ABNORMAL HIGH (ref 70–99)
Potassium: 4 mEq/L (ref 3.5–5.1)
Sodium: 122 mEq/L — ABNORMAL LOW (ref 135–145)

## 2012-08-29 MED ORDER — MAGNESIUM CITRATE PO SOLN
1.0000 | Freq: Two times a day (BID) | ORAL | Status: DC
  Administered 2012-08-29 (×2): 1 via ORAL
  Filled 2012-08-29 (×18): qty 296

## 2012-08-29 MED ORDER — POLYETHYLENE GLYCOL 3350 17 G PO PACK
17.0000 g | PACK | Freq: Every day | ORAL | Status: DC
  Administered 2012-08-29 – 2012-09-04 (×5): 17 g via ORAL
  Filled 2012-08-29 (×9): qty 1

## 2012-08-29 MED ORDER — WARFARIN SODIUM 10 MG PO TABS
10.0000 mg | ORAL_TABLET | Freq: Once | ORAL | Status: AC
  Administered 2012-08-29: 10 mg via ORAL
  Filled 2012-08-29: qty 1

## 2012-08-29 MED ORDER — SODIUM CHLORIDE 1 G PO TABS
2.0000 g | ORAL_TABLET | Freq: Three times a day (TID) | ORAL | Status: DC
  Administered 2012-08-29 – 2012-09-06 (×19): 2 g via ORAL
  Filled 2012-08-29 (×28): qty 2
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
## 2012-08-29 NOTE — Progress Notes (Signed)
Physical Therapy Treatment Patient Details Name: [name] MRN: [id number] DOB: [birth year] Today's Date: 08/29/2012 Time: 1050-1109 PT Time Calculation (min): 19 min  PT Assessment / Plan / Recommendation Comments on Treatment Session  Patient able to increase tolerance with therapy and transferring today, only yelling out once and smiling and laughing at the end of session. Patient awaiting placement at this time. Will decrease frequency per PT protocol    Follow Up Recommendations  SNF;Supervision/Assistance - 24 hour     Does the patient have the potential to tolerate intense rehabilitation     Barriers to Discharge        Equipment Recommendations  Wheelchair (measurements PT);Wheelchair cushion (measurements PT);Rolling walker with 5" wheels    Recommendations for Other Services    Frequency Min 3X/week   Plan Discharge plan remains appropriate;Frequency needs to be updated    Precautions / Restrictions Precautions Precautions: Cervical;Fall Required Braces or Orthoses: Cervical Brace Cervical Brace: Hard collar Restrictions Weight Bearing Restrictions: Yes RUE Weight Bearing: Non weight bearing RLE Weight Bearing: Non weight bearing   Pertinent Vitals/Pain     Mobility  Bed Mobility Supine to Sit: 3: Mod assist Sitting - Scoot to Edge of Bed: 4: Min assist Details for Bed Mobility Assistance: Patient able to assist with moving R leg to EOB. Requires assistance for trunk support into sitting and cueing for RUE NWB Transfers Transfers: Stand Pivot Transfers Stand Pivot Transfers: 1: +2 Total assist Stand Pivot Transfers: Patient Percentage: 60% Details for Transfer Assistance: Patient stand pivot transfer from bed to recliner towards L side. Cueing throughout for precautions and positioning Ambulation/Gait Ambulation/Gait Assistance: Not tested (comment)    Exercises     PT Diagnosis:    PT Problem List:   PT Treatment Interventions:     PT Goals Acute  Rehab PT Goals PT Goal: Supine/Side to Sit - Progress: Progressing toward goal PT Transfer Goal: Bed to Chair/Chair to Bed - Progress: Progressing toward goal  Visit Information  Last PT Received On: 08/29/12 Assistance Needed: +2 (transfer)    Subjective Data      Cognition  Cognition Arousal/Alertness: Awake/alert Behavior During Therapy: WFL for tasks assessed/performed Overall Cognitive Status: Within Functional Limits for tasks assessed    Balance     End of Session PT - End of Session Equipment Utilized During Treatment: Cervical collar Activity Tolerance: Patient tolerated treatment well Patient left: with call bell/phone within reach;with family/visitor present;in chair Nurse Communication: Mobility status   GP     Fredrich Birks 08/29/2012, 11:49 AM 08/29/2012 Fredrich Birks PTA [phone] pager [phone] office

## 2012-08-29 NOTE — Progress Notes (Signed)
He drank Mg citrate.  Just finished so no BM yet. His family member from [place] is here and she requests, and he agrees, with SNF placement in [place] to be closer to family.  I D/W our CSW. Patient examined and I agree with the assessment and plan  Violeta Gelinas, MD, MPH, FACS Pager: [phone]  08/29/2012 10:53 AM

## 2012-08-29 NOTE — Progress Notes (Signed)
Patient ID: [name], male   DOB: March 01, 1981, 32 y.o.   MRN: [id number]   LOS: 11 days   Subjective: Says he feels terrible this morning, hard to articulate but does say his abdomen is hurting.   Objective: Vital signs in last 24 hours: Temp:  [97.6 F (36.4 C)-98.9 F (37.2 C)] 97.6 F (36.4 C) (05/22 0600) Pulse Rate:  [80-97] 97 (05/22 0600) Resp:  [18-20] 20 (05/22 0600) BP: (149-169)/(80-96) 169/96 mmHg (05/22 0600) SpO2:  [97 %-98 %] 97 % (05/22 0600) Last BM Date: 08/24/12   Laboratory  CBC  Recent Labs  08/28/12 0600 08/29/12 0410  WBC 7.5 11.5*  HGB 9.4* 10.0*  HCT 27.4* 28.3*  PLT 134* 220   BMET  Recent Labs  08/27/12 0355 08/29/12 0410  NA 132* 122*  K 4.2 4.0  CL 93* 85*  CO2 28 21  GLUCOSE 134* 129*  BUN 6 7  CREATININE 0.71 0.49*  CALCIUM 9.8 9.8   APTT: 133   Physical Exam General appearance: alert and no distress Resp: clear to auscultation bilaterally Cardio: regular rate and rhythm GI: normal findings: bowel sounds diminished and soft, non-tender Extremities: NVI   Assessment/Plan: PHBC  Concussion  Scalp lac  C2 Fx - C-collar  R clavicle Fx - S/P ORIF  R tibial plateau Fx - S/P ORIF  L vertebral art dissection - Argatroban, restart coumadin  HIT -- Argatroban therapeutic ABL anemia -- Stable  EtOH  Hyponatremia -- Increase NaCl Multiple medical problems -- Home meds  FEN -- Encouraged mag citrate VTE -- Argatroban  Dispo - SNF once coumadin therapeutic x3d    Freeman Caldron, PA-C Pager: [phone] General Trauma PA Pager: [phone]   08/29/2012
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
## 2012-08-29 NOTE — Clinical Social Work Note (Signed)
Clinical Social Worker continuing to follow for support and discharge planning needs.  CSW spoke with patient, patient aunt, and patient father at bedside to discuss discharge options again.  Patient and patient family both agreed to extend bed search to [facility].  Patient aunt questioned availability at [facility] and [facility] - CSW called both facilities to inquire, however neither are currently accepting Medicaid only patients.  CSW remains with no bed offers at this time and awaiting [place] number following site visit.  CSW will continue to follow up with patient and patient aunt [name] [phone]) to discuss patient limited options and possible bed offers.  CSW remains available for support and to facilitate patient discharge needs once medically stable.  Macario Golds, [place] [phone]

## 2012-08-30 LAB — HEPARIN INDUCED THROMBOCYTOPENIA PNL
Heparin Induced Plt Ab: POSITIVE
Patient O.D.: 3.096
UFH High Dose UFH H: 0 %
UFH Low Dose 0.5 IU/mL: 98 %
UFH SRA Result: POSITIVE — AB

## 2012-08-30 LAB — CBC
HCT: 29 % — ABNORMAL LOW (ref 39.0–52.0)
MCH: 30.4 pg (ref 26.0–34.0)
MCV: 88.1 fL (ref 78.0–100.0)
RDW: 12.3 % (ref 11.5–15.5)
WBC: 10.6 10*3/uL — ABNORMAL HIGH (ref 4.0–10.5)

## 2012-08-30 LAB — PROTIME-INR: INR: 1.42 (ref 0.00–1.49)

## 2012-08-30 MED ORDER — WARFARIN SODIUM 10 MG PO TABS
10.0000 mg | ORAL_TABLET | Freq: Once | ORAL | Status: AC
  Administered 2012-08-30: 10 mg via ORAL
  Filled 2012-08-30: qty 1
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
## 2012-08-30 NOTE — Progress Notes (Signed)
Mag citrate given twice per order yesterday. No results. Dulcolax suppository given at 0530 today, assisted pt to [facility] with large results. Approximately 400 cc loose/watery with formed stool noted. Pt stated he felt much better and had no tenderness in abd afterward. Will continue to monitor.

## 2012-08-30 NOTE — Progress Notes (Signed)
Patient ID: [name], male   DOB: Dec 23, 1980, 32 y.o.   MRN: [id number]   LOS: 12 days   Subjective: No new c/o. Apparently had some success with enema this morning.   Objective: Vital signs in last 24 hours: Temp:  [98 F (36.7 C)-98.8 F (37.1 C)] 98.8 F (37.1 C) (05/23 0710) Pulse Rate:  [59-91] 62 (05/23 0710) Resp:  [20] 20 (05/23 0710) BP: (136-155)/(82-91) 136/82 mmHg (05/23 0710) SpO2:  [97 %-99 %] 98 % (05/23 0710) Last BM Date: 08/24/12   Laboratory  CBC  Recent Labs  08/29/12 0410 08/30/12 0140  WBC 11.5* 10.6*  HGB 10.0* 10.0*  HCT 28.3* 29.0*  PLT 220 273   Lab Results  Component Value Date   INR 1.42 08/30/2012   INR 1.32 08/28/2012   INR 1.96* 08/27/2012   PTT: 52   Physical Exam General appearance: alert and no distress Resp: clear to auscultation bilaterally Cardio: regular rate and rhythm GI: normal findings: bowel sounds normal and soft, non-tender   Assessment/Plan: PHBC  Concussion  Scalp lac  C2 Fx - C-collar  R clavicle Fx - S/P ORIF  R tibial plateau Fx - S/P ORIF  L vertebral art dissection - Argatroban, coumadin  HIT -- Argatroban therapeutic  ABL anemia -- Stable  EtOH  Hyponatremia -- NaCl, check BMET tomorrow Multiple medical problems -- Home meds  FEN -- No issues VTE -- Argatroban, coumadin Dispo - SNF once coumadin therapeutic x3d    Freeman Caldron, PA-C Pager: [phone] General Trauma PA Pager: [phone]   08/30/2012
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
## 2012-08-31 LAB — BASIC METABOLIC PANEL
Calcium: 9.6 mg/dL (ref 8.4–10.5)
GFR calc Af Amer: >90 mL/min (ref >90)
GFR calc non Af Amer: >90 mL/min (ref >90)
Potassium: 4.5 mEq/L (ref 3.5–5.1)
Sodium: 123 mEq/L — ABNORMAL LOW (ref 135–145)

## 2012-08-31 LAB — CBC
Hemoglobin: 9.4 g/dL — ABNORMAL LOW (ref 13.0–17.0)
Platelets: 354 10*3/uL (ref 150–400)
RBC: 3.16 MIL/uL — ABNORMAL LOW (ref 4.22–5.81)
WBC: 10.3 10*3/uL (ref 4.0–10.5)

## 2012-08-31 LAB — APTT: aPTT: 51 s — ABNORMAL HIGH (ref 24–37)

## 2012-08-31 LAB — PROTIME-INR: INR: 1.73 — ABNORMAL HIGH (ref 0.00–1.49)

## 2012-08-31 MED ORDER — WARFARIN VIDEO
Freq: Once | Status: AC
  Administered 2012-08-31: 14:00:00

## 2012-08-31 MED ORDER — WARFARIN SODIUM 10 MG PO TABS
10.0000 mg | ORAL_TABLET | Freq: Once | ORAL | Status: AC
  Administered 2012-08-31: 10 mg via ORAL
  Filled 2012-08-31: qty 1

## 2012-08-31 MED ORDER — PATIENT'S GUIDE TO USING COUMADIN BOOK
Freq: Once | Status: AC
  Administered 2012-08-31: 14:00:00
  Filled 2012-08-31: qty 1
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
## 2012-08-31 NOTE — Progress Notes (Signed)
Patient ID: [name], male   DOB: 05/19/1980, 32 y.o.   MRN: [id number] 10 Days Post-Op  Subjective: Pt feels ok.  Had a BM this morning.  Still feels abdomen is bloated, but no nausea.  Tolerating a regular diet.  Pain controlled.  Objective: Vital signs in last 24 hours: Temp:  [97.7 F (36.5 C)-100 F (37.8 C)] 97.9 F (36.6 C) (05/24 0654) Pulse Rate:  [102-104] 104 (05/24 0654) Resp:  [18] 18 (05/24 0654) BP: (125-130)/(82-87) 130/82 mmHg (05/24 0654) SpO2:  [99 %-100 %] 100 % (05/24 0654) Last BM Date: 08/30/12  Intake/Output from previous day: 05/23 0701 - 05/24 0700 In: 840 [P.O.:840] Out: 1225 [Urine:1225] Intake/Output this shift:    PE: General: NAD HEENT: scalp lac healing well, c-collar in place Heart: regular Lungs: CTAB Abd: soft, but distended, +BS  Lab Results:   Recent Labs  08/30/12 0140 08/31/12 0410  WBC 10.6* 10.3  HGB 10.0* 9.4*  HCT 29.0* 27.8*  PLT 273 354   BMET  Recent Labs  08/29/12 0410 08/31/12 0410  NA 122* 123*  K 4.0 4.5  CL 85* 88*  CO2 21 22  GLUCOSE 129* 93  BUN 7 11  CREATININE 0.49* 0.67  CALCIUM 9.8 9.6   PT/INR  Recent Labs  08/30/12 0140 08/31/12 0410  LABPROT 17.0* 19.7*  INR 1.42 1.73*   CMP     Component Value Date/Time   NA 123* 08/31/2012 0410   K 4.5 08/31/2012 0410   CL 88* 08/31/2012 0410   CO2 22 08/31/2012 0410   GLUCOSE 93 08/31/2012 0410   BUN 11 08/31/2012 0410   CREATININE 0.67 08/31/2012 0410   CALCIUM 9.6 08/31/2012 0410   PROT 7.7 08/27/2012 0355   ALBUMIN 3.5 08/27/2012 0355   AST 16 08/27/2012 0355   ALT 24 08/27/2012 0355   ALKPHOS 84 08/27/2012 0355   BILITOT 0.5 08/27/2012 0355   GFRNONAA >90 08/31/2012 0410   GFRAA >90 08/31/2012 0410   Lipase     Component Value Date/Time   LIPASE 24 08/18/2012 2140       Studies/Results: No results found.  Anti-infectives: Anti-infectives   Start     Dose/Rate Route Frequency Ordered Stop   08/22/12 0900  ceFAZolin (ANCEF) IVPB 1  g/50 mL premix  Status:  Discontinued     1 g 100 mL/hr over 30 Minutes Intravenous 4 times per day 08/21/12 2255 08/21/12 2259   08/21/12 2300  ceFAZolin (ANCEF) IVPB 1 g/50 mL premix     1 g 100 mL/hr over 30 Minutes Intravenous 4 times per day 08/21/12 2259 08/22/12 0709   08/21/12 2100  ceFAZolin (ANCEF) IVPB 1 g/50 mL premix  Status:  Discontinued     1 g 100 mL/hr over 30 Minutes Intravenous Every 6 hours 08/21/12 2047 08/21/12 2255   08/20/12 0830  Darunavir Ethanolate (PREZISTA) tablet 800 mg     800 mg Oral Daily with breakfast 08/19/12 1232     08/20/12 0830  ritonavir (NORVIR) tablet 100 mg     100 mg Oral Daily with breakfast 08/19/12 1232     08/19/12 1315  emtricitabine-tenofovir (TRUVADA) 200-300 MG per tablet 1 tablet     1 tablet Oral Daily 08/19/12 1232     08/19/12 1300  ceFAZolin (ANCEF) IVPB 2 g/50 mL premix     2 g 100 mL/hr over 30 Minutes Intravenous  Once 08/19/12 1151 08/19/12 1627       Assessment/Plan   PHBC  Concussion  Scalp lac  C2 Fx - C-collar  R clavicle Fx - S/P ORIF  R tibial plateau Fx - S/P ORIF  L vertebral art dissection - Argatroban, coumadin  HIT -- Argatroban therapeutic  ABL anemia -- Stable, dropped slightly, will follow EtOH  Hyponatremia -- NaCl, check BMET tomorrow and follow.  Not much change today.  Na only 123 from 122 Multiple medical problems -- Home meds  FEN -- No issues  VTE -- Argatroban, coumadin  Dispo - SNF once coumadin therapeutic x3d   LOS: 13 days    Asheton Viramontes E 08/31/2012, 10:36 AM Pager: [phone]
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
## 2012-08-31 NOTE — Progress Notes (Signed)
Seen and agree.  Follow sodium and hgb for now.

## 2012-09-01 LAB — CBC
Platelets: 467 10*3/uL — ABNORMAL HIGH (ref 150–400)
RBC: 3.08 MIL/uL — ABNORMAL LOW (ref 4.22–5.81)
WBC: 7.7 10*3/uL (ref 4.0–10.5)

## 2012-09-01 LAB — APTT: aPTT: 74 s — ABNORMAL HIGH (ref 24–37)

## 2012-09-01 LAB — PROTIME-INR
INR: 2.2 — ABNORMAL HIGH (ref 0.00–1.49)
Prothrombin Time: 23.5 seconds — ABNORMAL HIGH (ref 11.6–15.2)

## 2012-09-01 LAB — BASIC METABOLIC PANEL
Calcium: 9.6 mg/dL (ref 8.4–10.5)
GFR calc Af Amer: >90 mL/min (ref >90)
GFR calc non Af Amer: >90 mL/min (ref >90)
Glucose, Bld: 111 mg/dL — ABNORMAL HIGH (ref 70–99)
Sodium: 121 mEq/L — ABNORMAL LOW (ref 135–145)

## 2012-09-01 MED ORDER — SODIUM CHLORIDE 0.9 % IV SOLN
INTRAVENOUS | Status: DC
  Administered 2012-09-01: 50 mL/h via INTRAVENOUS
  Administered 2012-09-02 – 2012-09-03 (×2): via INTRAVENOUS

## 2012-09-01 MED ORDER — WARFARIN SODIUM 10 MG PO TABS
10.0000 mg | ORAL_TABLET | Freq: Once | ORAL | Status: AC
  Administered 2012-09-01: 10 mg via ORAL
  Filled 2012-09-01: qty 1
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
## 2012-09-01 NOTE — Progress Notes (Signed)
Patient ID: [name], male   DOB: 1980/04/27, 32 y.o.   MRN: [id number] 11 Days Post-Op  Subjective: Pt feels ok today.  Forgetful.  Eating and having BMs  Objective: Vital signs in last 24 hours: Temp:  [98.3 F (36.8 C)-99.2 F (37.3 C)] 98.3 F (36.8 C) (05/25 0621) Pulse Rate:  [85-92] 86 (05/25 0621) Resp:  [16-18] 16 (05/25 0621) BP: (127-144)/(73-85) 127/73 mmHg (05/25 0621) SpO2:  [98 %-99 %] 99 % (05/25 0621) Last BM Date: 08/31/12  Intake/Output from previous day: 05/24 0701 - 05/25 0700 In: 824.8 [P.O.:720; I.V.:104.8] Out: -  Intake/Output this shift:    PE: Gen: NAD Heart: regular Lungs: CTAB Abd: soft, still with some distention, but good BS  Lab Results:   Recent Labs  08/31/12 0410 09/01/12 0558  WBC 10.3 7.7  HGB 9.4* 9.2*  HCT 27.8* 26.7*  PLT 354 467*   BMET  Recent Labs  08/31/12 0410 09/01/12 0558  NA 123* 121*  K 4.5 4.1  CL 88* 88*  CO2 22 21  GLUCOSE 93 111*  BUN 11 10  CREATININE 0.67 0.62  CALCIUM 9.6 9.6   PT/INR  Recent Labs  08/31/12 0410 09/01/12 0558  LABPROT 19.7* 23.5*  INR 1.73* 2.20*   CMP     Component Value Date/Time   NA 121* 09/01/2012 0558   K 4.1 09/01/2012 0558   CL 88* 09/01/2012 0558   CO2 21 09/01/2012 0558   GLUCOSE 111* 09/01/2012 0558   BUN 10 09/01/2012 0558   CREATININE 0.62 09/01/2012 0558   CALCIUM 9.6 09/01/2012 0558   PROT 7.7 08/27/2012 0355   ALBUMIN 3.5 08/27/2012 0355   AST 16 08/27/2012 0355   ALT 24 08/27/2012 0355   ALKPHOS 84 08/27/2012 0355   BILITOT 0.5 08/27/2012 0355   GFRNONAA >90 09/01/2012 0558   GFRAA >90 09/01/2012 0558   Lipase     Component Value Date/Time   LIPASE 24 08/18/2012 2140       Studies/Results: No results found.  Anti-infectives: Anti-infectives   Start     Dose/Rate Route Frequency Ordered Stop   08/22/12 0900  ceFAZolin (ANCEF) IVPB 1 g/50 mL premix  Status:  Discontinued     1 g 100 mL/hr over 30 Minutes Intravenous 4 times per day 08/21/12  2255 08/21/12 2259   08/21/12 2300  ceFAZolin (ANCEF) IVPB 1 g/50 mL premix     1 g 100 mL/hr over 30 Minutes Intravenous 4 times per day 08/21/12 2259 08/22/12 0709   08/21/12 2100  ceFAZolin (ANCEF) IVPB 1 g/50 mL premix  Status:  Discontinued     1 g 100 mL/hr over 30 Minutes Intravenous Every 6 hours 08/21/12 2047 08/21/12 2255   08/20/12 0830  Darunavir Ethanolate (PREZISTA) tablet 800 mg     800 mg Oral Daily with breakfast 08/19/12 1232     08/20/12 0830  ritonavir (NORVIR) tablet 100 mg     100 mg Oral Daily with breakfast 08/19/12 1232     08/19/12 1315  emtricitabine-tenofovir (TRUVADA) 200-300 MG per tablet 1 tablet     1 tablet Oral Daily 08/19/12 1232     08/19/12 1300  ceFAZolin (ANCEF) IVPB 2 g/50 mL premix     2 g 100 mL/hr over 30 Minutes Intravenous  Once 08/19/12 1151 08/19/12 1627       Assessment/Plan PHBC  Concussion  Scalp lac  C2 Fx - C-collar  R clavicle Fx - S/P ORIF  R tibial plateau Fx -  S/P ORIF  L vertebral art dissection - Argatroban, coumadin  HIT -- Argatroban therapeutic  ABL anemia -- Stable, dropped slightly, will follow  EtOH  Hyponatremia -- NaCl, add NS at 50cc/hr to see if we can get NA up Multiple medical problems -- Home meds  FEN -- No issues  VTE -- Argatroban, coumadin  Dispo - SNF once coumadin therapeutic x3d   LOS: 14 days    [name] E 09/01/2012, 11:32 AM Pager: [phone]

## 2012-09-01 NOTE — Progress Notes (Signed)
I have seen and examined the pt and agree with PA-Osborne's progress note.  

## 2012-09-02 LAB — CBC
MCH: 29.5 pg (ref 26.0–34.0)
Platelets: 606 10*3/uL — ABNORMAL HIGH (ref 150–400)
RBC: 3.05 MIL/uL — ABNORMAL LOW (ref 4.22–5.81)
WBC: 5.6 10*3/uL (ref 4.0–10.5)

## 2012-09-02 LAB — APTT: aPTT: 65 s — ABNORMAL HIGH (ref 24–37)

## 2012-09-02 LAB — BASIC METABOLIC PANEL
Calcium: 9.6 mg/dL (ref 8.4–10.5)
GFR calc non Af Amer: >90 mL/min (ref >90)
Glucose, Bld: 101 mg/dL — ABNORMAL HIGH (ref 70–99)
Sodium: 123 mEq/L — ABNORMAL LOW (ref 135–145)

## 2012-09-02 MED ORDER — WARFARIN SODIUM 7.5 MG PO TABS
15.0000 mg | ORAL_TABLET | Freq: Once | ORAL | Status: AC
  Administered 2012-09-02: 15 mg via ORAL
  Filled 2012-09-02: qty 2

## 2012-09-02 NOTE — Progress Notes (Signed)
Patient ID: [name], male   DOB: 1981-01-27, 32 y.o.   MRN: [id number] 12 Days Post-Op  Subjective: Pt c/o collar is in his way of eating.  Otherwise no c/o  Objective: Vital signs in last 24 hours: Temp:  [98.2 F (36.8 C)-98.4 F (36.9 C)] 98.4 F (36.9 C) (05/26 0621) Pulse Rate:  [86-93] 88 (05/26 0621) Resp:  [16] 16 (05/26 0621) BP: (140-145)/(77-80) 140/80 mmHg (05/26 1137) SpO2:  [99 %-100 %] 99 % (05/26 0621) Last BM Date: 08/31/12  Intake/Output from previous day: 05/25 0701 - 05/26 0700 In: 524.9 [P.O.:120; I.V.:404.9] Out: 1275 [Urine:1275] Intake/Output this shift:    PE: GEN: NAD Heart: regular Lungs: CTAB  Lab Results:   Recent Labs  09/01/12 0558 09/02/12 0500  WBC 7.7 5.6  HGB 9.2* 9.0*  HCT 26.7* 26.5*  PLT 467* 606*   BMET  Recent Labs  09/01/12 0558 09/02/12 0500  NA 121* 123*  K 4.1 4.7  CL 88* 90*  CO2 21 23  GLUCOSE 111* 101*  BUN 10 9  CREATININE 0.62 0.61  CALCIUM 9.6 9.6   PT/INR  Recent Labs  09/01/12 0558 09/02/12 0500  LABPROT 23.5* 25.6*  INR 2.20* 2.47*   CMP     Component Value Date/Time   NA 123* 09/02/2012 0500   K 4.7 09/02/2012 0500   CL 90* 09/02/2012 0500   CO2 23 09/02/2012 0500   GLUCOSE 101* 09/02/2012 0500   BUN 9 09/02/2012 0500   CREATININE 0.61 09/02/2012 0500   CALCIUM 9.6 09/02/2012 0500   PROT 7.7 08/27/2012 0355   ALBUMIN 3.5 08/27/2012 0355   AST 16 08/27/2012 0355   ALT 24 08/27/2012 0355   ALKPHOS 84 08/27/2012 0355   BILITOT 0.5 08/27/2012 0355   GFRNONAA >90 09/02/2012 0500   GFRAA >90 09/02/2012 0500   Lipase     Component Value Date/Time   LIPASE 24 08/18/2012 2140       Studies/Results: No results found.  Anti-infectives: Anti-infectives   Start     Dose/Rate Route Frequency Ordered Stop   08/22/12 0900  ceFAZolin (ANCEF) IVPB 1 g/50 mL premix  Status:  Discontinued     1 g 100 mL/hr over 30 Minutes Intravenous 4 times per day 08/21/12 2255 08/21/12 2259   08/21/12 2300   ceFAZolin (ANCEF) IVPB 1 g/50 mL premix     1 g 100 mL/hr over 30 Minutes Intravenous 4 times per day 08/21/12 2259 08/22/12 0709   08/21/12 2100  ceFAZolin (ANCEF) IVPB 1 g/50 mL premix  Status:  Discontinued     1 g 100 mL/hr over 30 Minutes Intravenous Every 6 hours 08/21/12 2047 08/21/12 2255   08/20/12 0830  Darunavir Ethanolate (PREZISTA) tablet 800 mg     800 mg Oral Daily with breakfast 08/19/12 1232     08/20/12 0830  ritonavir (NORVIR) tablet 100 mg     100 mg Oral Daily with breakfast 08/19/12 1232     08/19/12 1315  emtricitabine-tenofovir (TRUVADA) 200-300 MG per tablet 1 tablet     1 tablet Oral Daily 08/19/12 1232     08/19/12 1300  ceFAZolin (ANCEF) IVPB 2 g/50 mL premix     2 g 100 mL/hr over 30 Minutes Intravenous  Once 08/19/12 1151 08/19/12 1627       Assessment/Plan PHBC  Concussion  Scalp lac  C2 Fx - C-collar  R clavicle Fx - S/P ORIF  R tibial plateau Fx - S/P ORIF  L vertebral art  dissection - Argatroban, coumadin  HIT -- Argatroban therapeutic  ABL anemia -- Stable EtOH  Hyponatremia -- NaCl, add NS at 50cc/hr.  Na up slightly today to 123, will follow Multiple medical problems -- Home meds  FEN -- No issues  VTE -- Argatroban, coumadin  Dispo - SNF once coumadin therapeutic x3d, 2 days therapeutic today     LOS: 15 days    Treyvion Durkee E 09/02/2012, 12:19 PM Pager: [phone]
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
## 2012-09-02 NOTE — Progress Notes (Signed)
Physical Therapy Treatment Patient Details Name: [name] MRN: [id number] DOB: [birth year] Today's Date: 09/02/2012 Time: 4098-1191 PT Time Calculation (min): 28 min  PT Assessment / Plan / Recommendation Comments on Treatment Session  Continuing improvements in activity tolerance, with the performance of 2 pivot transfers; Pt seemed pleased with progress    Follow Up Recommendations  SNF;Supervision/Assistance - 24 hour     Does the patient have the potential to tolerate intense rehabilitation     Barriers to Discharge        Equipment Recommendations  Wheelchair (measurements PT);Wheelchair cushion (measurements PT);Rolling walker with 5" wheels    Recommendations for Other Services    Frequency Min 3X/week   Plan Discharge plan remains appropriate;Frequency needs to be updated    Precautions / Restrictions Precautions Precautions: Cervical;Fall Precaution Comments: OT order for extension splint Rt. middle finger Required Braces or Orthoses: Cervical Brace Cervical Brace: Hard collar Restrictions RUE Weight Bearing: Non weight bearing RLE Weight Bearing: Non weight bearing   Pertinent Vitals/Pain 9/10 pain; was premedicated, repositioned as comfortably as possible in chair    Mobility  Bed Mobility Bed Mobility: Supine to Sit;Sitting - Scoot to Edge of Bed Supine to Sit: 3: Mod assist;HOB elevated Sitting - Scoot to Edge of Bed: 4: Min assist Details for Bed Mobility Assistance: Patient able to assist with moving R leg to EOB. Requires assistance for trunk support into sitting and cueing for RUE NWB Transfers Transfers: Stand Pivot Transfers Stand Pivot Transfers: 1: +2 Total assist Stand Pivot Transfers: Patient Percentage:  (70-60%) Details for Transfer Assistance: Stand pivot transfer towards Left side, bed to [facility], then BSC to recliner; Cues for technique, and especially for hand placement; Pt tended to try to use R UE for support, so placed RUE in sling and  educated pt in the need to be NWB RUE as well as LE; Pt became apprehensive during second stand pivot transfer, afraid of falling    Exercises     PT Diagnosis:    PT Problem List:   PT Treatment Interventions:     PT Goals Acute Rehab PT Goals Time For Goal Achievement: 09/05/12 Potential to Achieve Goals: Good Pt will go Supine/Side to Sit: with modified independence;with rail PT Goal: Supine/Side to Sit - Progress: Progressing toward goal Pt will go Sit to Stand: with supervision;with upper extremity assist PT Goal: Sit to Stand - Progress: Progressing toward goal Pt will Transfer Bed to Chair/Chair to Bed: with supervision PT Transfer Goal: Bed to Chair/Chair to Bed - Progress: Progressing toward goal  Visit Information  Last PT Received On: 09/02/12 Assistance Needed: +2 (+1 soon if shows good carryover)    Subjective Data  Subjective: Hesitant to move, but needed to use BSC, so agreeable to OOB   Cognition  Cognition Arousal/Alertness: Awake/alert Behavior During Therapy: WFL for tasks assessed/performed Overall Cognitive Status: Within Functional Limits for tasks assessed    Balance  Static Sitting Balance Static Sitting - Balance Support: Left upper extremity supported;Feet supported Static Sitting - Level of Assistance: 5: Stand by assistance Static Sitting - Comment/# of Minutes: 5-8 minutes at EOB  End of Session PT - End of Session Equipment Utilized During Treatment: Cervical collar;Gait belt Activity Tolerance: Patient tolerated treatment well Patient left: in chair;with call bell/phone within reach   GP     [facility] [place], [place] [phone]  09/02/2012, 12:26 PM

## 2012-09-03 LAB — CBC
MCH: 29.3 pg (ref 26.0–34.0)
MCHC: 34.3 g/dL (ref 30.0–36.0)
Platelets: 698 10*3/uL — ABNORMAL HIGH (ref 150–400)
RBC: 3.24 MIL/uL — ABNORMAL LOW (ref 4.22–5.81)

## 2012-09-03 LAB — BASIC METABOLIC PANEL
BUN: 9 mg/dL (ref 6–23)
CO2: 23 mEq/L (ref 19–32)
Calcium: 9.6 mg/dL (ref 8.4–10.5)
Glucose, Bld: 103 mg/dL — ABNORMAL HIGH (ref 70–99)
Potassium: 4.4 mEq/L (ref 3.5–5.1)
Sodium: 122 mEq/L — ABNORMAL LOW (ref 135–145)

## 2012-09-03 LAB — APTT: aPTT: 60 s — ABNORMAL HIGH (ref 24–37)

## 2012-09-03 MED ORDER — WARFARIN SODIUM 7.5 MG PO TABS
15.0000 mg | ORAL_TABLET | Freq: Once | ORAL | Status: AC
  Administered 2012-09-03: 15 mg via ORAL
  Filled 2012-09-03 (×2): qty 2

## 2012-09-03 NOTE — Progress Notes (Signed)
UR completed 

## 2012-09-03 NOTE — Progress Notes (Signed)
Ortho tech tried to put pt in sling and swath pt refused at this time.

## 2012-09-03 NOTE — Clinical Social Work Note (Signed)
Clinical Social Worker spoke with patient at length at bedside to explain no available bed offers at this time and the possibility of inpatient rehab.  CSW spoke with patient aunt over the phone who states that patient would be able to stay with her or his father at discharge and have 24 hour supervision.  Patient father unable to assist physically, however patient aunt is available to assist physically as needed.  Patient is now agreeable and open to potential rehab options with greater understanding of the program.  Patient and patient family express concerns about patient time frame for rehab options and discharge disposition.  CSW remains available for support and to continue to pursue SNF options.  CSW willing to be present when rehab conversation takes place to assist patient with understanding of the program and ability to participate.    [name], [place] [phone]

## 2012-09-03 NOTE — Progress Notes (Signed)
Physical Therapy Treatment Patient Details Name: [name] MRN: [id number] DOB: 1980/05/18 Today's Date: 09/03/2012 Time: 9562-1308 PT Time Calculation (min): 20 min  PT Assessment / Plan / Recommendation Comments on Treatment Session  Continuing improvements in participation, and ability to transfer; Still with some difficulty keeping NWBing RUE; Requesting sling and swathe in hopes of keeping NWB during transfers; Considering progress with transfers, will add wheelchair mobility goal; Spoke about [name] case with [name], SW; Considering pt's imporving ability to participate, and now with knowledge that he has options for dc to Father's home or Aunt's home, I believe a CIR stay to maximize independence and safety with mobility is a good option for [name]     Follow Up Recommendations  CIR     Does the patient have the potential to tolerate intense rehabilitation     Barriers to Discharge        Equipment Recommendations  Wheelchair (measurements PT);Wheelchair cushion (measurements PT)    Recommendations for Other Services Rehab consult  Frequency Min 4X/week   Plan Discharge plan needs to be updated;Frequency needs to be updated    Precautions / Restrictions Precautions Precautions: Cervical;Fall Precaution Comments: OT order for extension splint Rt. middle finger Required Braces or Orthoses: Cervical Brace Cervical Brace: Hard collar Restrictions Weight Bearing Restrictions: Yes RUE Weight Bearing: Non weight bearing RLE Weight Bearing: Non weight bearing Other Position/Activity Restrictions: Needs reinforcement of NWB RUE   Pertinent Vitals/Pain Did not rate, grimace with motion    Mobility  Bed Mobility Bed Mobility: Supine to Sit;Sitting - Scoot to Edge of Bed Supine to Sit: 4: Min assist;With rails Sitting - Scoot to [place] of Bed: 4: Min assist Details for Bed Mobility Assistance: Patient able to assist with moving R leg to EOB. Requires assistance  for trunk support into sitting and cueing for RUE NWB Transfers Transfers: Stand Pivot Transfers Stand Pivot Transfers: 1: +2 Total assist Stand Pivot Transfers: Patient Percentage: 70% (first rep 70%, second rep, 60%) Details for Transfer Assistance: Stand pivot transfer towards Left side, bed to [name], then BSC to recliner; Cues for technique, and especially for hand placement; Pt tended to try to use R UE for support, so placed RUE in sling and educated pt in the need to be NWB RUE as well as LE; Pt became apprehensive during second stand pivot transfer, afraid of falling    Exercises     PT Diagnosis:    PT Problem List:   PT Treatment Interventions:     PT Goals Acute Rehab PT Goals Time For Goal Achievement: 09/05/12 Potential to Achieve Goals: Good Pt will go Supine/Side to Sit: with modified independence;with rail PT Goal: Supine/Side to Sit - Progress: Progressing toward goal Pt will go Sit to Stand: with supervision;with upper extremity assist PT Goal: Sit to Stand - Progress: Progressing toward goal Pt will Transfer Bed to Chair/Chair to Bed: with supervision PT Transfer Goal: Bed to Chair/Chair to Bed - Progress: Progressing toward goal Pt will Propel Wheelchair: 51 - 150 feet;with supervision PT Goal: Propel Wheelchair - Progress: Progressing toward goal  Visit Information  Last PT Received On: 09/03/12 Assistance Needed: +2    Subjective Data  Subjective: Agreeable to OOB   Cognition  Cognition Arousal/Alertness: Awake/alert Behavior During Therapy: WFL for tasks assessed/performed Overall Cognitive Status: Within Functional Limits for tasks assessed    Balance     End of Session PT - End of Session Equipment Utilized During Treatment: Cervical collar;Gait belt Activity  Tolerance: Patient tolerated treatment well Patient left: in chair;with call bell/phone within reach   GP     [name] [name], [name] [phone]  09/03/2012, 12:16 PM
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
## 2012-09-03 NOTE — Progress Notes (Signed)
Patient ID: [name], male   DOB: Oct 04, 1980, 32 y.o.   MRN: [id number]   LOS: 16 days   Subjective: No new c/o   Objective: Vital signs in last 24 hours: Temp:  [98 F (36.7 C)-99.5 F (37.5 C)] 98 F (36.7 C) (05/27 0558) Pulse Rate:  [79-85] 81 (05/27 0558) Resp:  [18] 18 (05/27 0558) BP: (137-142)/(78-80) 139/78 mmHg (05/27 0558) SpO2:  [98 %-100 %] 100 % (05/27 0558) Last BM Date: 09/02/12   Laboratory  CBC  Recent Labs  09/02/12 0500 09/03/12 0530  WBC 5.6 4.8  HGB 9.0* 9.5*  HCT 26.5* 27.7*  PLT 606* 698*   BMET  Recent Labs  09/02/12 0500 09/03/12 0530  NA 123* 122*  K 4.7 4.4  CL 90* 88*  CO2 23 23  GLUCOSE 101* 103*  BUN 9 9  CREATININE 0.61 0.58  CALCIUM 9.6 9.6   Lab Results  Component Value Date   INR 2.30* 09/03/2012   INR 2.47* 09/02/2012   INR 2.20* 09/01/2012   PTT: 60   Physical Exam General appearance: alert and no distress Resp: clear to auscultation bilaterally Cardio: regular rate and rhythm GI: normal findings: bowel sounds normal and soft, non-tender   Assessment/Plan: PHBC  Concussion  Scalp lac  C2 Fx - C-collar  R clavicle Fx - S/P ORIF  R tibial plateau Fx - S/P ORIF  L vertebral art dissection - Argatroban, coumadin  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 Multiple medical problems -- Home meds  FEN -- No issues  VTE -- Argatroban, coumadin  Dispo - SNF once coumadin therapeutic x3d    Freeman Caldron, PA-C Pager: [phone] General Trauma PA Pager: [phone]   09/03/2012

## 2012-09-03 NOTE — Progress Notes (Signed)
Active bed search remains in place;  CSW left message for Admissions at [facility]us who is "considering" patient.  Currently no bed offers in place.  CSW will continue to monitor.  Lorri Frederick. [place]  [phone]

## 2012-09-04 LAB — CBC
Hemoglobin: 10.1 g/dL — ABNORMAL LOW (ref 13.0–17.0)
MCH: 29.4 pg (ref 26.0–34.0)
MCHC: 34.8 g/dL (ref 30.0–36.0)
MCV: 84.5 fL (ref 78.0–100.0)
RBC: 3.43 MIL/uL — ABNORMAL LOW (ref 4.22–5.81)

## 2012-09-04 MED ORDER — WARFARIN SODIUM 7.5 MG PO TABS
17.5000 mg | ORAL_TABLET | Freq: Once | ORAL | Status: AC
  Administered 2012-09-04: 17.5 mg via ORAL
  Filled 2012-09-04: qty 1

## 2012-09-04 NOTE — Progress Notes (Signed)
Received a call from SW regarding CIR. Patient will not be at supervision level after short rehab stay. He has baseline cognitive deficits likely exacerbate by new BI. He has been limited in therapy but finally  have a good day yesterday. Doubt his ability to be compliant with NWB RUE/RLE as well as need for collar without max cueing. Concerns relayed regarding patient's ability to make appropriate/safe decisions as well as family's ability to provide 24 hours supervision/assistance expressed. He would be better served in SNF setting which will give him the time he needs to heal and provide a safe environment.

## 2012-09-04 NOTE — Progress Notes (Signed)
Patient ID: [name], male   DOB: 01/30/1981, 32 y.o.   MRN: [id number]   LOS: 17 days   Subjective: Wanted to smoke, I explained about the policy. Wants a shower.   Objective: Vital signs in last 24 hours: Temp:  [98.2 F (36.8 C)-98.5 F (36.9 C)] 98.3 F (36.8 C) (05/28 0507) Pulse Rate:  [75-85] 75 (05/28 0507) Resp:  [16-20] 16 (05/28 0507) BP: (120-130)/(70-80) 120/70 mmHg (05/28 0507) SpO2:  [95 %-100 %] 99 % (05/28 0507) Last BM Date: 09/02/12   Laboratory  CBC  Recent Labs  09/03/12 0530 09/04/12 0445  WBC 4.8 5.2  HGB 9.5* 10.1*  HCT 27.7* 29.0*  PLT 698* 754*   Lab Results  Component Value Date   INR 2.64* 09/04/2012   INR 2.30* 09/03/2012   INR 2.47* 09/02/2012   PTT: 62   Physical Exam General appearance: alert and no distress Resp: clear to auscultation bilaterally Cardio: regular rate and rhythm GI: normal findings: bowel sounds normal and soft, non-tender   Assessment/Plan: PHBC  Concussion  Scalp lac  C2 Fx - C-collar  R clavicle Fx - S/P ORIF  R tibial plateau Fx - S/P ORIF  L vertebral art dissection - Argatroban, coumadin  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  Multiple medical problems -- Home meds  FEN -- No issues  VTE -- Argatroban, coumadin  Dispo - SNF once coumadin therapeutic    Freeman Caldron, PA-C Pager: [phone] General Trauma PA Pager: [phone]   09/04/2012

## 2012-09-04 NOTE — Progress Notes (Signed)
Patient examined and I agree with the assessment and plan Awaiting appropriate anticoagulation. Working on SNF placement. Violeta Gelinas, MD, MPH, FACS Pager: [phone]  09/04/2012 7:16 AM
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
## 2012-09-04 NOTE — Progress Notes (Signed)
Physical Therapy Treatment Patient Details Name: [name] MRN: [id number] DOB: 21-Aug-1980 Today's Date: 09/04/2012 Time: 4098-1191 PT Time Calculation (min): 41 min  PT Assessment / Plan / Recommendation Comments on Treatment Session  Able to initiate wheelchair training, and work on both stand-pivot and lateral scoot transfers to give pt more options for mobility; Using the swathe during transfers seemed to help cue pt not to use his RUE for WBing; continue to recommend postacute rehab    Follow Up Recommendations  CIR     Does the patient have the potential to tolerate intense rehabilitation     Barriers to Discharge        Equipment Recommendations  Wheelchair (measurements PT);Wheelchair cushion (measurements PT)    Recommendations for Other Services Rehab consult  Frequency Min 4X/week   Plan Discharge plan remains appropriate    Precautions / Restrictions Precautions Precautions: Cervical;Fall Required Braces or Orthoses: Cervical Brace Cervical Brace: Hard collar Restrictions RUE Weight Bearing: Non weight bearing RLE Weight Bearing: Non weight bearing Other Position/Activity Restrictions: Needs reinforcement of NWB RUE   Pertinent Vitals/Pain 8/10 pain; Notified RN    Mobility  Bed Mobility Bed Mobility: Supine to Sit;Sitting - Scoot to Edge of Bed Supine to Sit: 4: Min assist;With rails Sitting - Scoot to [place] of Bed: 4: Min assist Details for Bed Mobility Assistance: Cues t avoid rolling onto R side; Able t move LEs off of the bed without physical assist; Pulled up with handheld assist Lhand from PT Transfers Transfers: Stand Pivot Transfers;Lateral/Scoot Transfers Stand Pivot Transfers: 3: Mod assist Lateral/Scoot Transfers: 4: Min assist;With armrests removed Details for Transfer Assistance: Lateral scoot transfer bed to wheelchair, slow, inefficient movement, but only requiring min assist; stand pivot transfer wheelchair to shower bench; Pt apprehensive  with transfer to bench as bench was quite low Wheelchair Mobility Wheelchair Mobility: Yes Wheelchair Assistance: 3: Mod assist;2: Max Chiropodist Details (indicate cue type and reason): Cues for technique to use RLE for steering/propulsion, and RUE for propulsion Wheelchair Propulsion: Left upper extremity;Left lower extremity Wheelchair Parts Management: Needs assistance Distance: 15 (with many turns and obstacles getting to bathroom)    Exercises     PT Diagnosis:    PT Problem List:   PT Treatment Interventions:     PT Goals Acute Rehab PT Goals Time For Goal Achievement: 09/12/12 (This will serve as a goal update) Potential to Achieve Goals: Good Pt will go Supine/Side to Sit: with modified independence;with rail PT Goal: Supine/Side to Sit - Progress: Progressing toward goal Pt will go Sit to Stand: with supervision;with upper extremity assist PT Goal: Sit to Stand - Progress: Progressing toward goal Pt will Transfer Bed to Chair/Chair to Bed: with supervision PT Transfer Goal: Bed to Chair/Chair to Bed - Progress: Progressing toward goal Pt will Perform Home Exercise Program: with supervision, verbal cues required/provided PT Goal: Perform Home Exercise Program - Progress: Progressing toward goal Pt will Propel Wheelchair: 51 - 150 feet;with supervision PT Goal: Propel Wheelchair - Progress: Progressing toward goal  Above goals continue to be appropriate  Visit Information  Last PT Received On: 09/04/12 Assistance Needed: +2    Subjective Data  Subjective: Wanting a shower Patient Stated Goal: is agreeable to Rehab   Cognition  Cognition Arousal/Alertness: Awake/alert Behavior During Therapy: WFL for tasks assessed/performed Overall Cognitive Status: Within Functional Limits for tasks assessed    Balance     End of Session PT - End of Session Equipment Utilized During Treatment: Cervical collar;Gait  belt Activity Tolerance: Patient tolerated  treatment well Patient left: with nursing in room (preparing for a shower) Nurse Communication: Mobility status   GP     [facility] [place], [place] [phone]'  09/04/2012, 4:49 PM

## 2012-09-04 NOTE — Progress Notes (Signed)
This patient has been seen and I agree with the findings and treatment plan.  Dawnmarie Breon O. Chrisangel Eskenazi, III, MD, FACS [phone] (pager) [phone] (direct pager) Trauma Surgeon  

## 2012-09-05 LAB — CBC
HCT: 29.9 % — ABNORMAL LOW (ref 39.0–52.0)
MCV: 85.9 fL (ref 78.0–100.0)
RBC: 3.48 MIL/uL — ABNORMAL LOW (ref 4.22–5.81)
WBC: 4.5 10*3/uL (ref 4.0–10.5)

## 2012-09-05 LAB — BASIC METABOLIC PANEL
CO2: 24 mEq/L (ref 19–32)
Calcium: 10.2 mg/dL (ref 8.4–10.5)
Chloride: 88 mEq/L — ABNORMAL LOW (ref 96–112)
Potassium: 4.7 mEq/L (ref 3.5–5.1)
Sodium: 123 mEq/L — ABNORMAL LOW (ref 135–145)

## 2012-09-05 LAB — PROTIME-INR: INR: 3.2 — ABNORMAL HIGH (ref 0.00–1.49)

## 2012-09-05 MED ORDER — POLYETHYLENE GLYCOL 3350 17 G PO PACK
17.0000 g | PACK | Freq: Two times a day (BID) | ORAL | Status: DC
  Administered 2012-09-05: 17 g via ORAL
  Filled 2012-09-05 (×4): qty 1

## 2012-09-05 MED ORDER — WARFARIN SODIUM 7.5 MG PO TABS
17.5000 mg | ORAL_TABLET | Freq: Once | ORAL | Status: AC
  Administered 2012-09-05: 17.5 mg via ORAL
  Filled 2012-09-05: qty 1

## 2012-09-05 NOTE — Evaluation (Signed)
Occupational Therapy Evaluation Patient Details Name: [name] MRN: [id number] DOB: Jun 24, 1980 Today's Date: 09/05/2012 Time: 4098-1191 OT Time Calculation (min): 56 min  OT Assessment / Plan / Recommendation Clinical Impression  This 32 yo male admitted with C2 cervical fracture and ORIF right UE and LE.  Vertebral artery dissection as well. NWB'ing right side all affecting pt's independence with BADLs. Will benefit from acute OT wtih follow up on CIR.    OT Assessment  Patient needs continued OT Services    Follow Up Recommendations  CIR    Barriers to Discharge None    Equipment Recommendations   (TBD)       Frequency  Min 2X/week    Precautions / Restrictions Precautions Precautions: Cervical;Fall Required Braces or Orthoses: Cervical Brace;Other Brace/Splint Cervical Brace: Hard collar Other Brace/Splint: Sling for transfers to help with NWB'ing RUE Restrictions Weight Bearing Restrictions: Yes RUE Weight Bearing: Non weight bearing RLE Weight Bearing: Non weight bearing Other Position/Activity Restrictions: Needs reinforcement of NWB RUE   Pertinent Vitals/Pain 10/10 RLE    ADL  Eating/Feeding: Performed;Set up Where Assessed - Eating/Feeding: Bed level Grooming: Set up;Supervision/safety;Simulated Where Assessed - Grooming: Unsupported sitting Upper Body Bathing: Simulated;Set up;Supervision/safety Where Assessed - Upper Body Bathing: Unsupported sitting Lower Body Bathing: Simulated;Maximal assistance Where Assessed - Lower Body Bathing: Supported sit to stand Upper Body Dressing: Simulated;Moderate assistance Where Assessed - Upper Body Dressing: Supported sitting Lower Body Dressing: Simulated;+1 Total assistance Where Assessed - Lower Body Dressing: Supported sit to Pharmacist, hospital: Mining engineer Method: Stand pivot (to pt's left) Acupuncturist:  (Bed>recliner>bed) Toileting - Architect  and Hygiene: Moderate assistance;Simulated Where Assessed - Toileting Clothing Manipulation and Hygiene: Standing Transfers/Ambulation Related to ADLs: Min A for sit to stand, stand to sit, stand pivot ADL Comments: Tried to get pt to try and cross his LLE over his RLE to see if he could get to his left sock; however pt stated that he was hurting too much to try    OT Diagnosis: Generalized weakness;Cognitive deficits;Acute pain  OT Problem List: Decreased strength;Decreased range of motion;Impaired balance (sitting and/or standing);Decreased cognition;Pain;Decreased knowledge of use of DME or AE;Impaired UE functional use;Decreased knowledge of precautions OT Treatment Interventions: Self-care/ADL training;Therapeutic activities;Patient/family education;Balance training;DME and/or AE instruction   OT Goals Acute Rehab OT Goals OT Goal Formulation: With patient Time For Goal Achievement: 09/19/12 Potential to Achieve Goals: Good ADL Goals Pt Will Perform Upper Body Bathing: with set-up;with supervision;Sit to stand in shower;Supported ADL Goal: Product manager - Progress: Goal set today Pt Will Perform Lower Body Bathing: with min assist;Sit to stand in shower;Supported;with adaptive equipment ADL Goal: Lower Body Bathing - Progress: Goal set today Pt Will Perform Upper Body Dressing: with set-up;with supervision;Unsupported;Sitting, chair;Sitting, bed ADL Goal: Upper Body Dressing - Progress: Goal set today Pt Will Perform Lower Body Dressing: with min assist;Supported;Sit to stand from bed;Sit to stand from chair;with adaptive equipment ADL Goal: Lower Body Dressing - Progress: Goal set today Pt Will Transfer to Toilet: 3-in-1;Maintaining weight bearing status (Min guard A) ADL Goal: Toilet Transfer - Progress: Goal set today Miscellaneous OT Goals Miscellaneous OT Goal #1: Pt will be S for OOB and in bed OT Goal: Miscellaneous Goal #1 - Progress: Goal set today  Visit Information   Last OT Received On: 09/05/12 Assistance Needed: +1       Prior Functioning     Home Living Lives With: Alone;Other (Comment) (roommate--Fred) Available Help at Discharge: Other (Comment) Type  of Home: Other (Comment) Home Access: Stairs to enter;Other (comment) Entrance Stairs-Number of Steps: flight Entrance Stairs-Rails: Right;Left Home Layout: Two level Alternate Level Stairs-Number of Steps: on second floor of hotel per pt Bathroom Shower/Tub: Tub/shower unit;Curtain Bathroom Toilet: Standard Home Adaptive Equipment: None Additional Comments: In home worker also took him to appts.  He has had this worker for 3 years.  Family lives in [place]. Prior Function Level of Independence: Independent Able to Take Stairs?: Yes Driving: No Vocation: On disability Communication Communication: No difficulties Dominant Hand: Right         Vision/Perception Vision - History Baseline Vision: No visual deficits Patient Visual Report: No change from baseline   Cognition  Cognition Arousal/Alertness: Awake/alert Behavior During Therapy: WFL for tasks assessed/performed Overall Cognitive Status: Impaired/Different from baseline (Pt has history of MR) Area of Impairment: Attention Current Attention Level: Selective    Extremity/Trunk Assessment Right Upper Extremity Assessment RUE ROM/Strength/Tone: Deficits (however pt is not to weight bear through it.) RUE ROM/Strength/Tone Deficits: Pt moving arm to tolerance; however pt is not to weight bear through it. Left Upper Extremity Assessment LUE ROM/Strength/Tone: Within functional levels     Mobility Bed Mobility Bed Mobility: Supine to Sit;Sitting - Scoot to [place] of Bed;Sit to Supine;Scooting to [facility] Supine to Sit: 3: Mod assist;HOB flat Sitting - Scoot to Edge of Bed: 3: Mod assist Sit to Supine: 4: Min assist Scooting to HOB: 1: +2 Total assist Scooting to [facility]: Patient Percentage: 10% Details for Bed Mobility  Assistance: Cues to not pull with RUE Transfers Transfers: Sit to Stand;Stand to Sit Sit to Stand: 4: Min assist;With upper extremity assist;From bed Stand to Sit: 4: Min assist;With upper extremity assist;With armrests;To chair/3-in-1 Details for Transfer Assistance: VCs to not put weight through RUE           End of Session OT - End of Session Activity Tolerance: Patient limited by pain Patient left: in bed;with call bell/phone within reach;with bed alarm set       Evette Georges [phone] 09/05/2012, 5:11 PM

## 2012-09-05 NOTE — Progress Notes (Signed)
Subjective: 15 Days Post-Op Procedure(s) (LRB): OPEN REDUCTION INTERNAL FIXATION (ORIF) RIGHT LATERAL TIBIAL PLATEAU (Right) OPEN REDUCTION INTERNAL FIXATION (ORIF) CLAVICULAR FRACTURE (Right) Patient reports pain as moderate.    Objective: Vital signs in last 24 hours: Temp:  [97.9 F (36.6 C)-98.8 F (37.1 C)] 98.1 F (36.7 C) (05/29 0639) Pulse Rate:  [75-78] 75 (05/29 0639) Resp:  [17-18] 18 (05/29 0639) BP: (122-127)/(72-76) 122/76 mmHg (05/29 0639) SpO2:  [98 %-100 %] 100 % (05/29 0639)  Intake/Output from previous day: 05/28 0701 - 05/29 0700 In: 1880.3 [P.O.:1640; I.V.:240.3] Out: 2200 [Urine:2200] Intake/Output this shift:     Recent Labs  09/03/12 0530 09/04/12 0445 09/05/12 0632  HGB 9.5* 10.1* 9.9*    Recent Labs  09/04/12 0445 09/05/12 0632  WBC 5.2 4.5  RBC 3.43* 3.48*  HCT 29.0* 29.9*  PLT 754* 786*    Recent Labs  09/03/12 0530 09/05/12 0632  NA 122* 123*  K 4.4 4.7  CL 88* 88*  CO2 23 24  BUN 9 11  CREATININE 0.58 0.64  GLUCOSE 103* 101*  CALCIUM 9.6 10.2    Recent Labs  09/04/12 0445 09/05/12 0632  INR 2.64* 3.20*    Neurologically intact  Assessment/Plan: 15 Days Post-Op Procedure(s) (LRB): OPEN REDUCTION INTERNAL FIXATION (ORIF) RIGHT LATERAL TIBIAL PLATEAU (Right) OPEN REDUCTION INTERNAL FIXATION (ORIF) CLAVICULAR FRACTURE (Right) Up with therapy to W/C.   No change in WB instructions at this time  [name] C 09/05/2012, 8:02 AM
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
## 2012-09-05 NOTE — Clinical Social Work Note (Signed)
Clinical Social Worker spoke with patient at bedside to discuss patient plans for discharge to [facility].  Patient initially was reluctant, however after making arrangements for his belongings, patient is now agreeable and very willing to go and participate in the program.  Patient family aware of discharge plans per CM.  CSW will continue to follow for support and to facilitate patient discharge needs in the morning.  Macario Golds, [place] [phone]

## 2012-09-05 NOTE — Progress Notes (Signed)
Patient ID: [name], male   DOB: 1980/05/10, 32 y.o.   MRN: [id number]   LOS: 18 days   Subjective: No new c/o.   Objective: Vital signs in last 24 hours: Temp:  [97.9 F (36.6 C)-98.8 F (37.1 C)] 98.1 F (36.7 C) (05/29 0639) Pulse Rate:  [75-78] 75 (05/29 0639) Resp:  [17-18] 18 (05/29 0639) BP: (122-127)/(72-76) 122/76 mmHg (05/29 0639) SpO2:  [98 %-100 %] 100 % (05/29 0639) Last BM Date: 09/02/12   Laboratory  CBC  Recent Labs  09/04/12 0445 09/05/12 0632  WBC 5.2 4.5  HGB 10.1* 9.9*  HCT 29.0* 29.9*  PLT 754* 786*   BMET  Recent Labs  09/03/12 0530 09/05/12 0632  NA 122* 123*  K 4.4 4.7  CL 88* 88*  CO2 23 24  GLUCOSE 103* 101*  BUN 9 11  CREATININE 0.58 0.64  CALCIUM 9.6 10.2   Lab Results  Component Value Date   INR 3.20* 09/05/2012   INR 2.64* 09/04/2012   INR 2.30* 09/03/2012   PTT: 68   Physical Exam General appearance: alert and no distress Resp: clear to auscultation bilaterally Cardio: regular rate and rhythm GI: normal findings: bowel sounds normal and soft, non-tender   Assessment/Plan: PHBC  Concussion  Scalp lac  C2 Fx - C-collar  R clavicle Fx - S/P ORIF  R tibial plateau Fx - S/P ORIF  L vertebral art dissection - Argatroban, coumadin  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  Multiple medical problems -- Home meds  FEN -- Increase Miralax VTE -- Argatroban, coumadin  Dispo - CIR at [facility] once coumadin therapeutic or possibly tomorrow if they can follow argatroban protocol.    Freeman Caldron, PA-C Pager: [phone] General Trauma PA Pager: [phone]   09/05/2012

## 2012-09-05 NOTE — Progress Notes (Signed)
Plan CIR at [facility], possibly tomorrow if they can follow the argatroban protocol. Patient examined and I agree with the assessment and plan  Violeta Gelinas, MD, MPH, FACS Pager: [phone]  09/05/2012 11:57 AM

## 2012-09-06 ENCOUNTER — Encounter: Payer: Self-pay | Admitting: Internal Medicine

## 2012-09-06 LAB — CBC
HCT: 28.3 % — ABNORMAL LOW (ref 39.0–52.0)
Hemoglobin: 9.6 g/dL — ABNORMAL LOW (ref 13.0–17.0)
MCV: 84.7 fL (ref 78.0–100.0)
Platelets: 769 10*3/uL — ABNORMAL HIGH (ref 150–400)
RBC: 3.34 MIL/uL — ABNORMAL LOW (ref 4.22–5.81)
WBC: 4.1 10*3/uL (ref 4.0–10.5)

## 2012-09-06 LAB — PROTIME-INR: INR: 2.38 — ABNORMAL HIGH (ref 0.00–1.49)

## 2012-09-06 MED ORDER — RITONAVIR 100 MG PO TABS: 100.0000 mg | ORAL_TABLET | Freq: Every day | ORAL | Status: DC

## 2012-09-06 MED ORDER — POLYETHYLENE GLYCOL 3350 17 G PO PACK: 17.0000 g | PACK | Freq: Two times a day (BID) | ORAL | Status: DC

## 2012-09-06 MED ORDER — EMTRICITABINE-TENOFOVIR DF 200-300 MG PO TABS: 1.0000 | ORAL_TABLET | Freq: Every day | ORAL | Status: DC

## 2012-09-06 MED ORDER — SODIUM CHLORIDE 1 G PO TABS: 2.0000 g | ORAL_TABLET | Freq: Three times a day (TID) | ORAL | Status: DC

## 2012-09-06 MED ORDER — TRAMADOL HCL 50 MG PO TABS: 100.0000 mg | ORAL_TABLET | Freq: Four times a day (QID) | ORAL | Status: DC

## 2012-09-06 MED ORDER — METHOCARBAMOL 500 MG PO TABS: 500.0000 mg | ORAL_TABLET | Freq: Four times a day (QID) | ORAL | Status: DC | PRN

## 2012-09-06 MED ORDER — ALPRAZOLAM 0.5 MG PO TABS: 0.5000 mg | ORAL_TABLET | Freq: Three times a day (TID) | ORAL | Status: DC | PRN

## 2012-09-06 MED ORDER — DARUNAVIR ETHANOLATE 800 MG PO TABS: 800.0000 mg | ORAL_TABLET | Freq: Every day | ORAL | Status: DC

## 2012-09-06 MED ORDER — ARGATROBAN 50 MG/50ML IV SOLN: 0.2250 ug/kg/min | INTRAVENOUS | Status: DC

## 2012-09-06 MED ORDER — DSS 100 MG PO CAPS: 200.0000 mg | ORAL_CAPSULE | Freq: Two times a day (BID) | ORAL | Status: DC

## 2012-09-06 MED ORDER — BISACODYL 5 MG PO TBEC: 10.0000 mg | DELAYED_RELEASE_TABLET | Freq: Every day | ORAL | Status: DC

## 2012-09-06 MED ORDER — WARFARIN SODIUM 10 MG PO TABS: ORAL_TABLET | ORAL | Status: DC

## 2012-09-06 MED ORDER — OXYCODONE HCL 10 MG PO TABS: 10.0000 mg | ORAL_TABLET | ORAL | Status: DC | PRN

## 2012-09-06 NOTE — Progress Notes (Signed)
Report called over to Inpatient Rehab at [facility], RN Dannielle Huh took report. All questions answered.  Will continue to monitor and wait for transport. Patient resting comfortably.

## 2012-09-06 NOTE — Clinical Social Work Note (Signed)
Clinical Social Worker continuing to follow patient for support and discharge planning needs.  CSW and CM spoke with patient at bedside who remains agreeable with transfer to [facility].  [facility] here to transport patient.  CSW to notify patient aunt of transfer today.  Clinical Social Worker facilitated patient discharge including contacting patient family and facility to confirm patient discharge plans.  Clinical information faxed to facility and family agreeable with plan.  CM arranged ambulance transport via [facility] to [facility] .  RN to call report prior to discharge.  Clinical Social Worker will sign off for now as social work intervention is no longer needed. Please consult [place] again if new need arises.  Macario Golds, [place] [phone]

## 2012-09-06 NOTE — Discharge Summary (Signed)
Okay to go to Rehab at [facility]  This patient has been seen and I agree with the findings and treatment plan.  Marta Lamas. Gae Bon, MD, FACS [phone] (pager) [phone] (direct pager) Trauma Surgeon
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
## 2012-09-06 NOTE — Progress Notes (Signed)
Patient ID: [name], male   DOB: [birth year], 32 y.o.   MRN: [id number]   LOS: 19 days   Subjective: No new c/o.   Objective: Vital signs in last 24 hours: Temp:  [98.1 F (36.7 C)-98.6 F (37 C)] 98.2 F (36.8 C) (05/30 0614) Pulse Rate:  [69-80] 69 (05/30 0614) Resp:  [16-18] 16 (05/30 0614) BP: (121-125)/(60-70) 125/70 mmHg (05/30 0614) SpO2:  [100 %] 100 % (05/30 0614) Last BM Date: 09/05/12   Laboratory  CBC  Recent Labs  09/05/12 0632 09/06/12 0536  WBC 4.5 4.1  HGB 9.9* 9.6*  HCT 29.9* 28.3*  PLT 786* 769*   Lab Results  Component Value Date   INR 2.38* 09/06/2012   INR 3.20* 09/05/2012   INR 2.64* 09/04/2012   PTT: 65   Physical Exam General appearance: alert and no distress Resp: clear to auscultation bilaterally Cardio: regular rate and rhythm GI: normal findings: bowel sounds normal and soft, non-tender   Assessment/Plan: PHBC  Concussion  Scalp lac  C2 Fx - C-collar  R clavicle Fx - S/P ORIF  R tibial plateau Fx - S/P ORIF  L vertebral art dissection - Argatroban, coumadin  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  Multiple medical problems -- Home meds  Dispo - CIR at [facility] today    Freeman Caldron, PA-C Pager: [phone] General Trauma PA Pager: [phone]   09/06/2012

## 2012-09-09 NOTE — Progress Notes (Signed)
Agree with PTA on change in frequency due to D/C disposition. [place], [place], [place] [phone]

## 2012-09-26 ENCOUNTER — Ambulatory Visit (INDEPENDENT_AMBULATORY_CARE_PROVIDER_SITE_OTHER): Payer: Medicaid Other | Admitting: Internal Medicine

## 2012-09-26 ENCOUNTER — Encounter: Payer: Self-pay | Admitting: Internal Medicine

## 2012-09-26 VITALS — BP 145/91 | HR 90 | Temp 98.2°F | Ht 68.0 in | Wt 138.0 lb

## 2012-09-26 DIAGNOSIS — I7774 Dissection of vertebral artery: Secondary | ICD-10-CM

## 2012-09-26 DIAGNOSIS — S82141H Displaced bicondylar fracture of right tibia, subsequent encounter for open fracture type I or II with delayed healing: Secondary | ICD-10-CM

## 2012-09-26 DIAGNOSIS — B20 Human immunodeficiency virus [HIV] disease: Secondary | ICD-10-CM

## 2012-09-26 DIAGNOSIS — S12100D Unspecified displaced fracture of second cervical vertebra, subsequent encounter for fracture with routine healing: Secondary | ICD-10-CM

## 2012-09-26 DIAGNOSIS — IMO0002 Reserved for concepts with insufficient information to code with codable children: Secondary | ICD-10-CM

## 2012-09-26 DIAGNOSIS — S8290XD Unspecified fracture of unspecified lower leg, subsequent encounter for closed fracture with routine healing: Secondary | ICD-10-CM

## 2012-09-26 NOTE — Assessment & Plan Note (Signed)
He will be returning to neurosurgery for followup and is aware of his appointment

## 2012-09-26 NOTE — Assessment & Plan Note (Signed)
He does have an appointment with orthopedics and is aware of that and is going to go

## 2012-09-26 NOTE — Assessment & Plan Note (Signed)
He tells me he has been compliant with his antiretroviral therapy and therefore we'll check his labs today sure he has had no problems with resistance. He has a followup appointment in about one month and I will see him again then.

## 2012-09-26 NOTE — Assessment & Plan Note (Signed)
He does have a dissection with thrombus and is on Coumadin after not tolerating heparin. He will need a followup scan in about a month to ideally would be able to get an arteriogram again to see if it has resolved. I am going to send him to primary doctor to see if they can help with management of this and the Coumadin. He is requiring over 20 mg of Coumadin a day. I suspect a three-month course would be sufficient though again this would depend on a rescan if we are able to do an arteriogram or at least an MRA. I will check his INR today.
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
## 2012-09-30 ENCOUNTER — Ambulatory Visit: Payer: Self-pay | Admitting: Internal Medicine

## 2012-09-30 ENCOUNTER — Ambulatory Visit: Payer: Self-pay

## 2012-10-14 ENCOUNTER — Other Ambulatory Visit: Payer: Self-pay | Admitting: Neurosurgery

## 2012-10-14 ENCOUNTER — Other Ambulatory Visit: Payer: Medicaid Other

## 2012-10-14 DIAGNOSIS — B20 Human immunodeficiency virus [HIV] disease: Secondary | ICD-10-CM

## 2012-10-14 DIAGNOSIS — Z79899 Other long term (current) drug therapy: Secondary | ICD-10-CM

## 2012-10-14 DIAGNOSIS — Z113 Encounter for screening for infections with a predominantly sexual mode of transmission: Secondary | ICD-10-CM

## 2012-10-14 DIAGNOSIS — S12100S Unspecified displaced fracture of second cervical vertebra, sequela: Secondary | ICD-10-CM

## 2012-10-14 LAB — CBC WITH DIFFERENTIAL/PLATELET
Eosinophils Absolute: 0.1 10*3/uL (ref 0.0–0.7)
Eosinophils Relative: 4 % (ref 0–5)
HCT: 41.4 % (ref 39.0–52.0)
Hemoglobin: 13.4 g/dL (ref 13.0–17.0)
Lymphs Abs: 1.9 10*3/uL (ref 0.7–4.0)
MCH: 28.3 pg (ref 26.0–34.0)
MCV: 87.3 fL (ref 78.0–100.0)
Monocytes Absolute: 0.2 10*3/uL (ref 0.1–1.0)
Monocytes Relative: 6 % (ref 3–12)
RBC: 4.74 MIL/uL (ref 4.22–5.81)

## 2012-10-15 LAB — COMPLETE METABOLIC PANEL WITH GFR
Albumin: 5.1 g/dL (ref 3.5–5.2)
Alkaline Phosphatase: 79 U/L (ref 39–117)
BUN: 15 mg/dL (ref 6–23)
GFR, Est Non African American: >89 mL/min
Glucose, Bld: 85 mg/dL (ref 70–99)
Potassium: 4.7 mEq/L (ref 3.5–5.3)

## 2012-10-15 LAB — LIPID PANEL
Cholesterol: 189 mg/dL (ref 0–200)
Total CHOL/HDL Ratio: 4 Ratio

## 2012-10-15 LAB — RPR

## 2012-10-21 ENCOUNTER — Ambulatory Visit
Admission: RE | Admit: 2012-10-21 | Discharge: 2012-10-21 | Disposition: A | Discharge status: Home / Self Care | Payer: Medicaid Other | Source: Ambulatory Visit | Attending: Neurosurgery | Admitting: Neurosurgery

## 2012-10-21 DIAGNOSIS — S12100S Unspecified displaced fracture of second cervical vertebra, sequela: Secondary | ICD-10-CM

## 2012-10-28 ENCOUNTER — Ambulatory Visit (INDEPENDENT_AMBULATORY_CARE_PROVIDER_SITE_OTHER): Payer: Medicaid Other | Admitting: Internal Medicine

## 2012-10-28 ENCOUNTER — Encounter: Payer: Self-pay | Admitting: Internal Medicine

## 2012-10-28 VITALS — BP 136/92 | HR 82 | Temp 98.5°F | Ht 67.5 in | Wt 136.0 lb

## 2012-10-28 DIAGNOSIS — I7774 Dissection of vertebral artery: Secondary | ICD-10-CM

## 2012-10-28 DIAGNOSIS — B20 Human immunodeficiency virus [HIV] disease: Secondary | ICD-10-CM

## 2012-10-28 NOTE — Assessment & Plan Note (Signed)
He is doing well with his HIV. We will follow up in about 3 months for routine followup.
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
## 2012-10-28 NOTE — Assessment & Plan Note (Signed)
I am going to have him referred to neurology to see if anticoagulation is indeed recommended for him for the vertebral artery dissection with a clot. He did stop Coumadin on his own but was supposed to continue that.

## 2012-10-31 ENCOUNTER — Other Ambulatory Visit: Payer: Self-pay | Admitting: *Deleted

## 2012-10-31 DIAGNOSIS — I7774 Dissection of vertebral artery: Secondary | ICD-10-CM

## 2012-11-18 ENCOUNTER — Ambulatory Visit: Payer: Self-pay | Admitting: Neurology

## 2012-11-18 ENCOUNTER — Telehealth: Payer: Self-pay | Admitting: Neurology

## 2012-11-18 NOTE — Telephone Encounter (Signed)
Patient no showed neurology appt scheduled with Dr. Shon Millet @ [facility] on 11/18/12 / Oneita Kras

## 2013-01-27 ENCOUNTER — Other Ambulatory Visit: Payer: Medicaid Other

## 2013-01-27 DIAGNOSIS — B20 Human immunodeficiency virus [HIV] disease: Secondary | ICD-10-CM

## 2013-01-28 LAB — T-HELPER CELL (CD4) - (RCID CLINIC ONLY): CD4 % Helper T Cell: 36 % (ref 33–55)

## 2013-01-31 ENCOUNTER — Other Ambulatory Visit: Payer: Self-pay | Admitting: Internal Medicine

## 2013-01-31 DIAGNOSIS — I10 Essential (primary) hypertension: Secondary | ICD-10-CM

## 2013-01-31 DIAGNOSIS — B20 Human immunodeficiency virus [HIV] disease: Secondary | ICD-10-CM

## 2013-02-13 ENCOUNTER — Ambulatory Visit (INDEPENDENT_AMBULATORY_CARE_PROVIDER_SITE_OTHER): Payer: Medicaid Other | Admitting: Internal Medicine

## 2013-02-13 ENCOUNTER — Encounter: Payer: Self-pay | Admitting: Internal Medicine

## 2013-02-13 VITALS — BP 172/110 | HR 83 | Temp 98.2°F | Ht 68.0 in | Wt 137.0 lb

## 2013-02-13 DIAGNOSIS — Z23 Encounter for immunization: Secondary | ICD-10-CM

## 2013-02-13 DIAGNOSIS — I7774 Dissection of vertebral artery: Secondary | ICD-10-CM

## 2013-02-13 DIAGNOSIS — B20 Human immunodeficiency virus [HIV] disease: Secondary | ICD-10-CM

## 2013-02-13 DIAGNOSIS — I10 Essential (primary) hypertension: Secondary | ICD-10-CM

## 2013-02-13 MED ORDER — AMLODIPINE BESYLATE 10 MG PO TABS: 10.0000 mg | ORAL_TABLET | Freq: Every day | ORAL | Status: DC

## 2013-02-14 NOTE — Assessment & Plan Note (Signed)
He did not follow up on this and is not interested in continuing coumadin.

## 2013-02-14 NOTE — Progress Notes (Signed)
  Subjective:    Patient ID: [name], male    DOB: 02-11-1981, 32 y.o.   MRN: [id number]  HPI  For routine followup of his HIV.  He continues on Prezista, Norvir and Truvada.  He denies any missed doses. His CD4 count is 740 with an undetectable viral load. He takes his medication daily.  He stopped taking Coumadin for his vertebral artery dissection with a clot and was sent to neurology for evaluation but did not go to appt. He has established with a primary care physician.  No weight loss   Review of Systems  Constitutional: Negative for appetite change and fatigue.  HENT: Negative for sore throat and trouble swallowing.   Eyes: Negative for visual disturbance.  Respiratory: Negative for shortness of breath.   Cardiovascular: Negative for leg swelling.  Gastrointestinal: Negative for nausea, abdominal pain and diarrhea.  Musculoskeletal: Negative for arthralgias and myalgias.  Skin: Negative for rash.  Neurological: Negative for dizziness, light-headedness and headaches.  Hematological: Negative for adenopathy.  Psychiatric/Behavioral: Negative for dysphoric mood.       Objective:   Physical Exam  Constitutional: He is oriented to person, place, and time. He appears well-developed and well-nourished. No distress.  HENT:  Mouth/Throat: No oropharyngeal exudate.  Eyes: Right eye exhibits no discharge. Left eye exhibits no discharge. No scleral icterus.  Neck:  No Neck brace in place  Cardiovascular: Normal rate, regular rhythm and normal heart sounds.   No murmur heard. Pulmonary/Chest: Effort normal and breath sounds normal. No respiratory distress. He has no wheezes.  Neurological: He is alert and oriented to person, place, and time.  Skin: Skin is warm and dry. No rash noted.  Psychiatric: He has a normal mood and affect. His behavior is normal.          Assessment & Plan:

## 2013-02-14 NOTE — Assessment & Plan Note (Signed)
Doing well on his current regimen.  Will continue.  RTC in about 3-4 months

## 2013-03-10 ENCOUNTER — Ambulatory Visit: Payer: Self-pay | Admitting: Internal Medicine

## 2013-03-10 ENCOUNTER — Telehealth: Payer: Self-pay | Admitting: *Deleted

## 2013-03-10 NOTE — Telephone Encounter (Signed)
Patient not available to answer the phone.

## 2013-05-10 ENCOUNTER — Encounter (HOSPITAL_COMMUNITY): Payer: Self-pay | Admitting: Emergency Medicine

## 2013-05-10 ENCOUNTER — Emergency Department (HOSPITAL_COMMUNITY)
Admission: EM | Admit: 2013-05-10 | Discharge: 2013-05-11 | Disposition: A | Discharge status: Other Acute Inpatient Hospital | Payer: Medicaid Other | Attending: Emergency Medicine | Admitting: Emergency Medicine

## 2013-05-10 DIAGNOSIS — F1010 Alcohol abuse, uncomplicated: Secondary | ICD-10-CM | POA: Insufficient documentation

## 2013-05-10 DIAGNOSIS — F3289 Other specified depressive episodes: Secondary | ICD-10-CM | POA: Insufficient documentation

## 2013-05-10 DIAGNOSIS — Z9151 Personal history of suicidal behavior: Secondary | ICD-10-CM

## 2013-05-10 DIAGNOSIS — F329 Major depressive disorder, single episode, unspecified: Secondary | ICD-10-CM | POA: Insufficient documentation

## 2013-05-10 DIAGNOSIS — Z79899 Other long term (current) drug therapy: Secondary | ICD-10-CM | POA: Insufficient documentation

## 2013-05-10 DIAGNOSIS — F339 Major depressive disorder, recurrent, unspecified: Secondary | ICD-10-CM

## 2013-05-10 DIAGNOSIS — F1410 Cocaine abuse, uncomplicated: Secondary | ICD-10-CM | POA: Insufficient documentation

## 2013-05-10 DIAGNOSIS — F17200 Nicotine dependence, unspecified, uncomplicated: Secondary | ICD-10-CM | POA: Insufficient documentation

## 2013-05-10 DIAGNOSIS — F411 Generalized anxiety disorder: Secondary | ICD-10-CM | POA: Insufficient documentation

## 2013-05-10 DIAGNOSIS — I10 Essential (primary) hypertension: Secondary | ICD-10-CM

## 2013-05-10 DIAGNOSIS — B20 Human immunodeficiency virus [HIV] disease: Secondary | ICD-10-CM

## 2013-05-10 DIAGNOSIS — R4589 Other symptoms and signs involving emotional state: Secondary | ICD-10-CM

## 2013-05-10 DIAGNOSIS — R45851 Suicidal ideations: Secondary | ICD-10-CM | POA: Insufficient documentation

## 2013-05-10 DIAGNOSIS — F70 Mild intellectual disabilities: Secondary | ICD-10-CM | POA: Diagnosis present

## 2013-05-10 DIAGNOSIS — R4689 Other symptoms and signs involving appearance and behavior: Secondary | ICD-10-CM

## 2013-05-10 DIAGNOSIS — F1920 Other psychoactive substance dependence, uncomplicated: Secondary | ICD-10-CM | POA: Diagnosis present

## 2013-05-10 DIAGNOSIS — Z21 Asymptomatic human immunodeficiency virus [HIV] infection status: Secondary | ICD-10-CM | POA: Insufficient documentation

## 2013-05-10 DIAGNOSIS — Z915 Personal history of self-harm: Secondary | ICD-10-CM

## 2013-05-10 LAB — COMPREHENSIVE METABOLIC PANEL
ALBUMIN: 5 g/dL (ref 3.5–5.2)
ALK PHOS: 84 U/L (ref 39–117)
ALT: 25 U/L (ref 0–53)
AST: 25 U/L (ref 0–37)
BUN: 27 mg/dL — ABNORMAL HIGH (ref 6–23)
CALCIUM: 10.1 mg/dL (ref 8.4–10.5)
CO2: 22 mEq/L (ref 19–32)
Chloride: 94 mEq/L — ABNORMAL LOW (ref 96–112)
Creatinine, Ser: 1.16 mg/dL (ref 0.50–1.35)
GFR calc non Af Amer: 81 mL/min — ABNORMAL LOW (ref >90)
Glucose, Bld: 94 mg/dL (ref 70–99)
POTASSIUM: 3.8 meq/L (ref 3.7–5.3)
SODIUM: 134 meq/L — AB (ref 137–147)
TOTAL PROTEIN: 8.9 g/dL — AB (ref 6.0–8.3)
Total Bilirubin: 0.3 mg/dL (ref 0.3–1.2)

## 2013-05-10 LAB — SALICYLATE LEVEL: Salicylate Lvl: <2 mg/dL — ABNORMAL LOW (ref 2.8–20.0)

## 2013-05-10 LAB — RAPID URINE DRUG SCREEN, HOSP PERFORMED
Amphetamines: NOT DETECTED
BARBITURATES: NOT DETECTED
BENZODIAZEPINES: NOT DETECTED
COCAINE: POSITIVE — AB
OPIATES: NOT DETECTED
Tetrahydrocannabinol: NOT DETECTED

## 2013-05-10 LAB — CBC
HCT: 43.7 % (ref 39.0–52.0)
Hemoglobin: 15.1 g/dL (ref 13.0–17.0)
MCH: 31.7 pg (ref 26.0–34.0)
MCHC: 34.6 g/dL (ref 30.0–36.0)
MCV: 91.6 fL (ref 78.0–100.0)
Platelets: 346 10*3/uL (ref 150–400)
RBC: 4.77 MIL/uL (ref 4.22–5.81)
RDW: 11.7 % (ref 11.5–15.5)
WBC: 8.3 10*3/uL (ref 4.0–10.5)

## 2013-05-10 LAB — ACETAMINOPHEN LEVEL

## 2013-05-10 LAB — ETHANOL: Alcohol, Ethyl (B): <11 mg/dL (ref 0–11)

## 2013-05-10 MED ORDER — AMLODIPINE BESYLATE 10 MG PO TABS
10.0000 mg | ORAL_TABLET | Freq: Every day | ORAL | Status: DC
Start: 2013-05-11 — End: 2013-05-10

## 2013-05-10 MED ORDER — ALUM & MAG HYDROXIDE-SIMETH 200-200-20 MG/5ML PO SUSP: 30.0000 mL | ORAL | Status: DC | PRN

## 2013-05-10 MED ORDER — LORAZEPAM 1 MG PO TABS
1.0000 mg | ORAL_TABLET | Freq: Three times a day (TID) | ORAL | Status: DC | PRN
  Administered 2013-05-10: 1 mg via ORAL
  Filled 2013-05-10: qty 1

## 2013-05-10 MED ORDER — TRAZODONE HCL 50 MG PO TABS
50.0000 mg | ORAL_TABLET | Freq: Every evening | ORAL | Status: DC | PRN
  Administered 2013-05-10: 50 mg via ORAL
  Filled 2013-05-10: qty 1

## 2013-05-10 MED ORDER — IBUPROFEN 200 MG PO TABS: 600.0000 mg | ORAL_TABLET | Freq: Three times a day (TID) | ORAL | Status: DC | PRN

## 2013-05-10 MED ORDER — NICOTINE 21 MG/24HR TD PT24
21.0000 mg | MEDICATED_PATCH | Freq: Every day | TRANSDERMAL | Status: DC
  Administered 2013-05-10 – 2013-05-11 (×2): 21 mg via TRANSDERMAL
  Filled 2013-05-10 (×2): qty 1

## 2013-05-10 MED ORDER — ZOLPIDEM TARTRATE 5 MG PO TABS: 5.0000 mg | ORAL_TABLET | Freq: Every evening | ORAL | Status: DC | PRN

## 2013-05-10 MED ORDER — ONDANSETRON HCL 4 MG PO TABS: 4.0000 mg | ORAL_TABLET | Freq: Three times a day (TID) | ORAL | Status: DC | PRN

## 2013-05-10 MED ORDER — RITONAVIR 100 MG PO CAPS
100.0000 mg | ORAL_CAPSULE | Freq: Every day | ORAL | Status: DC
  Administered 2013-05-11: 100 mg via ORAL
  Filled 2013-05-10 (×2): qty 1

## 2013-05-10 MED ORDER — EMTRICITABINE-TENOFOVIR DF 200-300 MG PO TABS
1.0000 | ORAL_TABLET | Freq: Every morning | ORAL | Status: DC
  Administered 2013-05-11: 1 via ORAL
  Filled 2013-05-10: qty 1

## 2013-05-10 MED ORDER — PROPRANOLOL HCL ER 60 MG PO CP24
60.0000 mg | ORAL_CAPSULE | Freq: Every day | ORAL | Status: DC
  Administered 2013-05-10 – 2013-05-11 (×2): 60 mg via ORAL
  Filled 2013-05-10 (×2): qty 1

## 2013-05-10 MED ORDER — AMLODIPINE BESYLATE 10 MG PO TABS
10.0000 mg | ORAL_TABLET | Freq: Every morning | ORAL | Status: DC
  Administered 2013-05-11: 10 mg via ORAL
  Filled 2013-05-10: qty 1

## 2013-05-10 MED ORDER — DARUNAVIR ETHANOLATE 800 MG PO TABS
800.0000 mg | ORAL_TABLET | Freq: Every day | ORAL | Status: DC
  Administered 2013-05-11: 800 mg via ORAL
  Filled 2013-05-10 (×2): qty 1
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
## 2013-05-10 NOTE — BH Assessment (Signed)
Assessment Note  [name] is an 33 y.o. male presenting to [facility] for suicidal ideation. [name] reported that he had a knife to his throat with thoughts of ending his life but he called a friend who sought help for him. [name] reported that he has been dealing with a lot of issues over the past several weeks such as issues with his health and substance use problems. [name] reported that he has a HIV diagnosis and he has been battling with his substance use issues. [name] reported being compliant with his medication except for yesterday when he was using cocaine.  [name] has a history of suicidal gestures and was hospitalized at [facility] in the past for swallowing glass. [name] was unable to recall the dates of his previous hospitalization; however he stated that it was approximately 16-17 years ago. [name] also reported being hospitalized at [facility] in 2009 for detox.   [name] lives alone; however he has friends and family in [place], [place]. [name] has two daughters. [name] was alert and oriented x3. [name] denies any current SI/HI at the present time; however he was admitted to the ER for thoughts to harm himself. [name] denies any auditory or visual hallucinations. [name] denies a history of physical or sexual abuse.  [name] denies a family history of mental illness but he had a cousin that committed suicide. [name] reported that his mother had a history of substance abuse. [name] reported that he was depressed. [name] reported that he drinks alcohol daily and will consume approximately 10-40 oz or 5- 12 packs daily. He also reported using up to $800 worth of cocaine. [name] reported that his appetite and sleep cycle has been poor. [name] reported that he sleeps approximately 4-6 hours a night and when he is using drugs he is not focused on eating much. [name] reported that he has a learning disability and stated that he has always been a "slow learner". [name] is unemployed and  currently receives SSI benefits.  [name] has expressed a desire to be placed in an inpatient program to help with his substance abuse issues.  Axis I: Major Depression, Recurrent severe and Major Depression, single episode Axis II: Deferred Axis III:  Past Medical History  Diagnosis Date  . Psychiatric problem   . Hx of suicide attempt 07/28/2011    Age 33.  Pt stated he wanted to kill himself and he was admitted to a mental health facility for a week or so.   [facility]. HIV infection   . Hypertension   . HIV (human immunodeficiency virus infection)    Axis IV: problems with primary support group Axis V: 11-20 some danger of hurting self or others possible OR occasionally fails to maintain minimal personal hygiene OR gross impairment in communication  Past Medical History:  Past Medical History  Diagnosis Date  . Psychiatric problem   . Hx of suicide attempt 07/28/2011    Age 33.  Pt stated he wanted to kill himself and he was admitted to a mental health facility for a week or so.   [facility] Kitchen. HIV infection   . Hypertension   . HIV (human immunodeficiency virus infection)     Past Surgical History  Procedure Laterality Date  . Orif tibia plateau Right 08/21/2012    Procedure: OPEN REDUCTION INTERNAL FIXATION (ORIF) RIGHT LATERAL TIBIAL PLATEAU;  Surgeon: Eldred MangesMark C Yates, MD;  Location: [facility] OR;  Service: Orthopedics;  Laterality: Right;  . Orif clavicular fracture Right 08/21/2012    Procedure:  OPEN REDUCTION INTERNAL FIXATION (ORIF) CLAVICULAR FRACTURE;  Surgeon: Eldred Manges, MD;  Location: [facility] OR;  Service: Orthopedics;  Laterality: Right;    Family History:  Family History  Problem Relation Age of Onset  . Hypertension Mother   . Hypertension Father     Social History:  reports that he has been smoking Cigarettes.  He has a 5.4 pack-year smoking history. He has never used smokeless tobacco. He reports that he drinks about 4.8 ounces of alcohol per week. He reports that he uses illicit drugs  (Marijuana) about 3 times per week.  Additional Social History:  Alcohol / Drug Use Pain Medications: Denies abuse Prescriptions: Denies abuse Over the Counter: Deniees abuse History of alcohol / drug use?: Yes (Pt reported using meth once. ) Longest period of sobriety (when/how long): 10 days  Negative Consequences of Use: Financial Withdrawal Symptoms: Fever / Chills Substance #1 Name of Substance 1: Cocaine  1 - Age of First Use: 31 1 - Amount (size/oz): up to $800 worth  1 - Frequency: daily  1 - Duration: 1 year 1 - Last Use / Amount: $150 worth  Substance #2 Name of Substance 2: Alcohol  2 - Amount (size/oz): 10-40oz or 5-12 pks  2 - Frequency: daily  CIWA: CIWA-Ar BP: 153/91 mmHg Pulse Rate: 98 COWS:    Allergies:  Allergies  Allergen Reactions  . Heparin Other (See Comments)    HIT antibody and SRA POSITIVE 5/20    Home Medications:  (Not in a hospital admission)  OB/GYN Status:  No LMP for male patient.  General Assessment Data Location of Assessment: [facility] ED Is this a Tele or Face-to-Face Assessment?: Face-to-Face Is this an Initial Assessment or a Re-assessment for this encounter?: Initial Assessment Living Arrangements: Alone Can pt return to current living arrangement?: Yes Admission Status: Voluntary Is patient capable of signing voluntary admission?: Yes     [facility] Crisis Care Plan Living Arrangements: Alone Name of Psychiatrist: unknown Name of Therapist: unknown   Education Status Is patient currently in school?: No  Risk to self Suicidal Ideation: Yes-Currently Present Suicidal Intent: Yes-Currently Present Is patient at risk for suicide?: Yes Suicidal Plan?: Yes-Currently Present Specify Current Suicidal Plan: Slit throat with knife  Access to Means: Yes Specify Access to Suicidal Means: Pt has knives in his home.  What has been your use of drugs/alcohol within the last 12 months?: daily  (reported using up to $800 worth of cocaine.  10-40 oz.) Previous Attempts/Gestures: Yes How many times?: 1 Other Self Harm Risks: no Triggers for Past Attempts: Unknown Intentional Self Injurious Behavior: None Family Suicide History: Yes (Cousin) Recent stressful life event(s): Other (Comment) (HIV diagnosis) Persecutory voices/beliefs?: No Depression: Yes Depression Symptoms: Insomnia;Loss of interest in usual pleasures;Feeling worthless/self pity Substance abuse history and/or treatment for substance abuse?: Yes  Risk to Others Homicidal Ideation: No Thoughts of Harm to Others: No Current Homicidal Intent: No Current Homicidal Plan: No Access to Homicidal Means: No History of harm to others?: No Assessment of Violence: None Noted Criminal Charges Pending?: No  Psychosis Hallucinations: None noted Delusions: None noted  Mental Status Report Appear/Hygiene: Other (Comment) (Appropriate to situation) Eye Contact: Good Motor Activity: Unremarkable Speech: Logical/coherent Level of Consciousness: Alert Mood: Depressed Affect: Depressed Anxiety Level: Minimal Thought Processes: Coherent Judgement: Unimpaired Orientation: Person;Place;Situation Obsessive Compulsive Thoughts/Behaviors: None  Cognitive Functioning Concentration: Normal Memory: Recent Intact IQ: Average Insight: Good Impulse Control: Good Appetite: Poor Weight Loss: 0 Weight Gain: 0 Sleep: Decreased Total Hours  of Sleep: 4 Vegetative Symptoms: None  ADLScreening [facility] Assessment Services) Patient's cognitive ability adequate to safely complete daily activities?: Yes Patient able to express need for assistance with ADLs?: Yes Independently performs ADLs?: Yes (appropriate for developmental age)  Prior Inpatient Therapy Prior Inpatient Therapy: Yes Prior Therapy Dates: unknown Prior Therapy Facilty[facility]Provider([facility]): [facility] Reason for Treatment: Reported that he swallowed glass  Prior Outpatient Therapy Prior Outpatient Therapy: No  ADL  Screening (condition at time of admission) Patient's cognitive ability adequate to safely complete daily activities?: Yes Patient able to express need for assistance with ADLs?: Yes Independently performs ADLs?: Yes (appropriate for developmental age)       Abuse/Neglect Assessment (Assessment to be complete while patient is alone) Physical Abuse: Denies Verbal Abuse: Denies Sexual Abuse: Denies Exploitation of patient/patient's resources: Denies Self-Neglect: Denies Values / Beliefs Cultural Requests During Hospitalization: None Spiritual Requests During Hospitalization: None Consults Spiritual Care Consult Needed: No Social Work Consult Needed: No Merchant navy officer (For Healthcare) Advance Directive: Patient does not have advance directive    Additional Information 1:1 In Past 12 Months?: No CIRT Risk: No Elopement Risk: No     Disposition: Consulted with Maryjean Morn, PA who agrees that pt meets criteria for inpatient treatment. Maryjean Morn, [place] accepted pt to [facility] Room 502 bed 2. Notified Dr. Rubin Payor of admission. Disposition Initial Assessment Completed for this Encounter: Yes Disposition of Patient: Inpatient treatment program  On Site Evaluation by:   Reviewed with Physician:    Lahoma Rocker 05/10/2013 11:45 PM

## 2013-05-10 NOTE — BH Assessment (Signed)
Received call for assessment. Spoke with Gary Gourdobyn Patricia, PA-C who said Pt is depressed due to HIV diagnosis and has been abusing cocaine and alcohol. He is suicidal with plan to cut throat.  Harlin RainFord Ellis Ria CommentWarrick Jr, LPC, [facility] Triage Specialist

## 2013-05-10 NOTE — ED Provider Notes (Signed)
CSN: [id number]     Arrival date & time 05/10/13  2100 History   First MD Initiated Contact with Patient 05/10/13 2104     Chief Complaint  Patient presents with  . Medical Clearance   (Consider location/radiation/quality/duration/timing/severity/associated sxs/prior Treatment) HPI Comments: Patient is a 33 year old male with a past medical history of HIV, hypertension, depression and a suicide attempt who presents to the emergency department voluntarily via [employer] with suicidal ideations. Patient states he has a plan to use a knife and cut his throat. States he has been diagnosed with HIV recently causing increased depression and suicidal ideations over the past 2 weeks. Denies homicidal ideations. Also admits to drug and alcohol use. States he has been using cocaine for the past 2 years. Today he drank two 40 ounce beers. Yesterday he used $800 worth of cocaine, today he used another $100 worth of cocaine.  The history is provided by the patient and the police.    Past Medical History  Diagnosis Date  . Psychiatric problem   . Hx of suicide attempt 07/28/2011    Age 33.  Pt stated he wanted to kill himself and he was admitted to a mental health facility for a week or so.   [facility]. HIV infection   . Hypertension   . HIV (human immunodeficiency virus infection)    Past Surgical History  Procedure Laterality Date  . Orif tibia plateau Right 08/21/2012    Procedure: OPEN REDUCTION INTERNAL FIXATION (ORIF) RIGHT LATERAL TIBIAL PLATEAU;  Surgeon: Eldred MangesMark C Yates, MD;  Location: [facility] OR;  Service: Orthopedics;  Laterality: Right;  . Orif clavicular fracture Right 08/21/2012    Procedure: OPEN REDUCTION INTERNAL FIXATION (ORIF) CLAVICULAR FRACTURE;  Surgeon: Eldred MangesMark C Yates, MD;  Location: [facility] OR;  Service: Orthopedics;  Laterality: Right;   Family History  Problem Relation Age of Onset  . Hypertension Mother   . Hypertension Father    History  Substance Use Topics  . Smoking status: Current Every Day  Smoker -- 0.30 packs/day for 18 years    Types: Cigarettes  . Smokeless tobacco: Never Used     Comment: encouraged to quit to help bp as well  . Alcohol Use: 4.8 oz/week    8 Cans of beer per week     Comment: 7 - 20 oz beers/week    Review of Systems  Psychiatric/Behavioral: Positive for suicidal ideas and dysphoric mood. The patient is nervous/anxious.   All other systems reviewed and are negative.    Allergies  Heparin  Home Medications   Current Outpatient Rx  Name  Route  Sig  Dispense  Refill  . amLODipine (NORVASC) 10 MG tablet   Oral   Take 10 mg by mouth every morning.         . Darunavir Ethanolate (PREZISTA) 800 MG tablet   Oral   Take 800 mg by mouth daily with breakfast.         . emtricitabine-tenofovir (TRUVADA) 200-300 MG per tablet   Oral   Take 1 tablet by mouth every morning.         . ritonavir (NORVIR) 100 MG capsule   Oral   Take 100 mg by mouth daily with breakfast.          BP 153/91  Pulse 98  Temp(Src) 98.6 F (37 C) (Oral)  Resp 22  Ht 5\' 8"  (1.727 m)  Wt 140 lb (63.504 kg)  BMI 21.29 kg/m2  SpO2 98% Physical Exam  Nursing note  and vitals reviewed. Constitutional: He is oriented to person, place, and time. He appears well-developed and well-nourished. No distress.  HENT:  Head: Normocephalic and atraumatic.  Mouth/Throat: Oropharynx is clear and moist.  Eyes: Conjunctivae and EOM are normal. Pupils are equal, round, and reactive to light.  Neck: Normal range of motion. Neck supple.  Cardiovascular: Normal rate, regular rhythm and normal heart sounds.   Pulmonary/Chest: Effort normal and breath sounds normal.  Abdominal: Soft. Bowel sounds are normal. There is no tenderness.  Musculoskeletal: Normal range of motion. He exhibits no edema.  Neurological: He is alert and oriented to person, place, and time.  Skin: Skin is warm and dry. He is not diaphoretic.  Psychiatric: His speech is normal and behavior is normal. His  mood appears anxious. He is not actively hallucinating. He exhibits a depressed mood. He expresses suicidal ideation. He expresses no homicidal ideation. He expresses suicidal plans.    ED Course  Procedures (including critical care time) Labs Review Labs Reviewed  CBC  ACETAMINOPHEN LEVEL  COMPREHENSIVE METABOLIC PANEL  ETHANOL  SALICYLATE LEVEL  URINE RAPID DRUG SCREEN (HOSP PERFORMED)   Imaging Review No results found.  EKG Interpretation   None       MDM   1. Suicidal behavior   2. Cocaine abuse   3. Alcohol abuse    Pt presenting voluntarily, suicidal plan, substance abuse. Home meds ordered. TTS consult. Medically cleared.  VINAYAK BOBIER, PA-C 05/10/13 2306

## 2013-05-10 NOTE — ED Notes (Signed)
Pt stated that he is not able to urinate. 

## 2013-05-10 NOTE — ED Notes (Signed)
Pt has one bag of belongings. Pt has been seen and wanded by security.  Pt has wallet and cards in safe.

## 2013-05-11 ENCOUNTER — Encounter (HOSPITAL_COMMUNITY): Payer: Self-pay | Admitting: Emergency Medicine

## 2013-05-11 ENCOUNTER — Encounter (HOSPITAL_COMMUNITY): Payer: Self-pay | Admitting: Registered Nurse

## 2013-05-11 ENCOUNTER — Inpatient Hospital Stay (HOSPITAL_COMMUNITY)
Admission: AD | Admit: 2013-05-11 | Discharge: 2013-05-14 | DRG: 885 | Disposition: A | Discharge status: Home / Self Care | Payer: Medicaid Other | Source: Intra-hospital | Attending: Psychiatry | Admitting: Psychiatry

## 2013-05-11 DIAGNOSIS — F17200 Nicotine dependence, unspecified, uncomplicated: Secondary | ICD-10-CM | POA: Diagnosis present

## 2013-05-11 DIAGNOSIS — F1210 Cannabis abuse, uncomplicated: Secondary | ICD-10-CM | POA: Diagnosis present

## 2013-05-11 DIAGNOSIS — Z8659 Personal history of other mental and behavioral disorders: Secondary | ICD-10-CM

## 2013-05-11 DIAGNOSIS — Z87820 Personal history of traumatic brain injury: Secondary | ICD-10-CM

## 2013-05-11 DIAGNOSIS — F1010 Alcohol abuse, uncomplicated: Secondary | ICD-10-CM

## 2013-05-11 DIAGNOSIS — Z21 Asymptomatic human immunodeficiency virus [HIV] infection status: Secondary | ICD-10-CM | POA: Diagnosis present

## 2013-05-11 DIAGNOSIS — R45851 Suicidal ideations: Secondary | ICD-10-CM

## 2013-05-11 DIAGNOSIS — F4310 Post-traumatic stress disorder, unspecified: Secondary | ICD-10-CM | POA: Diagnosis present

## 2013-05-11 DIAGNOSIS — F489 Nonpsychotic mental disorder, unspecified: Secondary | ICD-10-CM

## 2013-05-11 DIAGNOSIS — F1920 Other psychoactive substance dependence, uncomplicated: Secondary | ICD-10-CM | POA: Diagnosis present

## 2013-05-11 DIAGNOSIS — F70 Mild intellectual disabilities: Secondary | ICD-10-CM | POA: Diagnosis present

## 2013-05-11 DIAGNOSIS — F339 Major depressive disorder, recurrent, unspecified: Secondary | ICD-10-CM | POA: Diagnosis present

## 2013-05-11 DIAGNOSIS — F1410 Cocaine abuse, uncomplicated: Secondary | ICD-10-CM

## 2013-05-11 DIAGNOSIS — Z915 Personal history of self-harm: Secondary | ICD-10-CM

## 2013-05-11 DIAGNOSIS — I10 Essential (primary) hypertension: Secondary | ICD-10-CM | POA: Diagnosis present

## 2013-05-11 DIAGNOSIS — F332 Major depressive disorder, recurrent severe without psychotic features: Principal | ICD-10-CM | POA: Diagnosis present

## 2013-05-11 DIAGNOSIS — F411 Generalized anxiety disorder: Secondary | ICD-10-CM | POA: Diagnosis present

## 2013-05-11 DIAGNOSIS — F200 Paranoid schizophrenia: Secondary | ICD-10-CM

## 2013-05-11 DIAGNOSIS — Z9151 Personal history of suicidal behavior: Secondary | ICD-10-CM

## 2013-05-11 MED ORDER — ALUM & MAG HYDROXIDE-SIMETH 200-200-20 MG/5ML PO SUSP: 30.0000 mL | ORAL | Status: DC | PRN

## 2013-05-11 MED ORDER — ALUM & MAG HYDROXIDE-SIMETH 200-200-20 MG/5ML PO SUSP
30.0000 mL | ORAL | Status: DC | PRN
  Administered 2013-05-13: 30 mL via ORAL

## 2013-05-11 MED ORDER — AMLODIPINE BESYLATE 10 MG PO TABS
10.0000 mg | ORAL_TABLET | Freq: Every morning | ORAL | Status: DC
  Administered 2013-05-12 – 2013-05-14 (×3): 10 mg via ORAL
  Filled 2013-05-11 (×6): qty 1

## 2013-05-11 MED ORDER — ACETAMINOPHEN 325 MG PO TABS: 650.0000 mg | ORAL_TABLET | Freq: Four times a day (QID) | ORAL | Status: DC | PRN

## 2013-05-11 MED ORDER — TRAZODONE HCL 50 MG PO TABS
50.0000 mg | ORAL_TABLET | Freq: Every evening | ORAL | Status: DC | PRN
  Administered 2013-05-11: 50 mg via ORAL
  Filled 2013-05-11: qty 1

## 2013-05-11 MED ORDER — HYDROXYZINE HCL 25 MG PO TABS: 25.0000 mg | ORAL_TABLET | Freq: Four times a day (QID) | ORAL | Status: DC | PRN

## 2013-05-11 MED ORDER — LOPERAMIDE HCL 2 MG PO CAPS: 2.0000 mg | ORAL_CAPSULE | ORAL | Status: DC | PRN

## 2013-05-11 MED ORDER — ONDANSETRON HCL 4 MG PO TABS: 4.0000 mg | ORAL_TABLET | Freq: Three times a day (TID) | ORAL | Status: DC | PRN

## 2013-05-11 MED ORDER — CHLORDIAZEPOXIDE HCL 25 MG PO CAPS: 25.0000 mg | ORAL_CAPSULE | Freq: Every day | ORAL | Status: DC

## 2013-05-11 MED ORDER — CHLORDIAZEPOXIDE HCL 25 MG PO CAPS
25.0000 mg | ORAL_CAPSULE | Freq: Three times a day (TID) | ORAL | Status: AC
  Administered 2013-05-13 (×3): 25 mg via ORAL
  Filled 2013-05-11 (×2): qty 1

## 2013-05-11 MED ORDER — NICOTINE 21 MG/24HR TD PT24
21.0000 mg | MEDICATED_PATCH | Freq: Every day | TRANSDERMAL | Status: DC
  Administered 2013-05-12 – 2013-05-13 (×2): 21 mg via TRANSDERMAL
  Filled 2013-05-11 (×6): qty 1

## 2013-05-11 MED ORDER — DARUNAVIR ETHANOLATE 800 MG PO TABS
800.0000 mg | ORAL_TABLET | Freq: Every day | ORAL | Status: DC
  Administered 2013-05-12 – 2013-05-14 (×3): 800 mg via ORAL
  Filled 2013-05-11 (×5): qty 1

## 2013-05-11 MED ORDER — EMTRICITABINE-TENOFOVIR DF 200-300 MG PO TABS
1.0000 | ORAL_TABLET | Freq: Every morning | ORAL | Status: DC
  Administered 2013-05-12 – 2013-05-14 (×3): 1 via ORAL
  Filled 2013-05-11 (×6): qty 1

## 2013-05-11 MED ORDER — THIAMINE HCL 100 MG/ML IJ SOLN
100.0000 mg | Freq: Once | INTRAMUSCULAR | Status: AC
  Administered 2013-05-11: 100 mg via INTRAMUSCULAR
  Filled 2013-05-11: qty 2

## 2013-05-11 MED ORDER — VITAMIN B-1 100 MG PO TABS
100.0000 mg | ORAL_TABLET | Freq: Every day | ORAL | Status: DC
  Administered 2013-05-12 – 2013-05-14 (×3): 100 mg via ORAL
  Filled 2013-05-11 (×5): qty 1

## 2013-05-11 MED ORDER — CHLORDIAZEPOXIDE HCL 25 MG PO CAPS
25.0000 mg | ORAL_CAPSULE | Freq: Four times a day (QID) | ORAL | Status: AC
  Administered 2013-05-11 – 2013-05-12 (×5): 25 mg via ORAL
  Filled 2013-05-11 (×5): qty 1

## 2013-05-11 MED ORDER — MAGNESIUM HYDROXIDE 400 MG/5ML PO SUSP: 30.0000 mL | Freq: Every day | ORAL | Status: DC | PRN

## 2013-05-11 MED ORDER — CHLORDIAZEPOXIDE HCL 25 MG PO CAPS: 25.0000 mg | ORAL_CAPSULE | Freq: Four times a day (QID) | ORAL | Status: DC | PRN

## 2013-05-11 MED ORDER — RITONAVIR 100 MG PO CAPS
100.0000 mg | ORAL_CAPSULE | Freq: Every day | ORAL | Status: DC
  Administered 2013-05-12 – 2013-05-14 (×3): 100 mg via ORAL
  Filled 2013-05-11 (×5): qty 1

## 2013-05-11 MED ORDER — ONDANSETRON 4 MG PO TBDP: 4.0000 mg | ORAL_TABLET | Freq: Four times a day (QID) | ORAL | Status: DC | PRN

## 2013-05-11 MED ORDER — IBUPROFEN 600 MG PO TABS: 600.0000 mg | ORAL_TABLET | Freq: Three times a day (TID) | ORAL | Status: DC | PRN

## 2013-05-11 MED ORDER — CHLORDIAZEPOXIDE HCL 25 MG PO CAPS
25.0000 mg | ORAL_CAPSULE | Freq: Four times a day (QID) | ORAL | Status: DC | PRN
  Administered 2013-05-11: 25 mg via ORAL
  Filled 2013-05-11: qty 1

## 2013-05-11 MED ORDER — CHLORDIAZEPOXIDE HCL 25 MG PO CAPS: 25.0000 mg | ORAL_CAPSULE | ORAL | Status: DC

## 2013-05-11 MED ORDER — PROPRANOLOL HCL ER 60 MG PO CP24
60.0000 mg | ORAL_CAPSULE | Freq: Every day | ORAL | Status: DC
  Administered 2013-05-12 – 2013-05-14 (×3): 60 mg via ORAL
  Filled 2013-05-11 (×4): qty 1
  Filled 2013-05-11 (×2): qty 14
  Filled 2013-05-11 (×2): qty 1

## 2013-05-11 MED ORDER — ADULT MULTIVITAMIN W/MINERALS CH
1.0000 | ORAL_TABLET | Freq: Every day | ORAL | Status: DC
  Administered 2013-05-11 – 2013-05-14 (×4): 1 via ORAL
  Filled 2013-05-11 (×7): qty 1

## 2013-05-11 NOTE — ED Notes (Signed)
[facility]. Belongings bag x1 and walking cane given to driver. Ambulatory without difficulty. Denies pain. No complaints voiced.

## 2013-05-11 NOTE — Tx Team (Signed)
Initial Interdisciplinary Treatment Plan  PATIENT STRENGTHS: (choose at least two) Average or above average intelligence Communication skills Motivation for treatment/growth Supportive family/friends  PATIENT STRESSORS: Health problems Substance abuse   PROBLEM LIST: Problem List/Patient Goals Date to be addressed Date deferred Reason deferred Estimated date of resolution  Depression 05/11/2013     Suicide Risk 05/11/2013     Alcohol Detox 05/11/2013                                          DISCHARGE CRITERIA:  Ability to meet basic life and health needs Motivation to continue treatment in a less acute level of care Need for constant or close observation no longer present Safe-care adequate arrangements made  PRELIMINARY DISCHARGE PLAN: Outpatient therapy Return to previous living arrangement  PATIENT/FAMIILY INVOLVEMENT: This treatment plan has been presented to and reviewed with the patient, Pam Drownlbert J Poche, and/or family member.  The patient and family have been given the opportunity to ask questions and make suggestions.  Renaee MundaSadler, Averee Harb Thomas 05/11/2013, 4:47 PM

## 2013-05-11 NOTE — ED Notes (Signed)
Notified [facility] of transportation needed to [facility]. 

## 2013-05-11 NOTE — ED Notes (Signed)
Report called to Marylu LundJanet RN. Dr.Jonnalagadda has accepted to room 502-2. Will call [facility] to transport.

## 2013-05-11 NOTE — ED Provider Notes (Signed)
[facility]  Roan Miklos R. Rubin PayorPickering, MD 05/11/13 [phone]
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
## 2013-05-11 NOTE — Progress Notes (Signed)
Patient ID: [name], male   DOB: 13-Nov-1980, 33 y.o.   MRN: [id number] D: Pt. Visible on the unit, in dayroom watching TV, reports drug use "I starting spending money, lost everything" Pt. Reports he's been drinking since he was 33 yo, and using cocaine since 2 years ago. Pt. Reports his parents would be so disappointed in him. Pt. Reports prior to admission he started falling down and realized he needed help so he went to the hospital. A: Writer introduced self to client and provided emotional support encouraged him to continue on the path of sobriety and abstinence from drugs with self-determination and coping skills gained while here. Staff will monitor q6415min for safety checks. R: pt. Is safe on the unit
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
## 2013-05-12 ENCOUNTER — Encounter (HOSPITAL_COMMUNITY): Payer: Self-pay | Admitting: Psychiatry

## 2013-05-12 DIAGNOSIS — R45851 Suicidal ideations: Secondary | ICD-10-CM

## 2013-05-12 MED ORDER — SERTRALINE HCL 25 MG PO TABS
25.0000 mg | ORAL_TABLET | Freq: Every day | ORAL | Status: DC
  Administered 2013-05-12 – 2013-05-14 (×3): 25 mg via ORAL
  Filled 2013-05-12: qty 1
  Filled 2013-05-12: qty 14
  Filled 2013-05-12: qty 1
  Filled 2013-05-12: qty 14
  Filled 2013-05-12 (×3): qty 1

## 2013-05-12 MED ORDER — TRAZODONE HCL 100 MG PO TABS
100.0000 mg | ORAL_TABLET | Freq: Every evening | ORAL | Status: DC | PRN
  Administered 2013-05-12 – 2013-05-13 (×2): 100 mg via ORAL
  Filled 2013-05-12: qty 1

## 2013-05-12 MED ORDER — MIRTAZAPINE 15 MG PO TABS
15.0000 mg | ORAL_TABLET | Freq: Every day | ORAL | Status: DC
  Administered 2013-05-12 – 2013-05-13 (×2): 15 mg via ORAL
  Filled 2013-05-12 (×2): qty 1
  Filled 2013-05-12: qty 14
  Filled 2013-05-12: qty 1
  Filled 2013-05-12: qty 14

## 2013-05-12 NOTE — Progress Notes (Signed)
Patient ID: [name], male   DOB: 06/30/1980, 33 y.o.   MRN: [id number]  D: Pt. Denies SI/HI and A/V Hallucinations to writer today but reports that he had thoughts yesterday. Patient reports his depression and hopelessness at 7-8 today. Patient does not report any pain or discomfort at this time.   A: Support and encouragement provided to the patient for writer to come to me with any questions or concerns. Scheduled medications given to patient per physician's orders.  R: Patient is receptive and cooperative but minimal. Patient is seen in the milieu and is going to groups. Q15 minute checks are maintained for safety.

## 2013-05-12 NOTE — BHH Group Notes (Signed)
[facility]   05/12/2013 8:45 AM  Participation Quality:  Alert, Appropriate and Oriented  Mood/Affect:  Flat and Depressed  Depression Rating:  1  Anxiety Rating:  1  Thoughts of Suicide:  Pt denies SI/HI  Will you contract for safety?   Yes  Current AVH:  Pt denies  Plan for Discharge/Comments:  Pt attended discharge planning group and actively participated in group.  CSW provided pt with today's workbook.  Pt states that [place] can return [place] in [place].  Pt denies having follow up.  CSW will assess for appropriate referrals.  No further needs voiced by pt at this time.    Transportation Means: Pt reports access to transportation - family or friend will pick pt up  Supports: No supports mentioned at this time  Reyes IvanChelsea Horton, LCSW 05/12/2013 9:48 AM

## 2013-05-12 NOTE — Progress Notes (Signed)
Adult Psychoeducational Group Note  Date:  05/12/2013 Time:  9:31 PM  Group Topic/Focus:  Wrap-Up Group:   The focus of this group is to help patients review their daily goal of treatment and discuss progress on daily workbooks.  Participation Level:  Minimal  Participation Quality:  Appropriate  Affect:  Appropriate  Cognitive:  Appropriate  Insight: Appropriate  Engagement in Group:  Engaged  Modes of Intervention:  Support  Additional Comments:  Pt stated that he would rather stay private about his day but that overall it was a good day. Pt was given support and encouraged to share at a later point.   [name] 05/12/2013, 9:31 PM
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
## 2013-05-12 NOTE — BHH Suicide Risk Assessment (Signed)
[facility] Family/Significant Other Suicide Prevention Education  Suicide Prevention Education:   Patient Refusal for Family/Significant Other Suicide Prevention Education: The patient has refused to provide written consent for family/significant other to be provided Family/Significant Other Suicide Prevention Education during admission and/or prior to discharge.  Physician notified.  CSW provided suicide prevention information with patient.    The suicide prevention education provided includes the following:  Suicide risk factors  Suicide prevention and interventions  National Suicide Hotline telephone number  [facility] assessment telephone number  [facility] Emergency Assistance 911  [facility] and/or Residential Mobile Crisis Unit telephone number   Reyes IvanChelsea Horton, KentuckyLCSW 05/12/2013 11:09 AM

## 2013-05-12 NOTE — BHH Group Notes (Signed)
BHH LCSW Group Therapy  05/12/2013  1:15 PM   Type of Therapy:  Group Therapy  Participation Level:  Minimal  Participation Quality:  Attentive but minimal personal participation  Affect:  Depressed and Flat  Cognitive:  Alert and Oriented  Insight: Limited, Lacking  Engagement in Therapy:  Limited, Lacking  Modes of Intervention:  Clarification, Confrontation, Discussion, Education, Exploration, Limit-setting, Orientation, Problem-solving, Rapport Building, Dance movement psychotherapisteality Testing, Socialization and Support  Summary of Progress/Problems: Pt identified obstacles faced currently and processed barriers involved in overcoming these obstacles. Pt identified steps necessary for overcoming these obstacles and explored motivation (internal and external) for facing these difficulties head on. Pt further identified one area of concern in their lives and chose a goal to focus on for today. Pt declined to participate in group discussion, stating that he doesn't like to share his personal information and is a private person.  Pt actively listened to group discussion but did not share.    Gary IvanChelsea Horton, LCSW 05/12/2013 2:57 PM

## 2013-05-12 NOTE — Progress Notes (Signed)
Patient ID: [name], male   DOB: [birth year], 33 y.o.   MRN: [id number] D: pt. Expressed concerns of where he will be going at discharge, interested in long-term care, but [facility] at this [facility], per client. Pt. Reports he can go stay with a friend for a week, but afterwards he would have to find somewhere to stay. Pt. Reports staying with his parents aren't an option as they are strict church people and would not approve of his lifestyle. A: Writer encouraged pt. To follow up with treatment suggestions and hopefully he will able to go into treatment center later. Pt. Express concerns that if he didn't go in post discharge he would relapse. Pt. Encouraged to use coping skills to help avoid temptation. Staff will monitor q6415min for safety. R: Pt. Is safe on the unit and attends group.

## 2013-05-12 NOTE — Progress Notes (Signed)
Recreation Therapy Notes  Date: 02.02.2015 Time: 2:45pm Location: [address] Dayroom   Group Topic: Wellness  Goal Area(s) Addresses:  Patient will define components of whole wellness. Patient will verbalize benefit of whole wellness.  Behavioral Response: Appropriate, Observation  Intervention: Mind Map  Activity: Patients were provided a worksheet with a bubble chart. As a group patients were asked to define the dimensions of wellness - Physical, Mental, Emotional, Social, Intellectual, Environmental, Leisure, and Spiritual. Patients were then asked to identify examples of ways they can invest in each dimension of wellness.   Education: Wellness, Building control surveyorDischarge Planning, Coping Skills   Education Outcome: Acknowledges understanding  Clinical Observations/Feedback: Patient attended group session, but did not make an statements or contributions. Patient did appear to actively listen as he maintained appropriate eye contact with speaker.   Marykay Lexenise L Anivea Velasques, LRT/CTRS  Jearl KlinefelterBlanchfield, Mylez Venable L 05/12/2013 5:26 PM
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
## 2013-05-12 NOTE — Consult Note (Signed)
Agree with plan 

## 2013-05-12 NOTE — Tx Team (Signed)
Interdisciplinary Treatment Plan Update (Adult)  Date: 05/12/2013  Time Reviewed:  9:45 AM  Progress in Treatment: Attending groups: Yes Participating in groups:  Yes Taking medication as prescribed:  Yes Tolerating medication:  Yes Family/Significant othe contact made: CSW assessing  Patient understands diagnosis:  Yes Discussing patient identified problems/goals with staff:  Yes Medical problems stabilized or resolved:  Yes Denies suicidal/homicidal ideation: Yes Issues/concerns per patient self-inventory:  Yes Other:  New problem(s) identified: N/A  Discharge Plan or Barriers: CSW assessing for appropriate referrals.  Reason for Continuation of Hospitalization: Anxiety Depression Medication Stabilization  Comments: N/A  Estimated length of stay: 3-5 days  For review of initial/current patient goals, please see plan of care.  Attendees: Patient:     Family:     Physician:  Dr. Johnalagadda 05/12/2013 10:13 AM   Nursing:   Beverly Knight, RN 05/12/2013 10:13 AM   Clinical Social Worker:  Jermisha Hoffart Horton, LCSW 05/12/2013 10:13 AM   Other: Christa Dopson, RN 05/12/2013 10:13 AM   Other:  Delora Sutton, care coordination 05/12/2013 10:13 AM   Other:  Jennifer Clark, case manager 05/12/2013 10:13 AM   Other:  Kim Maggio, RN 05/12/2013 10:16 AM   Other:    Other:    Other:    Other:    Other:    Other:     Scribe for Treatment Team:   Horton, Jakyra Kenealy Nicole, 05/12/2013 10:13 AM    

## 2013-05-12 NOTE — [facility] Suicide Risk Assessment (Signed)
   Nursing information obtained from:  Patient Demographic factors:  Male;Gay, lesbian, or bisexual orientation;Low socioeconomic status;Unemployed Current Mental Status:  NA Loss Factors:  Decline in physical health;Financial problems / change in socioeconomic status Historical Factors:  NA Risk Reduction Factors:  Living with another person, especially a relative Total Time spent with patient: 45 minutes  CLINICAL FACTORS:   Depression:   Anhedonia Comorbid alcohol abuse/dependence Hopelessness Impulsivity Insomnia Recent sense of peace/wellbeing Severe Alcohol/Substance Abuse/Dependencies Unstable or Poor Therapeutic Relationship Previous Psychiatric Diagnoses and Treatments Medical Diagnoses and Treatments/Surgeries  Psychiatric Specialty Exam: Physical Exam  Review of Systems  All other systems reviewed and are negative.    Blood pressure 114/72, pulse 76, temperature 98 F (36.7 C), temperature source Oral, resp. rate 16, height 5\' 6"  (1.676 m), weight 63.05 kg (139 lb).Body mass index is 22.45 kg/(m^2).  General Appearance: Guarded  Eye Contact::  Fair  Speech:  Clear and Coherent and Normal Rate  Volume:  Decreased  Mood:  Anxious, Depressed, Hopeless and Worthless  Affect:  Depressed and Flat  Thought Process:  Goal Directed and Intact  Orientation:  Full (Time, Place, and Person)  Thought Content:  Rumination  Suicidal Thoughts:  Yes.  with intent/plan  Homicidal Thoughts:  No  Memory:  Immediate;   Fair  Judgement:  Impaired  Insight:  Lacking  Psychomotor Activity:  Psychomotor Retardation  Concentration:  Fair  Recall:  Fair  Fund of Knowledge:Good  Language: Good  Akathisia:  NA  Handed:  Right  AIMS ((lungs his lungs this and does things are if indicated):     Assets:  Communication Skills Desire for Improvement Physical Health Resilience  Sleep:  Number of Hours: 6.75   Musculoskeletal: Strength & Muscle Tone: within normal limits Gait &  Station: normal Patient leans: N/A  COGNITIVE FEATURES THAT CONTRIBUTE TO RISK:  Closed-mindedness Loss of executive function Polarized thinking    SUICIDE RISK:  Moderate:  Frequent suicidal ideation with limited intensity, and duration, some specificity in terms of plans, no associated intent, good self-control, limited dysphoria/symptomatology, some risk factors present, and identifiable protective factors, including available and accessible social support.  PLAN OF CARE: Admitted for crisis stabilization, safety monitoring and medication management of poly substance dependence, depression and suicide ideation.  I certify that inpatient services furnished can reasonably be expected to improve the patient's condition.  Ridgely Anastacio,JANARDHAHA R. 05/12/2013, 3:39 PM

## 2013-05-13 DIAGNOSIS — F10239 Alcohol dependence with withdrawal, unspecified: Secondary | ICD-10-CM

## 2013-05-13 DIAGNOSIS — F4310 Post-traumatic stress disorder, unspecified: Secondary | ICD-10-CM

## 2013-05-13 DIAGNOSIS — F1010 Alcohol abuse, uncomplicated: Secondary | ICD-10-CM

## 2013-05-13 DIAGNOSIS — F332 Major depressive disorder, recurrent severe without psychotic features: Principal | ICD-10-CM

## 2013-05-13 DIAGNOSIS — F10939 Alcohol use, unspecified with withdrawal, unspecified: Secondary | ICD-10-CM

## 2013-05-13 DIAGNOSIS — F411 Generalized anxiety disorder: Secondary | ICD-10-CM
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
## 2013-05-13 NOTE — [facility] Group Notes (Signed)
[facility] LCSW Group Therapy  05/13/2013  1:15 PM   Type of Therapy:  Group Therapy  Participation Level:  Active  Participation Quality:  Attentive, Sharing and Supportive  Affect:  Depressed and Flat  Cognitive:  Alert and Oriented  Insight:  Developing/Improving and Engaged  Engagement in Therapy:  Developing/Improving and Engaged  Modes of Intervention:  Activity, Clarification, Confrontation, Discussion, Education, Exploration, Limit-setting, Orientation, Problem-solving, Rapport Building, Reality Testing, Socialization and Support  Summary of Progress/Problems: Patient was attentive and engaged with speaker from [employer].  Patient was attentive to speaker while they shared their story of dealing with mental health and overcoming it.  Patient expressed interest in their programs and services and received information on their agency.  Patient processed ways they can relate to the speaker.     Gary Talcott Horton, LCSW 05/13/2013 1:44 PM   

## 2013-05-13 NOTE — Progress Notes (Signed)
Pt got up this am and took his meds. He came to group but has been very quiet and at times appeared to be nodding off. Pt states he feels pretty good today and slept good. Pt denies SI and HI and contracts for safety. Pt is to be moved to the [address] hall today.

## 2013-05-13 NOTE — Progress Notes (Signed)
D: Pt denies SI/HI/AVH. Pt is pleasant and cooperative. Pt does not forward much information. Pt stated he was ok and doing fine.  A: Pt was offered support and encouragement. Pt was given scheduled medications. Pt was encourage to attend groups. Q 15 minute checks were done for safety.  R:Pt attends groups and interacts well with peers and staff. Pt is taking medication. Pt has no complaints at this time.Pt receptive to treatment and safety maintained on unit.

## 2013-05-13 NOTE — [facility] Group Notes (Signed)
[facility] Group Notes:  (Nursing/MHT/Case Management/Adjunct)  Date:  05/13/2013  Time:  10:38 AM  Type of Therapy:  Nurse Education  Participation Level:  None  Participation Quality:  Appropriate  Affect:  Appropriate  Cognitive:  Appropriate  Insight:  None  Engagement in Group:  None  Modes of Intervention:  Discussion  Summary of Progress/Problems:pt did not participate in group  Rodman KeyWebb, Madalyne Husk [facility] 05/13/2013, 10:38 AM

## 2013-05-13 NOTE — Progress Notes (Signed)
Recreation Therapy Notes  Animal-Assisted Activity/Therapy (AAA/T) Program Checklist/Progress Notes Patient Eligibility Criteria Checklist & Daily Group note for Rec Tx Intervention  Date: 02.03.2015 Time: 2:45pm [address]   AAA/T Program Assumption of Risk Form signed by Patient/ or Parent Legal Guardian yes  Patient is free of allergies or sever asthma yes  Patient reports no fear of animals yes  Patient reports no history of cruelty to animals yes   Patient understands his/her participation is voluntary yes  Behavioral Response: Did not attend.   Marykay Lexenise L Agapito Hanway, LRT/CTRS  Antoney Biven L 05/13/2013 3:14 PM

## 2013-05-13 NOTE — Progress Notes (Signed)
Adult Psychoeducational Group Note  Date:  05/13/2013 Time:  9:24 PM  Group Topic/Focus:  Wrap-Up Group:   The focus of this group is to help patients review their daily goal of treatment and discuss progress on daily workbooks.  Participation Level:  Minimal  Participation Quality:  Attentive  Affect:  Flat  Cognitive:  Alert  Insight: Appropriate  Engagement in Group:  Limited  Modes of Intervention:  Discussion  Additional Comments:  Patient says that he slept all day.  Percell LocusJOHNSON,TAWANA 05/13/2013, 9:24 PM

## 2013-05-14 MED ORDER — EMTRICITABINE-TENOFOVIR DF 200-300 MG PO TABS: 1.0000 | ORAL_TABLET | Freq: Every morning | ORAL | Status: DC

## 2013-05-14 MED ORDER — DARUNAVIR ETHANOLATE 800 MG PO TABS: 800.0000 mg | ORAL_TABLET | Freq: Every day | ORAL | Status: DC

## 2013-05-14 MED ORDER — MIRTAZAPINE 15 MG PO TABS
15.0000 mg | ORAL_TABLET | Freq: Every day | ORAL | Status: DC
Start: 2013-05-14 — End: 2015-07-29

## 2013-05-14 MED ORDER — RITONAVIR 100 MG PO CAPS: 100.0000 mg | ORAL_CAPSULE | Freq: Every day | ORAL | Status: DC

## 2013-05-14 MED ORDER — PROPRANOLOL HCL ER 60 MG PO CP24: 60.0000 mg | ORAL_CAPSULE | Freq: Every day | ORAL | Status: DC

## 2013-05-14 MED ORDER — TRAZODONE HCL 50 MG PO TABS
50.0000 mg | ORAL_TABLET | Freq: Every evening | ORAL | Status: DC | PRN
  Filled 2013-05-14 (×4): qty 1

## 2013-05-14 MED ORDER — SERTRALINE HCL 25 MG PO TABS: 25.0000 mg | ORAL_TABLET | Freq: Every day | ORAL | Status: DC

## 2013-05-14 NOTE — [facility] Suicide Risk Assessment (Signed)
   Demographic Factors:  Male, Adolescent or young adult, Low socioeconomic status, Living alone and Unemployed  Total Time spent with patient: 30 minutes  Psychiatric Specialty Exam: Physical Exam  ROS  Blood pressure 124/80, pulse 71, temperature 97.9 F (36.6 C), temperature source Oral, resp. rate 16, height 5\' 6"  (1.676 m), weight 63.05 kg (139 lb).Body mass index is 22.45 kg/(m^2).  General Appearance: Casual  Eye Contact::  Fair  Speech:  Clear and Coherent  Volume:  Normal  Mood:  Depressed  Affect:  Depressed  Thought Process:  Goal Directed and Intact  Orientation:  Full (Time, Place, and Person)  Thought Content:  WDL  Suicidal Thoughts:  No  Homicidal Thoughts:  No  Memory:  Immediate;   Fair  Judgement:  Fair  Insight:  Fair  Psychomotor Activity:  Normal  Concentration:  Fair  Recall:  [employer]Fair  Fund of Knowledge:Fair  Language: Good  Akathisia:  Yes  Handed:  Right  AIMS (if indicated):     Assets:  Communication Skills Desire for Improvement Physical Health Resilience Social Support  Sleep:  Number of Hours: 6.75    Musculoskeletal: Strength & Muscle Tone: within normal limits Gait & Station: normal, walks with cane Patient leans: N/A   Mental Status Per Nursing Assessment::   On Admission:  NA  Current Mental Status by Physician: NA  Loss Factors: Decrease in vocational status and Financial problems/change in socioeconomic status  Historical Factors: Impulsivity  Risk Reduction Factors:   Sense of responsibility to family, Religious beliefs about death, Living with another person, especially a relative, Positive social support, Positive therapeutic relationship and Positive coping skills or problem solving skills  Continued Clinical Symptoms:  Depression:   Recent sense of peace/wellbeing Alcohol/Substance Abuse/Dependencies Chronic Pain Previous Psychiatric Diagnoses and Treatments Medical Diagnoses and Treatments/Surgeries  Cognitive  Features That Contribute To Risk:  Polarized thinking    Suicide Risk:  Minimal: No identifiable suicidal ideation.  Patients presenting with no risk factors but with morbid ruminations; may be classified as minimal risk based on the severity of the depressive symptoms  Discharge Diagnoses:   AXIS I:  Major Depression, Recurrent severe, Substance Induced Mood Disorder and Polysubstance dependence AXIS II:  Deferred AXIS III:   Past Medical History  Diagnosis Date  . Psychiatric problem   . Hx of suicide attempt 07/28/2011    Age 33.  Pt stated he wanted to kill himself and he was admitted to a mental health facility for a week or so.   Marland Kitchen. HIV infection   . Hypertension   . HIV (human immunodeficiency virus infection)    AXIS IV:  economic problems, occupational problems, other psychosocial or environmental problems, problems related to social environment and problems with primary support group AXIS V:  51-60 moderate symptoms  Plan Of Care/Follow-up recommendations:  Activity:  As tolerated Diet:  Regular  Is patient on multiple antipsychotic therapies at discharge:  No   Has Patient had three or more failed trials of antipsychotic monotherapy by history:  No  Recommended Plan for Multiple Antipsychotic Therapies: NA    [name],JANARDHAHA R. 05/14/2013, 12:22 PM

## 2013-05-14 NOTE — [facility] Group Notes (Signed)
[facility] LCSW Group Therapy  05/14/2013  1:15 PM   Type of Therapy:  Group Therapy  Participation Level:  Minimal  Participation Quality:  Minimal  Affect:  Calm  Cognitive:  Alert and Oriented  Insight:  Limited, Lacking  Engagement in Therapy:  Limited, Lacking  Modes of Intervention:  Clarification, Confrontation, Discussion, Education, Exploration, Limit-setting, Orientation, Problem-solving, Rapport Building, Dance movement psychotherapisteality Testing, Socialization and Support  Summary of Progress/Problems: The topic for group today was emotional regulation.  This group focused on both positive and negative emotion identification and allowed group members to process ways to identify feelings, regulate negative emotions, and find healthy ways to manage internal/external emotions. Group members were asked to reflect on a time when their reaction to an emotion led to a negative outcome and explored how alternative responses using emotion regulation would have benefited them. Group members were also asked to discuss a time when emotion regulation was utilized when a negative emotion was experienced.  Pt chose to not discuss or share anything after much prompting, stating that he didn't want to discuss his personal life and that it was private.    Reyes IvanChelsea Horton, LCSW 05/14/2013 2:58 PM

## 2013-05-14 NOTE — Progress Notes (Signed)
Discharge Note: Discharge instructions/prescriptions/medication samples given to patient by other RN staff. Patient verbalized understanding of discharge instructions and prescriptions. Returned belongings to patient. Denies SI/HI/AVH. Patient d/c without incident to the lobby by other RN staff.

## 2013-05-14 NOTE — Progress Notes (Signed)
Adult Psychoeducational Group Note  Date:  05/14/2013 Time:  1:15 PM  Group Topic/Focus:  Personal Choices and Values:   The focus of this group is to help patients assess and explore the importance of values in their lives, how their values affect their decisions, how they express their values and what opposes their expression.  Participation Level:  Active  Participation Quality:  Attentive  Affect: lack   Cognitive:  Appropriate  Insight: Appropriate  Engagement in Group:  lack  Modes of Intervention:  Discussion  Additional Comments:  Pt. Did not have much to say  Tonita CongMcLaurin, Angalina Ante L 05/14/2013, 1:15 PM

## 2013-05-14 NOTE — Progress Notes (Signed)
Recreation Therapy Notes  Date: 02.04.2015 Time: 2:45pm Location: [address] Dayroom    Group Topic: Communication, Team Building, Problem Solving  Goal Area(s) Addresses:  Patient will effectively work with peers towards shared goal.  Patient will identify skill used to make activity successful.  Patient will identify how skills used during activity can be used to reach post d/c goals.   Behavioral Response: Did not attend.   Marykay Lexenise L Valborg Friar, LRT/CTRS  Maico Mulvehill L 05/14/2013 5:00 PM

## 2013-05-14 NOTE — Progress Notes (Signed)
[facility]  Will you be returning to the same living situation after discharge: Yes,  returning to friend's house At discharge, do you have transportation home?:Yes,  provided pt with a bus pass Do you have the ability to pay for your medications:Yes,  provided pt with samples and prescriptions.  [facility].   Release of information consent forms completed and in the chart;  Patient's signature needed at discharge.  Patient to Follow up at: Follow-up Information   Follow up with [facility] On 05/16/2013. (Walk in on this date for hospital discharge appointment. Walk in clinic is Monday - Friday 8 am - 3 pm. They will than schedule you for medication management and therapy.)    Contact information:   [address] [place], [place] [address] Phone: [phone] Fax: [phone]      Patient denies SI/HI:   Yes,  denies SI/HI    Safety Planning and Suicide Prevention discussed:  Yes,  discussed with pt.  Pt refused consent to contact family/friend.  See suicide prevention education note.   Carmina MillerHorton, Tayna [place]eth[place]st Nicole 05/14/2013, 10:41 AM

## 2013-05-14 NOTE — Tx Team (Signed)
Interdisciplinary Treatment Plan Update (Adult)  Date: 05/14/2013  Time Reviewed:  9:45 AM  Progress in Treatment: Attending groups: Yes Participating in groups:  Yes Taking medication as prescribed:  Yes Tolerating medication:  Yes Family/Significant othe contact made: No, pt refused Patient understands diagnosis:  Yes Discussing patient identified problems/goals with staff:  Yes Medical problems stabilized or resolved:  Yes Denies suicidal/homicidal ideation: Yes Issues/concerns per patient self-inventory:  Yes Other:  New problem(s) identified: N/A  Discharge Plan or Barriers: [facility].    Reason for Continuation of Hospitalization: Stable to d/c today  Comments: N/A  Estimated length of stay: D/C today  For review of initial/current patient goals, please see plan of care.  Attendees: Patient:  Pam Drownlbert J Musselman  05/14/2013 10:22 AM   Family:     Physician:  Dr. Javier GlazierJohnalagadda 05/14/2013 10:10 AM   Nursing:   Lowella Griponeicia Byrd, RN 05/14/2013 10:10 AM   Clinical Social Worker:  Reyes Ivanhelsea Horton, LCSW 05/14/2013 10:10 AM   Other: Claudette Headonrad Withrow, PA 05/14/2013 10:10 AM   Other:  Sherrye PayorValerie Noch, care coordination 05/14/2013 10:10 AM   Other:  Juline PatchQuylle Hodnett, LCSW 05/14/2013 10:10 AM   Other:  Marzetta Boardhrista Dopson, RN 05/14/2013 10:11 AM   Other:  Quintella ReichertBeverly Knight, RN 05/14/2013 10:11 AM   Other:    Other:    Other:    Other:    Other:     Scribe for Treatment Team:   Carmina MillerHorton, Tolulope Pinkett Nicole, 05/14/2013 10:10 AM
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
## 2013-05-14 NOTE — ED Provider Notes (Signed)
Medical screening examination/treatment/procedure(s) were performed by non-physician practitioner and as supervising physician I was immediately available for consultation/collaboration.  EKG Interpretation   None         Amrita Radu, MD 05/14/13 0708 

## 2013-05-19 NOTE — Progress Notes (Signed)
Patient Discharge Instructions:  After Visit Summary (AVS):   Faxed to:  05/19/13 Discharge Summary Note:   Faxed to:  05/19/13 Psychiatric Admission Assessment Note:   Faxed to:  05/19/13 Suicide Risk Assessment - Discharge Assessment:   Faxed to:  05/19/13 Faxed/Sent to the Next Level Care provider:  05/19/13 [facility] [phone]  Jerelene ReddenSheena E , 05/19/2013, 4:26 PM

## 2013-07-31 ENCOUNTER — Other Ambulatory Visit: Payer: Self-pay | Admitting: Internal Medicine

## 2013-07-31 ENCOUNTER — Other Ambulatory Visit: Payer: Self-pay | Admitting: *Deleted

## 2013-07-31 DIAGNOSIS — B20 Human immunodeficiency virus [HIV] disease: Secondary | ICD-10-CM

## 2013-07-31 MED ORDER — EMTRICITABINE-TENOFOVIR DF 200-300 MG PO TABS: 1.0000 | ORAL_TABLET | Freq: Every morning | ORAL | Status: DC

## 2013-07-31 MED ORDER — DARUNAVIR ETHANOLATE 800 MG PO TABS: 800.0000 mg | ORAL_TABLET | Freq: Every day | ORAL | Status: DC

## 2013-07-31 MED ORDER — RITONAVIR 100 MG PO CAPS: 100.0000 mg | ORAL_CAPSULE | Freq: Every day | ORAL | Status: DC

## 2013-08-12 ENCOUNTER — Other Ambulatory Visit: Payer: Self-pay | Admitting: Licensed Clinical Social Worker

## 2013-08-12 MED ORDER — AMLODIPINE BESYLATE 10 MG PO TABS: 10.0000 mg | ORAL_TABLET | Freq: Every morning | ORAL | Status: DC

## 2013-08-25 ENCOUNTER — Other Ambulatory Visit: Payer: Self-pay

## 2013-09-08 ENCOUNTER — Ambulatory Visit: Payer: Self-pay | Admitting: Internal Medicine

## 2013-09-09 ENCOUNTER — Other Ambulatory Visit: Payer: Medicaid Other

## 2013-09-09 DIAGNOSIS — Z79899 Other long term (current) drug therapy: Secondary | ICD-10-CM

## 2013-09-09 DIAGNOSIS — Z113 Encounter for screening for infections with a predominantly sexual mode of transmission: Secondary | ICD-10-CM

## 2013-09-09 DIAGNOSIS — B20 Human immunodeficiency virus [HIV] disease: Secondary | ICD-10-CM

## 2013-09-09 LAB — CBC WITH DIFFERENTIAL/PLATELET
BASOS PCT: 1 % (ref 0–1)
Basophils Absolute: 0.1 10*3/uL (ref 0.0–0.1)
Eosinophils Absolute: 0.2 10*3/uL (ref 0.0–0.7)
Eosinophils Relative: 2 % (ref 0–5)
HCT: 39.4 % (ref 39.0–52.0)
HEMOGLOBIN: 13.1 g/dL (ref 13.0–17.0)
LYMPHS PCT: 21 % (ref 12–46)
Lymphs Abs: 1.7 10*3/uL (ref 0.7–4.0)
MCH: 30 pg (ref 26.0–34.0)
MCHC: 33.2 g/dL (ref 30.0–36.0)
MCV: 90.2 fL (ref 78.0–100.0)
MONOS PCT: 7 % (ref 3–12)
Monocytes Absolute: 0.6 10*3/uL (ref 0.1–1.0)
NEUTROS ABS: 5.5 10*3/uL (ref 1.7–7.7)
NEUTROS PCT: 69 % (ref 43–77)
Platelets: 481 10*3/uL — ABNORMAL HIGH (ref 150–400)
RBC: 4.37 MIL/uL (ref 4.22–5.81)
RDW: 12.6 % (ref 11.5–15.5)
WBC: 7.9 10*3/uL (ref 4.0–10.5)

## 2013-09-10 LAB — COMPLETE METABOLIC PANEL WITH GFR
ALBUMIN: 4.7 g/dL (ref 3.5–5.2)
ALK PHOS: 59 U/L (ref 39–117)
ALT: <8 U/L (ref 0–53)
AST: 13 U/L (ref 0–37)
BUN: 13 mg/dL (ref 6–23)
CO2: 23 mEq/L (ref 19–32)
Calcium: 9.8 mg/dL (ref 8.4–10.5)
Chloride: 101 mEq/L (ref 96–112)
Creat: 1.24 mg/dL (ref 0.50–1.35)
GFR, EST NON AFRICAN AMERICAN: 75 mL/min
GFR, Est African American: 87 mL/min
Glucose, Bld: 84 mg/dL (ref 70–99)
POTASSIUM: 4.6 meq/L (ref 3.5–5.3)
SODIUM: 135 meq/L (ref 135–145)
Total Bilirubin: 0.5 mg/dL (ref 0.2–1.2)
Total Protein: 7.9 g/dL (ref 6.0–8.3)

## 2013-09-10 LAB — LIPID PANEL
Cholesterol: 184 mg/dL (ref 0–200)
HDL: 46 mg/dL (ref >39)
LDL CALC: 114 mg/dL — AB (ref 0–99)
TRIGLYCERIDES: 119 mg/dL (ref <150)
Total CHOL/HDL Ratio: 4 Ratio
VLDL: 24 mg/dL (ref 0–40)

## 2013-09-10 LAB — RPR

## 2013-09-10 LAB — T-HELPER CELL (CD4) - (RCID CLINIC ONLY)
CD4 % Helper T Cell: 46 % (ref 33–55)
CD4 T Cell Abs: 830 /uL (ref 400–2700)

## 2013-09-11 LAB — HIV-1 RNA QUANT-NO REFLEX-BLD
HIV 1 RNA Quant: <20 copies/mL (ref <20)
HIV-1 RNA Quant, Log: <1.3 {Log} (ref <1.30)

## 2013-09-23 ENCOUNTER — Ambulatory Visit: Payer: Self-pay | Admitting: Internal Medicine

## 2013-09-25 ENCOUNTER — Ambulatory Visit: Payer: Self-pay | Admitting: Internal Medicine

## 2013-09-30 ENCOUNTER — Encounter: Payer: Self-pay | Admitting: Internal Medicine

## 2013-09-30 ENCOUNTER — Ambulatory Visit (INDEPENDENT_AMBULATORY_CARE_PROVIDER_SITE_OTHER): Payer: Medicaid Other | Admitting: Internal Medicine

## 2013-09-30 VITALS — BP 152/94 | HR 70 | Temp 98.6°F | Ht 65.0 in | Wt 146.0 lb

## 2013-09-30 DIAGNOSIS — B20 Human immunodeficiency virus [HIV] disease: Secondary | ICD-10-CM

## 2013-09-30 DIAGNOSIS — Z22322 Carrier or suspected carrier of Methicillin resistant Staphylococcus aureus: Secondary | ICD-10-CM

## 2013-09-30 MED ORDER — MUPIROCIN 2 % EX OINT: 1.0000 "application " | TOPICAL_OINTMENT | Freq: Two times a day (BID) | CUTANEOUS | Status: DC

## 2013-09-30 MED ORDER — CHLORHEXIDINE GLUCONATE 4 % EX LIQD: Freq: Every day | CUTANEOUS | Status: DC

## 2013-09-30 NOTE — Progress Notes (Signed)
  Subjective:    Patient ID: [name], male    DOB: 1981-03-05, 33 y.o.   MRN: [id number]  HPI  For routine followup of his HIV.  He continues on Prezista, Norvir and Truvada.  He denies any missed doses. His CD4 count is 830 with an undetectable viral load. He takes his medication daily.   He has established with a primary care physician.  No weight loss.  Was admitted for SI several months ago, polysubstance abuse.    Also with a recent boil on his elbow, has had several over last few months. No known culture   Review of Systems  Constitutional: Negative for appetite change and fatigue.  HENT: Negative for sore throat and trouble swallowing.   Eyes: Negative for visual disturbance.  Respiratory: Negative for shortness of breath.   Cardiovascular: Negative for leg swelling.  Gastrointestinal: Negative for nausea, abdominal pain and diarrhea.  Musculoskeletal: Negative for arthralgias and myalgias.  Skin: Negative for rash.  Neurological: Negative for dizziness, light-headedness and headaches.  Hematological: Negative for adenopathy.  Psychiatric/Behavioral: Negative for dysphoric mood.       Objective:   Physical Exam  Constitutional: He appears well-developed and well-nourished. No distress.  HENT:  Mouth/Throat: No oropharyngeal exudate.  Eyes: Right eye exhibits no discharge. Left eye exhibits no discharge. No scleral icterus.  Cardiovascular: Normal rate, regular rhythm and normal heart sounds.   No murmur heard. Pulmonary/Chest: Effort normal and breath sounds normal. No respiratory distress. He has no wheezes.  Skin: Skin is warm and dry. No rash noted.  Open, lanced area with pus on left elbow  Psychiatric: He has a normal mood and affect. His behavior is normal.          Assessment & Plan:

## 2013-09-30 NOTE — Patient Instructions (Signed)
[facility] Five day Treatment Plan for MRSA (Staph) Decolonization  Please let us know if you have questions or concerns or do not understand the information we give you.  Prepare Chose a period when you will be uninterrupted by going away or other distractions. To ensure that your skin is in good condition, follow the Routine Skin Care principles below to reduce drying and enhance healing. Do not start while you have any active boils. Routine Skin Care principles to reduce drying and enhance healing:  Avoid the use of soap when bathing or showering. DO NOT routinely use antiseptic solutions. If you need to use something, chose a soap substitute (examples -QV Wash or Cetaphil).   When drying with a towel, be gentle and pat dry your skin. Avoid rubbing the skin.   Reduce the overall frequency of bathing or showering. A short shower (3 minutes) is better than a bath in terms of its effect on the skin.   Use a simple sorbelene based-cream on your skin prior to showering and immediately after drying. (Examples: Hydroderm or other Sorbelene-based preparations). Especially protect healing or dry areas of skin in this way. Don't use a barrier cream with a vaseline base or with perfumes and additives. You are more likely to be allergic to these products, and cause further damage to your skin.   Make sure that you clean and cover any skin cuts or grazes that occur. Try to avoid picking or biting fingernails and the skin around the nails Keep your fingernails clipped short and clean to reduce problems caused by scratching.   For itchy skin, try gently massaging sorbelene-based cream into itchy areas instead of scratching it. A long-acting non-sedating antihistamine drug is the next option to try. However make sure that you are not taking medication that will interact with this drug type.                                                                         To prepare yourself  for your treatment, it is recommended that you complete the following steps:  Remove nose, ear and other body piercing items for several days prior to the treatment and keep them out during the treatment period   Purchase a new toothbrush, disposable razor (if used), sterident for dentures (if required) and a container of alcohol hand hygiene solution (gel or rub)   Discard old toothbrushes and razors when the treatment starts. Also discard opened deodorant rollers, skin adhesive tapes, skin creams and solutions- all of these may already be contaminated with staph   Discard pumice stone(s), sponges and disposable face cloths if used    Discard all make-up brushes, creams, and implements   Discard or hot wash all fluffy toys   Wash hair brush and comb, nail files, plastic toys and cutters in the dishwasher or purchase new ones   Remove nail varnish and artificial nails  Daily routine for 5 days     *Minimize contact with members of the community during the 5 days of treatment* Body washes The effectiveness of the program increases if the correct procedure is used:  Apply the provided antiseptic body wash (2% chlorhexidine) in the shower daily   Take   care to wash hair, under the arms and into the groin and into any folds of skin   Allow the antiseptic to remain on the skin for at least 5 minutes  Nasal ointment  Disinfect your hands with alcohol gel/rub and allow to dry    Open the mupirocin 2% (Bactroban) nasal ointment.   Place small amount (size of match head) of ointment onto a clean cotton swab and massage gently around the inside of the nostril on one side, making sure not to insert it too deeply (no more than 2 cm or a little less than an inch).   Use a new cotton bud for the other nostril so that you do not contaminate the Bactroban tube with staph.    After applying the ointment, press a finger against the nose next to the nostril opening and use a circular motion to  spread the ointment within the nose   Apply the mupirocin ointment two times a day for 5 days   Disinfect your hands with alcohol rub/gel after applying the ointmentp>  Personal items (combs, razor, eyeglasses, jewelry, etc.)     Disinfect all personal items daily with an alcohol-based cleanser  House Environment and Clothes/Linens  On day 2 and 5 of the treatment, clean your house, (especially the bedroom and bathroom). Clean dust off all surfaces and then vacuum clean all floor surfaces AND soft furnishings (such as your favorite chair). If your chair/couch has a vinyl or leather covering then wipe over the chair with warm soapy water and then dry with a clean towel (which should then be washed). Staph lives in skin scales from humans that contaminate the environment. This can lead to re-infection.    Disinfect the shower floor and/or bath tub daily    On days 1, 3, AND 5 of the treatment wash your clothes, underwear, pajamas and bed linen (such as towels, sheets, washcloths, and bath mats). A hot wash with laundry detergent is best (there is no need to use expensive laundry detergent or powder). Dry clothes in sun if possible. Change into clean clothes or pajamas on those days after your shower.    Do not share or exchange any personal items of clothing  Sports/Gym     Surfaces, equipment and towels, and skin-to-skin contact are all potential sources for staph re-infection Pets Dogs and other companion animals can also be colonized with the same strains of MRSA. Best to wash or replace bedding material for the animal and wash the animal at least once during the treatment period with antiseptic solution (2% chlorhexidine wash). Ensure that the skin of the animal is kept in good condition. If the pet has any chronic skin disorders, consult your vet prior to starting your treatment process. What about my partner, family, or household members? Usually when an aggressive strain of staph moves in  to a family or household, only certain members of that group get infections (boils). This is despite the fact that the strain has probably transferred itself from person to person within the group. Those without boils may also be carrying the bacterium, however they must have better resistance (immunity) or perhaps have better skin condition. Staph likes to invade through cuts, scratches and skin with dermatitis or dryness.    Follow-up after decolonization treatment  Possible approaches will vary depending on how your treatment goes. Your Ceylin Dreibelbis will instruct you on the follow-up best suited for you. Some options are:   Wait and see - if no further   boils occur within 6 months then it is probable that the strain of staph has been eliminated from you.    Continue intermittent body washes 1-2 times per week with 2% aqueous chlorhexidine soap preparation or similar   Future antibiotic use  The best preventative approach to avoiding future problems may be to avoid use of antibiotics unless there is a strong indication. Antibiotics are often prescribed for minor infections or for respiratory infections that are mostly due to viruses. It is in your best interest to ride these infections out rather than taking antibiotics. Taking antibiotics alters your natural bacterial flora on your skin and in your gut. This may reduce your resistance against acquiring a resistant bacterial strain such as MRSA).   Important note: if you do become very ill with possible infection and require hospital review, it is important for you to remind your medical care providers that you have been colonized with MRSA in the past as they may have to use antibiotics that are active against the strain that you had previously.   References Wiese-Posselt et al. Clin Inf Dis 2007:44:e88  CDC:  http://www.cdc.gov/ncidod/dhqp/ar_mrsa_ca.html 

## 2013-10-05 ENCOUNTER — Encounter (HOSPITAL_COMMUNITY): Payer: Self-pay | Admitting: Emergency Medicine

## 2013-10-09 ENCOUNTER — Other Ambulatory Visit: Payer: Self-pay | Admitting: *Deleted

## 2013-10-09 ENCOUNTER — Other Ambulatory Visit: Payer: Self-pay | Admitting: Internal Medicine

## 2013-10-09 DIAGNOSIS — I10 Essential (primary) hypertension: Secondary | ICD-10-CM

## 2013-10-09 DIAGNOSIS — B20 Human immunodeficiency virus [HIV] disease: Secondary | ICD-10-CM

## 2013-10-09 MED ORDER — EMTRICITABINE-TENOFOVIR DF 200-300 MG PO TABS: 1.0000 | ORAL_TABLET | Freq: Every morning | ORAL | Status: DC

## 2013-10-09 MED ORDER — AMLODIPINE BESYLATE 10 MG PO TABS: 10.0000 mg | ORAL_TABLET | Freq: Every morning | ORAL | Status: DC

## 2013-10-09 MED ORDER — DARUNAVIR ETHANOLATE 800 MG PO TABS: 800.0000 mg | ORAL_TABLET | Freq: Every day | ORAL | Status: DC

## 2013-10-09 MED ORDER — RITONAVIR 100 MG PO CAPS: 100.0000 mg | ORAL_CAPSULE | Freq: Every day | ORAL | Status: DC

## 2013-12-17 ENCOUNTER — Other Ambulatory Visit: Payer: Self-pay

## 2013-12-31 ENCOUNTER — Ambulatory Visit: Payer: Self-pay | Admitting: Internal Medicine

## 2014-01-07 ENCOUNTER — Other Ambulatory Visit: Payer: Medicaid Other

## 2014-01-07 DIAGNOSIS — B20 Human immunodeficiency virus [HIV] disease: Secondary | ICD-10-CM

## 2014-01-07 LAB — COMPLETE METABOLIC PANEL WITH GFR
ALK PHOS: 49 U/L (ref 39–117)
ALT: 11 U/L (ref 0–53)
AST: 20 U/L (ref 0–37)
Albumin: 4.6 g/dL (ref 3.5–5.2)
BILIRUBIN TOTAL: 0.5 mg/dL (ref 0.2–1.2)
BUN: 12 mg/dL (ref 6–23)
CO2: 24 mEq/L (ref 19–32)
Calcium: 9.9 mg/dL (ref 8.4–10.5)
Chloride: 100 mEq/L (ref 96–112)
Creat: 1.1 mg/dL (ref 0.50–1.35)
GFR, Est African American: >89 mL/min
GFR, Est Non African American: 87 mL/min
Glucose, Bld: 84 mg/dL (ref 70–99)
Potassium: 4.3 mEq/L (ref 3.5–5.3)
SODIUM: 135 meq/L (ref 135–145)
Total Protein: 7.7 g/dL (ref 6.0–8.3)

## 2014-01-08 LAB — CBC WITH DIFFERENTIAL/PLATELET
Basophils Absolute: 0.1 10*3/uL (ref 0.0–0.1)
Basophils Relative: 2 % — ABNORMAL HIGH (ref 0–1)
EOS ABS: 0.2 10*3/uL (ref 0.0–0.7)
Eosinophils Relative: 4 % (ref 0–5)
HEMATOCRIT: 43.1 % (ref 39.0–52.0)
HEMOGLOBIN: 14.3 g/dL (ref 13.0–17.0)
LYMPHS ABS: 2.1 10*3/uL (ref 0.7–4.0)
Lymphocytes Relative: 37 % (ref 12–46)
MCH: 31.5 pg (ref 26.0–34.0)
MCHC: 33.2 g/dL (ref 30.0–36.0)
MCV: 94.9 fL (ref 78.0–100.0)
MONOS PCT: 8 % (ref 3–12)
Monocytes Absolute: 0.4 10*3/uL (ref 0.1–1.0)
NEUTROS ABS: 2.7 10*3/uL (ref 1.7–7.7)
Neutrophils Relative %: 49 % (ref 43–77)
Platelets: 367 10*3/uL (ref 150–400)
RBC: 4.54 MIL/uL (ref 4.22–5.81)
RDW: 12.3 % (ref 11.5–15.5)
WBC: 5.6 10*3/uL (ref 4.0–10.5)

## 2014-01-08 LAB — T-HELPER CELL (CD4) - (RCID CLINIC ONLY)
CD4 T CELL HELPER: 45 % (ref 33–55)
CD4 T Cell Abs: 950 /uL (ref 400–2700)

## 2014-01-09 LAB — HIV-1 RNA QUANT-NO REFLEX-BLD: HIV 1 RNA Quant: <20 copies/mL (ref <20)

## 2014-01-28 ENCOUNTER — Ambulatory Visit: Payer: Self-pay | Admitting: Internal Medicine

## 2014-03-19 ENCOUNTER — Encounter (HOSPITAL_COMMUNITY): Payer: Self-pay | Admitting: Emergency Medicine

## 2014-03-19 ENCOUNTER — Emergency Department (HOSPITAL_COMMUNITY): Payer: Medicaid Other

## 2014-03-19 ENCOUNTER — Emergency Department (HOSPITAL_COMMUNITY)
Admission: EM | Admit: 2014-03-19 | Discharge: 2014-03-19 | Disposition: A | Discharge status: Home / Self Care | Payer: Medicaid Other | Attending: Emergency Medicine | Admitting: Emergency Medicine

## 2014-03-19 DIAGNOSIS — Z21 Asymptomatic human immunodeficiency virus [HIV] infection status: Secondary | ICD-10-CM | POA: Insufficient documentation

## 2014-03-19 DIAGNOSIS — M25461 Effusion, right knee: Secondary | ICD-10-CM | POA: Insufficient documentation

## 2014-03-19 DIAGNOSIS — M25561 Pain in right knee: Secondary | ICD-10-CM | POA: Diagnosis present

## 2014-03-19 DIAGNOSIS — Z79899 Other long term (current) drug therapy: Secondary | ICD-10-CM | POA: Diagnosis not present

## 2014-03-19 DIAGNOSIS — Z8781 Personal history of (healed) traumatic fracture: Secondary | ICD-10-CM | POA: Insufficient documentation

## 2014-03-19 DIAGNOSIS — Z9889 Other specified postprocedural states: Secondary | ICD-10-CM | POA: Insufficient documentation

## 2014-03-19 DIAGNOSIS — I10 Essential (primary) hypertension: Secondary | ICD-10-CM | POA: Diagnosis not present

## 2014-03-19 DIAGNOSIS — M25462 Effusion, left knee: Secondary | ICD-10-CM

## 2014-03-19 DIAGNOSIS — Z720 Tobacco use: Secondary | ICD-10-CM | POA: Insufficient documentation

## 2014-03-19 MED ORDER — HYDROCODONE-ACETAMINOPHEN 5-325 MG PO TABS: 2.0000 | ORAL_TABLET | ORAL | Status: DC | PRN

## 2014-03-19 MED ORDER — IBUPROFEN 800 MG PO TABS: 800.0000 mg | ORAL_TABLET | Freq: Three times a day (TID) | ORAL | Status: DC
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
## 2014-03-19 NOTE — Discharge Instructions (Signed)

## 2014-03-19 NOTE — ED Notes (Signed)
Patient transported to X-ray 
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
## 2014-03-19 NOTE — ED Notes (Signed)
Patient back in room following X-ray.

## 2014-04-22 ENCOUNTER — Other Ambulatory Visit: Payer: Self-pay | Admitting: Internal Medicine

## 2014-04-22 ENCOUNTER — Telehealth: Payer: Self-pay | Admitting: *Deleted

## 2014-04-22 DIAGNOSIS — B20 Human immunodeficiency virus [HIV] disease: Secondary | ICD-10-CM

## 2014-04-22 DIAGNOSIS — I10 Essential (primary) hypertension: Secondary | ICD-10-CM

## 2014-04-23 NOTE — Telephone Encounter (Signed)
Patient requested refill of medications.  Per chart review, patient is overdue for an office visit.  RN authorized 30 day refill of ARV (pt states he has a PCP, "but only for my pain pills"), called patient to schedule an appointment.  He will come 1/19 where he can address ARV refills and HTN management.  Pt agreed.  Andree CossHowell, Michelle M, RN

## 2014-04-28 ENCOUNTER — Encounter: Payer: Self-pay | Admitting: Internal Medicine

## 2014-04-28 ENCOUNTER — Ambulatory Visit (INDEPENDENT_AMBULATORY_CARE_PROVIDER_SITE_OTHER): Payer: Medicaid Other | Admitting: Internal Medicine

## 2014-04-28 VITALS — BP 151/76 | HR 92 | Temp 98.1°F | Wt 147.0 lb

## 2014-04-28 DIAGNOSIS — B20 Human immunodeficiency virus [HIV] disease: Secondary | ICD-10-CM

## 2014-04-28 DIAGNOSIS — Z23 Encounter for immunization: Secondary | ICD-10-CM

## 2014-04-28 NOTE — Assessment & Plan Note (Signed)
He has been doing well though was off for one week. We'll check his labs today and if there are any issues he will be called.  I will consider changing him to [facility] next visit if Medicaid is covering it at that time and there are no issues with today's labs.

## 2014-04-28 NOTE — Progress Notes (Signed)
  Subjective:    Patient ID: Pam Drownlbert J Granderson, male    DOB: 1980/05/19, 34 y.o.   MRN: [id number]  HPI For routine followup of his HIV.  He continues on Prezista, Norvir and Truvada.  Was jailed since his last visit and did missed some medications.  His last CD4 count was 950 with undetectable viral load about 4 months ago.   Review of Systems  Constitutional: Negative for appetite change and fatigue.  HENT: Negative for sore throat and trouble swallowing.   Eyes: Negative for visual disturbance.  Respiratory: Negative for shortness of breath.   Cardiovascular: Negative for leg swelling.  Gastrointestinal: Negative for nausea, abdominal pain and diarrhea.  Musculoskeletal: Negative for myalgias and arthralgias.  Skin: Negative for rash.  Neurological: Negative for dizziness, light-headedness and headaches.  Hematological: Negative for adenopathy.  Psychiatric/Behavioral: Negative for dysphoric mood.       Objective:   Physical Exam  Constitutional: He appears well-developed and well-nourished. No distress.  HENT:  Mouth/Throat: No oropharyngeal exudate.  Eyes: Right eye exhibits no discharge. Left eye exhibits no discharge. No scleral icterus.  Cardiovascular: Normal rate, regular rhythm and normal heart sounds.   No murmur heard. Pulmonary/Chest: Effort normal and breath sounds normal. No respiratory distress. He has no wheezes.  Skin: Skin is warm and dry. No rash noted.  Open, lanced area with pus on left elbow  Psychiatric: He has a normal mood and affect. His behavior is normal.          Assessment & Plan:

## 2014-04-29 LAB — T-HELPER CELL (CD4) - (RCID CLINIC ONLY)
CD4 % Helper T Cell: 41 % (ref 33–55)
CD4 T Cell Abs: 580 /uL (ref 400–2700)

## 2014-04-29 LAB — HIV-1 RNA QUANT-NO REFLEX-BLD: HIV 1 RNA Quant: <20 copies/mL (ref <20)

## 2014-04-30 ENCOUNTER — Other Ambulatory Visit: Payer: Self-pay | Admitting: *Deleted

## 2014-04-30 ENCOUNTER — Telehealth: Payer: Self-pay | Admitting: *Deleted

## 2014-04-30 NOTE — Telephone Encounter (Signed)
Patient would like refill of HTN medication, states he does not have a PCP.  Please advise. Andree CossHowell, Michelle M, RN

## 2014-05-02 NOTE — Telephone Encounter (Signed)
Please refill.

## 2014-05-04 ENCOUNTER — Other Ambulatory Visit: Payer: Self-pay | Admitting: *Deleted

## 2014-05-04 DIAGNOSIS — I10 Essential (primary) hypertension: Secondary | ICD-10-CM

## 2014-05-04 MED ORDER — AMLODIPINE BESYLATE 10 MG PO TABS: 10.0000 mg | ORAL_TABLET | Freq: Every morning | ORAL | Status: DC

## 2014-05-29 ENCOUNTER — Other Ambulatory Visit: Payer: Self-pay | Admitting: Internal Medicine

## 2014-05-29 DIAGNOSIS — B20 Human immunodeficiency virus [HIV] disease: Secondary | ICD-10-CM

## 2014-08-11 ENCOUNTER — Encounter: Payer: Self-pay | Admitting: Internal Medicine

## 2014-08-11 ENCOUNTER — Ambulatory Visit (INDEPENDENT_AMBULATORY_CARE_PROVIDER_SITE_OTHER): Payer: Medicaid Other | Admitting: Internal Medicine

## 2014-08-11 VITALS — BP 142/93 | HR 73 | Temp 98.2°F | Ht 67.0 in | Wt 137.0 lb

## 2014-08-11 DIAGNOSIS — Z79899 Other long term (current) drug therapy: Secondary | ICD-10-CM | POA: Insufficient documentation

## 2014-08-11 DIAGNOSIS — B20 Human immunodeficiency virus [HIV] disease: Secondary | ICD-10-CM

## 2014-08-11 DIAGNOSIS — Z113 Encounter for screening for infections with a predominantly sexual mode of transmission: Secondary | ICD-10-CM | POA: Diagnosis not present

## 2014-08-11 DIAGNOSIS — Z202 Contact with and (suspected) exposure to infections with a predominantly sexual mode of transmission: Secondary | ICD-10-CM | POA: Diagnosis not present

## 2014-08-11 LAB — LIPID PANEL
CHOL/HDL RATIO: 3.9 ratio
Cholesterol: 182 mg/dL (ref 0–200)
HDL: 47 mg/dL (ref >=40)
LDL Cholesterol: 124 mg/dL — ABNORMAL HIGH (ref 0–99)
TRIGLYCERIDES: 56 mg/dL (ref <150)
VLDL: 11 mg/dL (ref 0–40)

## 2014-08-11 LAB — COMPLETE METABOLIC PANEL WITH GFR
ALT: 10 U/L (ref 0–53)
AST: 15 U/L (ref 0–37)
Albumin: 4.7 g/dL (ref 3.5–5.2)
Alkaline Phosphatase: 43 U/L (ref 39–117)
BUN: 19 mg/dL (ref 6–23)
CO2: 25 mEq/L (ref 19–32)
CREATININE: 0.97 mg/dL (ref 0.50–1.35)
Calcium: 9.7 mg/dL (ref 8.4–10.5)
Chloride: 103 mEq/L (ref 96–112)
Glucose, Bld: 96 mg/dL (ref 70–99)
Potassium: 4.1 mEq/L (ref 3.5–5.3)
Sodium: 135 mEq/L (ref 135–145)
Total Bilirubin: 0.5 mg/dL (ref 0.2–1.2)
Total Protein: 7.8 g/dL (ref 6.0–8.3)

## 2014-08-11 LAB — CBC WITH DIFFERENTIAL/PLATELET
Basophils Absolute: 0 10*3/uL (ref 0.0–0.1)
Basophils Relative: 1 % (ref 0–1)
Eosinophils Absolute: 0.1 10*3/uL (ref 0.0–0.7)
Eosinophils Relative: 2 % (ref 0–5)
HEMATOCRIT: 43.5 % (ref 39.0–52.0)
HEMOGLOBIN: 14.3 g/dL (ref 13.0–17.0)
LYMPHS ABS: 1.5 10*3/uL (ref 0.7–4.0)
Lymphocytes Relative: 37 % (ref 12–46)
MCH: 31.6 pg (ref 26.0–34.0)
MCHC: 32.9 g/dL (ref 30.0–36.0)
MCV: 96 fL (ref 78.0–100.0)
MONO ABS: 0.3 10*3/uL (ref 0.1–1.0)
MONOS PCT: 8 % (ref 3–12)
MPV: 9.2 fL (ref 8.6–12.4)
NEUTROS PCT: 52 % (ref 43–77)
Neutro Abs: 2.1 10*3/uL (ref 1.7–7.7)
Platelets: 400 10*3/uL (ref 150–400)
RBC: 4.53 MIL/uL (ref 4.22–5.81)
RDW: 12.5 % (ref 11.5–15.5)
WBC: 4.1 10*3/uL (ref 4.0–10.5)

## 2014-08-11 MED ORDER — ELVITEG-COBIC-EMTRICIT-TENOFAF 150-150-200-10 MG PO TABS: 1.0000 | ORAL_TABLET | Freq: Every day | ORAL | Status: DC

## 2014-08-11 NOTE — Assessment & Plan Note (Signed)
I discussed his regimen and he is interested in a one pill a day regimen.  I will change to [facility] since he is compliant.  RTC 3 months.  Offered condoms.

## 2014-08-11 NOTE — Assessment & Plan Note (Signed)
I will check his RPR now and next visit.  Discussed risks of oral sex and other sexual encounters for STIs.

## 2014-08-11 NOTE — Progress Notes (Signed)
  Subjective:    Patient ID: [name], male    DOB: [birth year], 34 y.o.   MRN: [id number]  HPI For routine followup of his HIV.  He continues on Prezista, Norvir and Truvada.  Has been taking without missed doses.  His last CD4 count was 580 with undetectable viral load about 4 months ago.  No new issues.  Is sexually active with oral sex and recently exposed to syphilis and treated with doxycycline.  Was told at one time he was penicillin allergic but does not remember any events and does not feel he has a penicillin allergy.  Did not develop any oral lesions.     Review of Systems  Constitutional: Negative for appetite change and fatigue.  HENT: Negative for sore throat and trouble swallowing.   Gastrointestinal: Negative for nausea and diarrhea.  Genitourinary: Negative for discharge, genital sores and testicular pain.  Skin: Negative for rash.  Neurological: Negative for dizziness, light-headedness and headaches.  Hematological: Negative for adenopathy.  Psychiatric/Behavioral: Negative for dysphoric mood.       Objective:   Physical Exam  Constitutional: He appears well-developed and well-nourished. No distress.  HENT:  Mouth/Throat: No oropharyngeal exudate.  Eyes: Right eye exhibits no discharge. Left eye exhibits no discharge. No scleral icterus.  Cardiovascular: Normal rate, regular rhythm and normal heart sounds.   No murmur heard. Pulmonary/Chest: Effort normal and breath sounds normal. No respiratory distress. He has no wheezes.  Skin: Skin is warm and dry. No rash noted.          Assessment & Plan:

## 2014-08-12 LAB — HIV-1 RNA QUANT-NO REFLEX-BLD
HIV 1 RNA Quant: <20 copies/mL (ref <20)
HIV-1 RNA Quant, Log: <1.3 {Log} (ref <1.30)

## 2014-08-12 LAB — RPR TITER: RPR Titer: 1:32 {titer} — AB

## 2014-08-12 LAB — T-HELPER CELL (CD4) - (RCID CLINIC ONLY)
CD4 % Helper T Cell: 39 % (ref 33–55)
CD4 T Cell Abs: 610 /uL (ref 400–2700)

## 2014-08-12 LAB — RPR: RPR Ser Ql: REACTIVE — AB

## 2014-08-12 LAB — FLUORESCENT TREPONEMAL AB(FTA)-IGG-BLD: Fluorescent Treponemal ABS: REACTIVE — AB

## 2014-10-29 ENCOUNTER — Other Ambulatory Visit: Payer: Self-pay

## 2014-11-03 ENCOUNTER — Other Ambulatory Visit: Payer: Self-pay | Admitting: Infectious Diseases

## 2014-11-11 ENCOUNTER — Telehealth: Payer: Self-pay | Admitting: *Deleted

## 2014-11-11 NOTE — Telephone Encounter (Signed)
Left message reminding of appointment tomorrow. Andree Coss, RN

## 2014-11-12 ENCOUNTER — Ambulatory Visit: Payer: Self-pay | Admitting: Internal Medicine

## 2015-01-21 ENCOUNTER — Other Ambulatory Visit: Payer: Self-pay | Admitting: Internal Medicine
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
## 2015-03-16 IMAGING — RF DG CLAVICLE*R*
1 series · 3 of 3 positions shown · non-contrast
Comparison: Chest x-ray 08/18/2012

CLINICAL DATA: ORIF right clavicle.

RIGHT CLAVICLE - 2+ VIEWS

[Series 1: run · 3 of 3 slices shown]
[im 1/3]
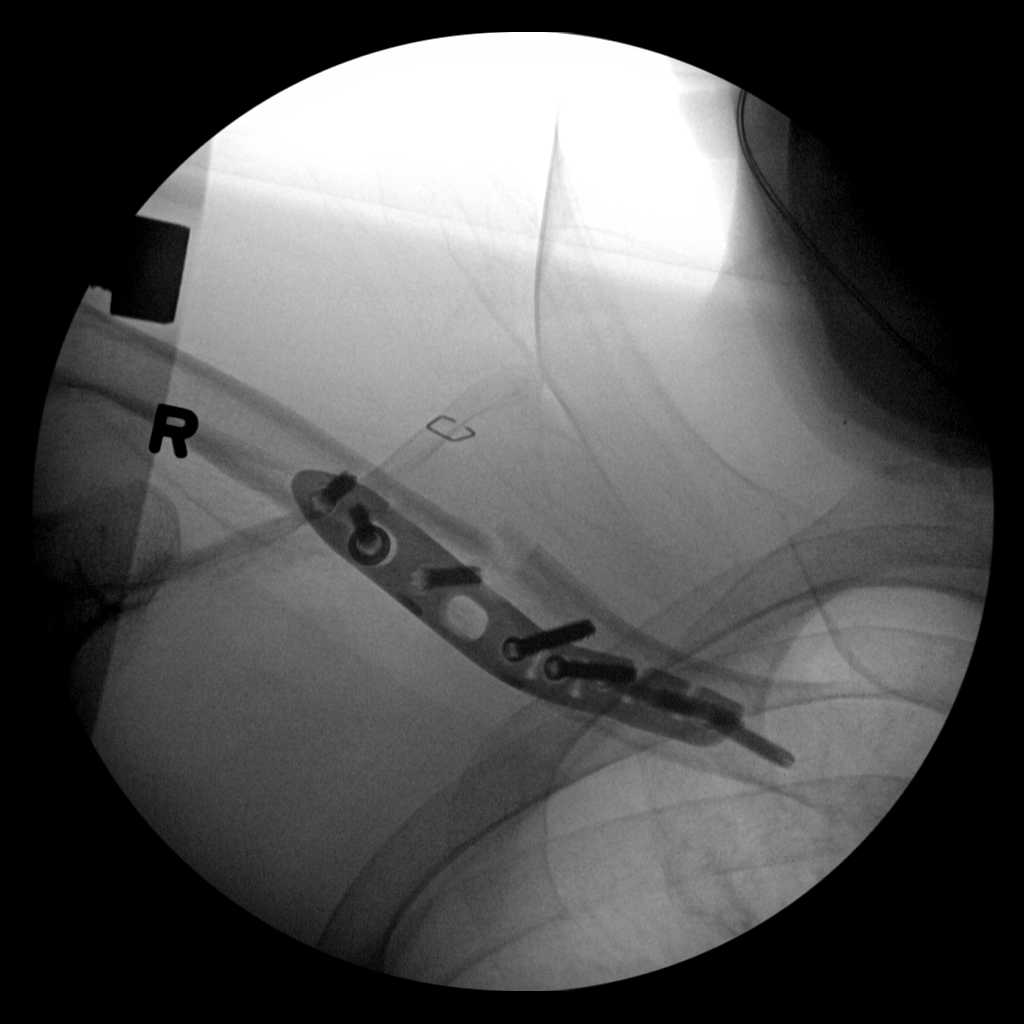
[im 2/3]
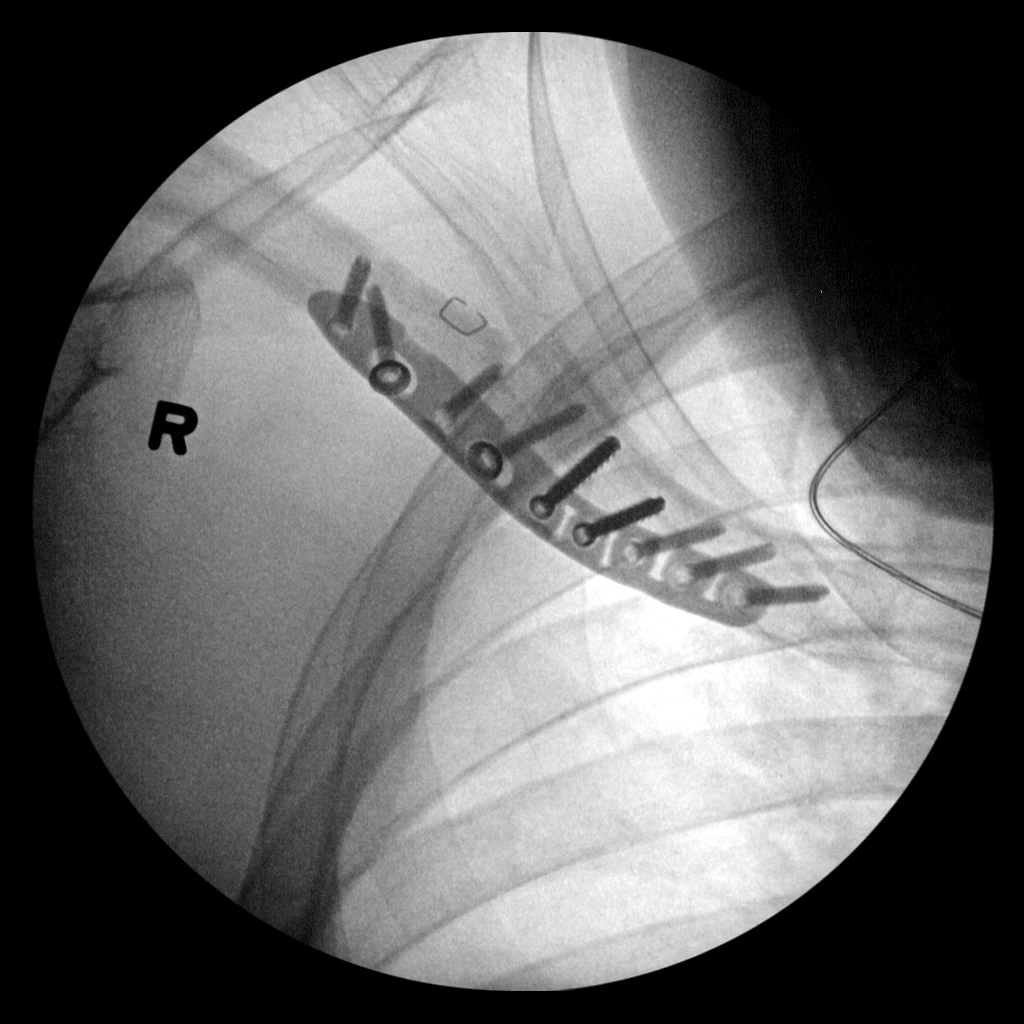
[im 3/3]
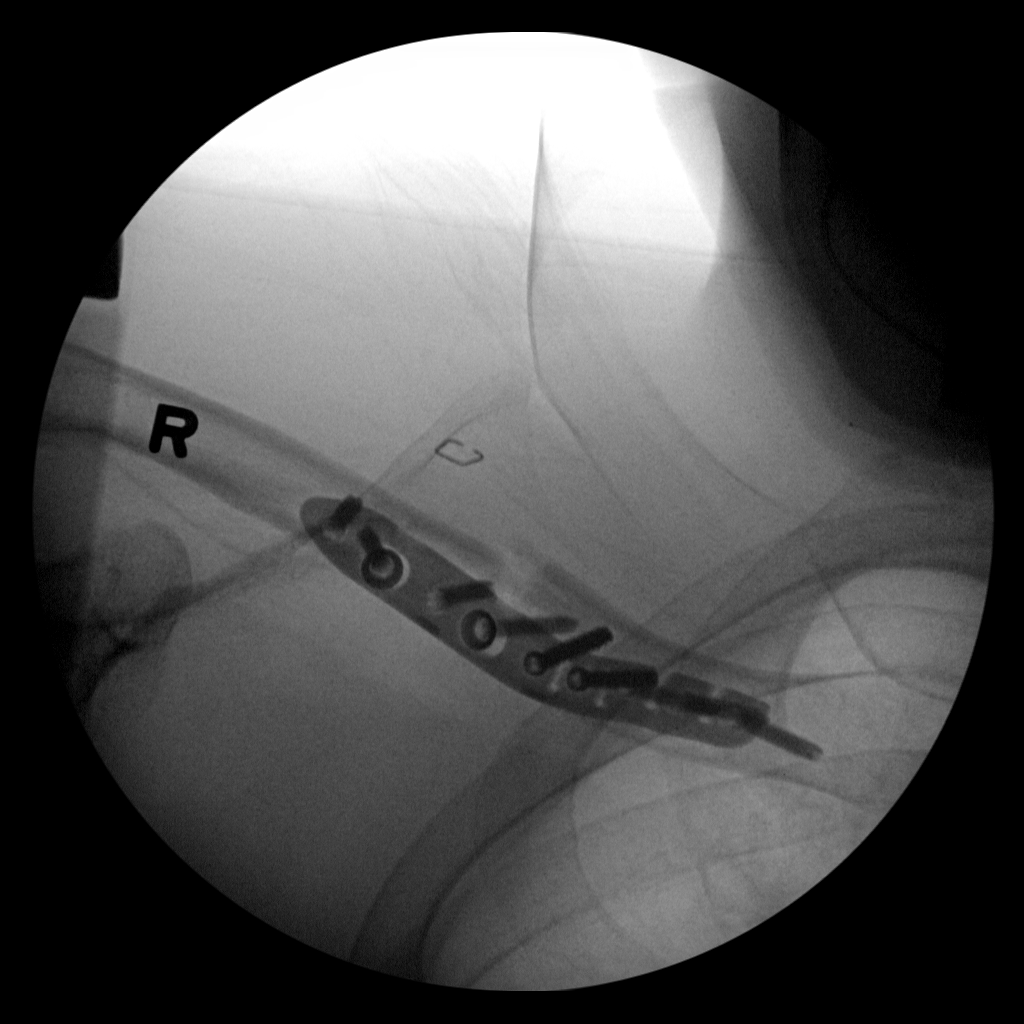

[3 of 3 positions shown; findings below may reference images not displayed]

FINDINGS: Plate and screw fixation across the mid right clavicle
fracture.  Near anatomic alignment.  No complicating feature.
IMPRESSION: Plate and screw fixation of the mid clavicle fracture.

## 2015-03-30 ENCOUNTER — Other Ambulatory Visit: Payer: Self-pay | Admitting: Infectious Diseases

## 2015-03-30 DIAGNOSIS — I10 Essential (primary) hypertension: Secondary | ICD-10-CM

## 2015-04-13 ENCOUNTER — Ambulatory Visit (INDEPENDENT_AMBULATORY_CARE_PROVIDER_SITE_OTHER): Payer: Medicaid Other | Admitting: Internal Medicine

## 2015-04-13 ENCOUNTER — Other Ambulatory Visit (HOSPITAL_COMMUNITY)
Admission: RE | Admit: 2015-04-13 | Discharge: 2015-04-13 | Disposition: A | Discharge status: Home / Self Care | Payer: Medicaid Other | Source: Ambulatory Visit | Attending: Internal Medicine | Admitting: Internal Medicine

## 2015-04-13 ENCOUNTER — Encounter: Payer: Self-pay | Admitting: Internal Medicine

## 2015-04-13 VITALS — BP 141/82 | HR 84 | Temp 99.1°F | Ht 67.0 in | Wt 146.1 lb

## 2015-04-13 DIAGNOSIS — Z202 Contact with and (suspected) exposure to infections with a predominantly sexual mode of transmission: Secondary | ICD-10-CM

## 2015-04-13 DIAGNOSIS — Z79899 Other long term (current) drug therapy: Secondary | ICD-10-CM

## 2015-04-13 DIAGNOSIS — B20 Human immunodeficiency virus [HIV] disease: Secondary | ICD-10-CM | POA: Diagnosis not present

## 2015-04-13 DIAGNOSIS — Z113 Encounter for screening for infections with a predominantly sexual mode of transmission: Secondary | ICD-10-CM | POA: Diagnosis present

## 2015-04-13 LAB — LIPID PANEL
CHOL/HDL RATIO: 4.2 ratio (ref <=5.0)
Cholesterol: 204 mg/dL — ABNORMAL HIGH (ref 125–200)
HDL: 49 mg/dL (ref >=40)
LDL CALC: 138 mg/dL — AB (ref <130)
Triglycerides: 86 mg/dL (ref <150)
VLDL: 17 mg/dL (ref <30)

## 2015-04-13 LAB — CBC WITH DIFFERENTIAL/PLATELET
BASOS PCT: 1 % (ref 0–1)
Basophils Absolute: 0.1 10*3/uL (ref 0.0–0.1)
EOS PCT: 4 % (ref 0–5)
Eosinophils Absolute: 0.2 10*3/uL (ref 0.0–0.7)
HCT: 43.1 % (ref 39.0–52.0)
Hemoglobin: 13.9 g/dL (ref 13.0–17.0)
Lymphocytes Relative: 44 % (ref 12–46)
Lymphs Abs: 2.3 10*3/uL (ref 0.7–4.0)
MCH: 30.2 pg (ref 26.0–34.0)
MCHC: 32.3 g/dL (ref 30.0–36.0)
MCV: 93.7 fL (ref 78.0–100.0)
MPV: 9.3 fL (ref 8.6–12.4)
Monocytes Absolute: 0.4 10*3/uL (ref 0.1–1.0)
Monocytes Relative: 8 % (ref 3–12)
Neutro Abs: 2.2 10*3/uL (ref 1.7–7.7)
Neutrophils Relative %: 43 % (ref 43–77)
Platelets: 471 10*3/uL — ABNORMAL HIGH (ref 150–400)
RBC: 4.6 MIL/uL (ref 4.22–5.81)
RDW: 12.3 % (ref 11.5–15.5)
WBC: 5.2 10*3/uL (ref 4.0–10.5)

## 2015-04-13 LAB — COMPLETE METABOLIC PANEL WITH GFR
ALT: 10 U/L (ref 9–46)
AST: 15 U/L (ref 10–40)
Albumin: 5 g/dL (ref 3.6–5.1)
Alkaline Phosphatase: 54 U/L (ref 40–115)
BUN: 21 mg/dL (ref 7–25)
CO2: 23 mmol/L (ref 20–31)
Calcium: 9.7 mg/dL (ref 8.6–10.3)
Chloride: 104 mmol/L (ref 98–110)
Creat: 1.29 mg/dL (ref 0.60–1.35)
GFR, EST NON AFRICAN AMERICAN: 71 mL/min (ref >=60)
GFR, Est African American: 82 mL/min (ref >=60)
GLUCOSE: 81 mg/dL (ref 65–99)
POTASSIUM: 4.3 mmol/L (ref 3.5–5.3)
SODIUM: 138 mmol/L (ref 135–146)
Total Bilirubin: 0.3 mg/dL (ref 0.2–1.2)
Total Protein: 7.7 g/dL (ref 6.1–8.1)

## 2015-04-13 NOTE — Assessment & Plan Note (Signed)
Will recheck his titer today.  
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
## 2015-04-13 NOTE — Assessment & Plan Note (Signed)
Doing well.  Labs today and rtc 6 months unless concerns.  

## 2015-04-15 LAB — URINE CYTOLOGY ANCILLARY ONLY
CHLAMYDIA, DNA PROBE: NEGATIVE
Neisseria Gonorrhea: NEGATIVE

## 2015-04-15 LAB — T-HELPER CELL (CD4) - (RCID CLINIC ONLY)
CD4 T CELL HELPER: 48 % (ref 33–55)
CD4 T Cell Abs: 1110 /uL (ref 400–2700)

## 2015-04-15 LAB — RPR

## 2015-04-16 LAB — HIV-1 RNA QUANT-NO REFLEX-BLD: HIV 1 RNA Quant: <20 copies/mL (ref <20)

## 2015-07-06 ENCOUNTER — Other Ambulatory Visit: Payer: Self-pay | Admitting: Internal Medicine

## 2015-07-27 ENCOUNTER — Encounter: Payer: Self-pay | Admitting: Internal Medicine

## 2015-07-27 ENCOUNTER — Ambulatory Visit (INDEPENDENT_AMBULATORY_CARE_PROVIDER_SITE_OTHER): Payer: Medicaid Other | Admitting: Internal Medicine

## 2015-07-27 VITALS — BP 166/92 | HR 70 | Temp 97.7°F | Ht 68.0 in | Wt 149.8 lb

## 2015-07-27 DIAGNOSIS — S12100D Unspecified displaced fracture of second cervical vertebra, subsequent encounter for fracture with routine healing: Secondary | ICD-10-CM

## 2015-07-27 DIAGNOSIS — M79661 Pain in right lower leg: Secondary | ICD-10-CM

## 2015-07-27 DIAGNOSIS — R32 Unspecified urinary incontinence: Secondary | ICD-10-CM | POA: Diagnosis not present

## 2015-07-27 DIAGNOSIS — G8929 Other chronic pain: Secondary | ICD-10-CM

## 2015-07-27 DIAGNOSIS — I10 Essential (primary) hypertension: Secondary | ICD-10-CM | POA: Diagnosis not present

## 2015-07-27 DIAGNOSIS — S82141D Displaced bicondylar fracture of right tibia, subsequent encounter for closed fracture with routine healing: Secondary | ICD-10-CM | POA: Diagnosis not present

## 2015-07-27 MED ORDER — MELOXICAM 7.5 MG PO TABS: 7.5000 mg | ORAL_TABLET | Freq: Every day | ORAL | Status: DC

## 2015-07-27 MED ORDER — AMLODIPINE BESYLATE 10 MG PO TABS
10.0000 mg | ORAL_TABLET | Freq: Every day | ORAL | Status: DC
Start: 2015-07-27 — End: 2015-11-15

## 2015-07-29 ENCOUNTER — Encounter: Payer: Self-pay | Admitting: Internal Medicine

## 2015-07-29 NOTE — Assessment & Plan Note (Addendum)
BP Readings from Last 3 Encounters:  07/27/15 166/92  04/13/15 141/82  08/11/14 142/93    Lab Results  Component Value Date   NA 138 04/13/2015   K 4.3 04/13/2015   CREATININE 1.29 04/13/2015    Assessment: Blood pressure control:  uncontrolled Progress toward BP goal:   not at goal Comments: previously on amlodipine 10mg , but states he has been out of medication for the last 1-2 months so currently without medication  Plan: Medications:  Restart amlodipine 10mg  Other plans: RTC 6 weeks for re-check
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
## 2015-07-30 ENCOUNTER — Telehealth: Payer: Self-pay | Admitting: Licensed Clinical Social Worker

## 2015-07-30 ENCOUNTER — Encounter: Payer: Self-pay | Admitting: Internal Medicine

## 2015-07-30 DIAGNOSIS — R32 Unspecified urinary incontinence: Secondary | ICD-10-CM | POA: Insufficient documentation

## 2015-07-30 NOTE — Addendum Note (Signed)
Addended by: Doneen PoissonKLIMA, Camylle Whicker D on: 07/30/2015 11:11 PM   Modules accepted: Level of Service

## 2015-07-30 NOTE — Telephone Encounter (Signed)
CSW discussed case with Dr. Kyung RuddKennedy and returned call to Mr. Manson PasseyBrown.  [facility]  [facility].  CSW notified [facility]Brown [facility] has [facility] and faxed this morning.  CSW confirmed pt's has mailing address noted on chart and residence noted on PCS form.  Pt informed DME forms for adult diapers were forwarded to front office for insurance completion.  Mr. Manson PasseyBrown denies add'l social work needs at this time.

## 2015-07-30 NOTE — Progress Notes (Signed)
Case discussed with Dr. Kennedy at the time of the visit.  We reviewed the resident's history and exam and pertinent patient test results.  I agree with the assessment, diagnosis and plan of care documented in the resident's note. 
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
## 2015-08-03 NOTE — Addendum Note (Signed)
Addended by: Neomia DearPOWERS, Mayukha Symmonds E on: 08/03/2015 07:57 AM   Modules accepted: Orders

## 2015-08-09 ENCOUNTER — Other Ambulatory Visit: Payer: Self-pay | Admitting: Internal Medicine

## 2015-08-09 DIAGNOSIS — R3981 Functional urinary incontinence: Secondary | ICD-10-CM

## 2015-09-09 ENCOUNTER — Ambulatory Visit: Payer: Self-pay | Admitting: Internal Medicine

## 2015-09-10 ENCOUNTER — Encounter: Payer: Self-pay | Admitting: Internal Medicine

## 2015-09-27 ENCOUNTER — Other Ambulatory Visit: Payer: Self-pay | Admitting: Internal Medicine

## 2015-10-07 ENCOUNTER — Ambulatory Visit: Payer: Self-pay | Admitting: Internal Medicine

## 2015-11-15 ENCOUNTER — Other Ambulatory Visit: Payer: Self-pay | Admitting: Internal Medicine

## 2015-11-15 DIAGNOSIS — I10 Essential (primary) hypertension: Secondary | ICD-10-CM

## 2015-11-17 ENCOUNTER — Other Ambulatory Visit: Payer: Self-pay | Admitting: Internal Medicine

## 2015-12-20 ENCOUNTER — Other Ambulatory Visit: Payer: Self-pay | Admitting: Internal Medicine

## 2016-01-25 ENCOUNTER — Ambulatory Visit (INDEPENDENT_AMBULATORY_CARE_PROVIDER_SITE_OTHER): Payer: Medicaid Other | Admitting: Internal Medicine

## 2016-01-25 ENCOUNTER — Encounter: Payer: Self-pay | Admitting: Internal Medicine

## 2016-01-25 VITALS — BP 163/108 | HR 113 | Temp 98.2°F | Ht 68.0 in | Wt 156.2 lb

## 2016-01-25 DIAGNOSIS — Z23 Encounter for immunization: Secondary | ICD-10-CM | POA: Diagnosis not present

## 2016-01-25 DIAGNOSIS — B20 Human immunodeficiency virus [HIV] disease: Secondary | ICD-10-CM

## 2016-01-25 DIAGNOSIS — Z113 Encounter for screening for infections with a predominantly sexual mode of transmission: Secondary | ICD-10-CM

## 2016-01-25 DIAGNOSIS — Z79899 Other long term (current) drug therapy: Secondary | ICD-10-CM

## 2016-01-25 NOTE — Assessment & Plan Note (Signed)
Doing well.  Labs today and rtc 6 months unless concerns.  

## 2016-01-25 NOTE — Assessment & Plan Note (Signed)
Screening today 

## 2016-01-25 NOTE — Progress Notes (Signed)
  Subjective:    Patient ID: [name], male    DOB: 04/29/1980, 35 y.o.   MRN: [id number]  HPI For routine followup of his HIV.    Doing well on Genvoya.  Has been taking without missed doses.  His last CD4 count was 1,110 with undetectable viral load about 9 months ago.  No new issues. Previously treated for syphilis. Has a new partner.  No penile discharge, no warts.     Review of Systems  Constitutional: Negative for appetite change and fatigue.  HENT: Negative for sore throat and trouble swallowing.   Gastrointestinal: Negative for diarrhea and nausea.  Skin: Negative for rash.  Neurological: Negative for headaches.       Objective:   Physical Exam  Constitutional: He appears well-developed and well-nourished. No distress.  HENT:  Mouth/Throat: No oropharyngeal exudate.  Eyes: Right eye exhibits no discharge. Left eye exhibits no discharge. No scleral icterus.  Cardiovascular: Normal rate, regular rhythm and normal heart sounds.   No murmur heard. Skin: Skin is warm and dry. No rash noted.          Assessment & Plan:

## 2016-01-26 LAB — CBC WITH DIFFERENTIAL/PLATELET
BASOS PCT: 1 %
Basophils Absolute: 58 cells/uL (ref 0–200)
EOS ABS: 58 {cells}/uL (ref 15–500)
Eosinophils Relative: 1 %
HEMATOCRIT: 45.8 % (ref 38.5–50.0)
HEMOGLOBIN: 14.9 g/dL (ref 13.2–17.1)
LYMPHS PCT: 29 %
Lymphs Abs: 1682 cells/uL (ref 850–3900)
MCH: 32.1 pg (ref 27.0–33.0)
MCHC: 32.5 g/dL (ref 32.0–36.0)
MCV: 98.7 fL (ref 80.0–100.0)
MONO ABS: 522 {cells}/uL (ref 200–950)
MPV: 9.5 fL (ref 7.5–12.5)
Monocytes Relative: 9 %
NEUTROS PCT: 60 %
Neutro Abs: 3480 cells/uL (ref 1500–7800)
Platelets: 341 10*3/uL (ref 140–400)
RBC: 4.64 MIL/uL (ref 4.20–5.80)
RDW: 12.4 % (ref 11.0–15.0)
WBC: 5.8 10*3/uL (ref 3.8–10.8)

## 2016-01-26 LAB — LIPID PANEL
Cholesterol: 196 mg/dL (ref 125–200)
HDL: 72 mg/dL (ref >=40)
LDL CALC: 98 mg/dL (ref <130)
Total CHOL/HDL Ratio: 2.7 Ratio (ref <=5.0)
Triglycerides: 131 mg/dL (ref <150)
VLDL: 26 mg/dL (ref <30)

## 2016-01-26 LAB — RPR: RPR: REACTIVE — AB

## 2016-01-26 LAB — COMPLETE METABOLIC PANEL WITH GFR
ALT: 21 U/L (ref 9–46)
AST: 32 U/L (ref 10–40)
Albumin: 4.3 g/dL (ref 3.6–5.1)
Alkaline Phosphatase: 65 U/L (ref 40–115)
BUN: 10 mg/dL (ref 7–25)
CHLORIDE: 102 mmol/L (ref 98–110)
CO2: 22 mmol/L (ref 20–31)
CREATININE: 1.04 mg/dL (ref 0.60–1.35)
Calcium: 9.5 mg/dL (ref 8.6–10.3)
GFR, Est African American: >89 mL/min (ref >=60)
GFR, Est Non African American: >89 mL/min (ref >=60)
Glucose, Bld: 78 mg/dL (ref 65–99)
Potassium: 4.4 mmol/L (ref 3.5–5.3)
SODIUM: 137 mmol/L (ref 135–146)
Total Bilirubin: 0.6 mg/dL (ref 0.2–1.2)
Total Protein: 7.7 g/dL (ref 6.1–8.1)

## 2016-01-26 LAB — FLUORESCENT TREPONEMAL AB(FTA)-IGG-BLD: Fluorescent Treponemal ABS: REACTIVE — AB

## 2016-01-26 LAB — T-HELPER CELL (CD4) - (RCID CLINIC ONLY)
CD4 T CELL ABS: 810 /uL (ref 400–2700)
CD4 T CELL HELPER: 45 % (ref 33–55)

## 2016-01-26 LAB — RPR TITER: RPR Titer: 1:1 {titer}

## 2016-01-27 LAB — HIV-1 RNA QUANT-NO REFLEX-BLD: HIV 1 RNA Quant: <20 copies/mL (ref <20)

## 2016-02-16 ENCOUNTER — Other Ambulatory Visit: Payer: Self-pay | Admitting: Internal Medicine

## 2016-02-16 DIAGNOSIS — I10 Essential (primary) hypertension: Secondary | ICD-10-CM

## 2016-02-16 DIAGNOSIS — B20 Human immunodeficiency virus [HIV] disease: Secondary | ICD-10-CM

## 2016-03-08 ENCOUNTER — Encounter: Payer: Self-pay | Admitting: Licensed Clinical Social Worker

## 2016-03-08 NOTE — Progress Notes (Unsigned)
ICD 10 Transition [facility]  Form initiated and placed in PCP's mailbox.

## 2016-03-09 ENCOUNTER — Telehealth: Payer: Self-pay | Admitting: Internal Medicine

## 2016-03-09 NOTE — Telephone Encounter (Signed)
APT. REMINDER CALL, LMTCB °

## 2016-03-10 ENCOUNTER — Ambulatory Visit: Payer: Self-pay

## 2016-03-10 ENCOUNTER — Encounter: Payer: Self-pay | Admitting: Internal Medicine

## 2016-03-17 ENCOUNTER — Other Ambulatory Visit: Payer: Self-pay | Admitting: Internal Medicine

## 2016-03-17 DIAGNOSIS — I10 Essential (primary) hypertension: Secondary | ICD-10-CM

## 2016-03-31 ENCOUNTER — Encounter: Payer: Self-pay | Admitting: Internal Medicine

## 2016-05-03 ENCOUNTER — Telehealth: Payer: Self-pay | Admitting: Internal Medicine

## 2016-05-03 NOTE — Telephone Encounter (Signed)
APT. REMINDER CALL, NO ANSWER, NO VOICEMAIL °

## 2016-05-04 ENCOUNTER — Encounter: Payer: Self-pay | Admitting: Internal Medicine

## 2016-05-12 ENCOUNTER — Other Ambulatory Visit: Payer: Self-pay | Admitting: Student in an Organized Health Care Education/Training Program

## 2016-06-15 ENCOUNTER — Other Ambulatory Visit: Payer: Self-pay | Admitting: Student in an Organized Health Care Education/Training Program

## 2016-06-15 ENCOUNTER — Other Ambulatory Visit: Payer: Self-pay | Admitting: Internal Medicine

## 2016-06-21 ENCOUNTER — Telehealth: Payer: Self-pay

## 2016-06-21 NOTE — Telephone Encounter (Signed)
Call to patient to attempt to schedule appointment and make him aware that medications will not be refilled until he has an appointment no answer left voicemail to call for appointment

## 2016-07-05 ENCOUNTER — Other Ambulatory Visit: Payer: Self-pay | Admitting: Student in an Organized Health Care Education/Training Program

## 2016-07-05 DIAGNOSIS — I10 Essential (primary) hypertension: Secondary | ICD-10-CM

## 2016-07-07 ENCOUNTER — Other Ambulatory Visit: Payer: Self-pay | Admitting: Internal Medicine

## 2016-07-07 ENCOUNTER — Other Ambulatory Visit: Payer: Self-pay | Admitting: Student in an Organized Health Care Education/Training Program

## 2016-07-07 DIAGNOSIS — I10 Essential (primary) hypertension: Secondary | ICD-10-CM

## 2016-07-10 ENCOUNTER — Encounter: Payer: Self-pay | Admitting: Internal Medicine

## 2016-07-10 NOTE — Telephone Encounter (Signed)
Call made to pharmacy as pt should have refills on file.   Per pharmacy-pt has refills on file, phone call complete.[name], [name]4/2/201811:02 AM

## 2016-07-15 ENCOUNTER — Other Ambulatory Visit: Payer: Self-pay | Admitting: Student in an Organized Health Care Education/Training Program

## 2016-07-15 DIAGNOSIS — I10 Essential (primary) hypertension: Secondary | ICD-10-CM

## 2016-07-25 ENCOUNTER — Ambulatory Visit: Payer: Self-pay | Admitting: Internal Medicine

## 2016-08-23 ENCOUNTER — Telehealth: Payer: Self-pay

## 2016-08-23 NOTE — Telephone Encounter (Signed)
Line is busy, unable to reach pt for appt reminder.

## 2016-08-24 ENCOUNTER — Encounter: Payer: Self-pay | Admitting: Internal Medicine

## 2016-08-28 ENCOUNTER — Other Ambulatory Visit: Payer: Self-pay | Admitting: Internal Medicine

## 2016-08-28 ENCOUNTER — Encounter: Payer: Self-pay | Admitting: Internal Medicine

## 2016-08-28 ENCOUNTER — Other Ambulatory Visit: Payer: Self-pay | Admitting: Student in an Organized Health Care Education/Training Program

## 2016-08-28 ENCOUNTER — Ambulatory Visit (INDEPENDENT_AMBULATORY_CARE_PROVIDER_SITE_OTHER): Payer: Medicaid Other | Admitting: Internal Medicine

## 2016-08-28 VITALS — BP 149/94 | HR 81 | Temp 97.6°F | Wt 167.0 lb

## 2016-08-28 DIAGNOSIS — Z720 Tobacco use: Secondary | ICD-10-CM

## 2016-08-28 DIAGNOSIS — Z23 Encounter for immunization: Secondary | ICD-10-CM | POA: Diagnosis not present

## 2016-08-28 DIAGNOSIS — Z7185 Encounter for immunization safety counseling: Secondary | ICD-10-CM

## 2016-08-28 DIAGNOSIS — Z7189 Other specified counseling: Secondary | ICD-10-CM

## 2016-08-28 DIAGNOSIS — I10 Essential (primary) hypertension: Secondary | ICD-10-CM

## 2016-08-28 DIAGNOSIS — B20 Human immunodeficiency virus [HIV] disease: Secondary | ICD-10-CM

## 2016-08-28 DIAGNOSIS — K5900 Constipation, unspecified: Secondary | ICD-10-CM | POA: Insufficient documentation

## 2016-08-28 DIAGNOSIS — L299 Pruritus, unspecified: Secondary | ICD-10-CM | POA: Diagnosis not present

## 2016-08-28 MED ORDER — HYDROXYZINE HCL 10 MG PO TABS: 10.0000 mg | ORAL_TABLET | Freq: Three times a day (TID) | ORAL | 3 refills | Status: DC | PRN

## 2016-08-28 MED ORDER — BISACODYL 5 MG PO TBEC: 10.0000 mg | DELAYED_RELEASE_TABLET | Freq: Every day | ORAL | 5 refills | Status: DC | PRN

## 2016-08-28 NOTE — Assessment & Plan Note (Signed)
Labs today and rtc 6 months unless concerns.  

## 2016-08-28 NOTE — Progress Notes (Signed)
   Subjective:    Patient ID: [name], male    DOB: July 30, 1980, 36 y.o.   MRN: [id number]  HPI Here for follow up of HIV. On Genvoya and denies anymissed doses.  No associated n/v/d.  Sexually active with condoms always.  No penile discharge, no warts. Complaint today of itching over the last month.  No associated rash.  Has been constipated as well and not taking any medicaiton now.     Review of Systems  Constitutional: Negative for fatigue.  Gastrointestinal: Negative for diarrhea.  Skin: Negative for rash.  Neurological: Negative for dizziness.       Objective:   Physical Exam  Constitutional: He appears well-developed and well-nourished. No distress.  HENT:  Mouth/Throat: No oropharyngeal exudate.  Eyes: No scleral icterus.  Cardiovascular: Normal rate, regular rhythm and normal heart sounds.   No murmur heard. Lymphadenopathy:    He has no cervical adenopathy.  Skin: No rash noted.   SH: continue to smoke       Assessment & Plan:

## 2016-08-28 NOTE — Assessment & Plan Note (Signed)
Menveo today 

## 2016-08-28 NOTE — Addendum Note (Signed)
Addended by: Lurlean LeydenPOOLE, TRAVIS F on: 08/28/2016 04:44 PM   Modules accepted: Orders

## 2016-08-28 NOTE — Assessment & Plan Note (Signed)
Discussed need to quit for long term health

## 2016-08-28 NOTE — Assessment & Plan Note (Signed)
No rash or anything associated with it.  Will give atarax.

## 2016-08-28 NOTE — Assessment & Plan Note (Signed)
Will try dulcolax

## 2016-08-29 LAB — T-HELPER CELL (CD4) - (RCID CLINIC ONLY)
CD4 T CELL ABS: 710 /uL (ref 400–2700)
CD4 T CELL HELPER: 40 % (ref 33–55)

## 2016-08-29 LAB — HEPATITIS B SURFACE ANTIBODY,QUALITATIVE: Hep B S Ab: NEGATIVE

## 2016-08-30 LAB — HIV-1 RNA QUANT-NO REFLEX-BLD
HIV 1 RNA QUANT: DETECTED {copies}/mL — AB
HIV-1 RNA Quant, Log: <1.3 Log copies/mL — AB

## 2016-09-07 ENCOUNTER — Other Ambulatory Visit: Payer: Self-pay | Admitting: Student in an Organized Health Care Education/Training Program

## 2016-09-07 DIAGNOSIS — I10 Essential (primary) hypertension: Secondary | ICD-10-CM

## 2016-09-29 ENCOUNTER — Encounter: Payer: Self-pay | Admitting: Internal Medicine

## 2016-12-25 ENCOUNTER — Other Ambulatory Visit: Payer: Self-pay | Admitting: Internal Medicine

## 2017-01-02 ENCOUNTER — Other Ambulatory Visit: Payer: Self-pay | Admitting: Internal Medicine

## 2017-01-02 DIAGNOSIS — I10 Essential (primary) hypertension: Secondary | ICD-10-CM

## 2017-01-03 NOTE — Telephone Encounter (Signed)
Amlodipine refill denied as pt needs an apppointment-but upon further review of chart, pt is no longer an [facility] patient.Gary Spittle Cassady9/26/20189:57 AM

## 2017-01-08 ENCOUNTER — Other Ambulatory Visit: Payer: Self-pay | Admitting: Internal Medicine

## 2017-01-08 DIAGNOSIS — I10 Essential (primary) hypertension: Secondary | ICD-10-CM

## 2017-02-28 ENCOUNTER — Ambulatory Visit (INDEPENDENT_AMBULATORY_CARE_PROVIDER_SITE_OTHER): Payer: Medicaid Other | Admitting: Internal Medicine

## 2017-02-28 ENCOUNTER — Encounter: Payer: Self-pay | Admitting: Internal Medicine

## 2017-02-28 VITALS — BP 147/89 | HR 73 | Temp 98.4°F | Ht 68.0 in | Wt 167.0 lb

## 2017-02-28 DIAGNOSIS — Z113 Encounter for screening for infections with a predominantly sexual mode of transmission: Secondary | ICD-10-CM | POA: Diagnosis not present

## 2017-02-28 DIAGNOSIS — Z720 Tobacco use: Secondary | ICD-10-CM | POA: Diagnosis not present

## 2017-02-28 DIAGNOSIS — Z23 Encounter for immunization: Secondary | ICD-10-CM

## 2017-02-28 DIAGNOSIS — B20 Human immunodeficiency virus [HIV] disease: Secondary | ICD-10-CM

## 2017-02-28 DIAGNOSIS — Z79899 Other long term (current) drug therapy: Secondary | ICD-10-CM

## 2017-02-28 DIAGNOSIS — Z7189 Other specified counseling: Secondary | ICD-10-CM

## 2017-02-28 DIAGNOSIS — Z7185 Encounter for immunization safety counseling: Secondary | ICD-10-CM

## 2017-02-28 NOTE — Progress Notes (Signed)
   Subjective:    Patient ID: [name], male    DOB: 02/29/1981, 36 y.o.   MRN: [id number]  HPI Here for follow up of HIV. Continues on Genvoya and denies any missed doses.  No associated n/v/d.  Sexually active with condoms always.  No penile discharge, no warts. Itching and constipation better with the treatments I gave.     Review of Systems  Constitutional: Negative for fatigue.  Gastrointestinal: Negative for diarrhea.  Skin: Negative for rash.  Neurological: Negative for dizziness.       Objective:   Physical Exam  Constitutional: He appears well-developed and well-nourished. No distress.  HENT:  Mouth/Throat: No oropharyngeal exudate.  Eyes: No scleral icterus.  Cardiovascular: Normal rate, regular rhythm and normal heart sounds.  No murmur heard. Lymphadenopathy:    He has no cervical adenopathy.  Skin: No rash noted.   SH: continue to smoke       Assessment & Plan:

## 2017-02-28 NOTE — Assessment & Plan Note (Signed)
Will screen today 

## 2017-02-28 NOTE — Assessment & Plan Note (Signed)
Counseled.  precontemplative

## 2017-02-28 NOTE — Assessment & Plan Note (Signed)
Doing well.  Labs today and rtc 6 months unless concerns.  Condoms offered

## 2017-02-28 NOTE — Assessment & Plan Note (Signed)
Counseled on the flu shot and given today 

## 2017-03-02 LAB — T-HELPER CELL (CD4) - (RCID CLINIC ONLY)
CD4 T CELL HELPER: 47 % (ref 33–55)
CD4 T Cell Abs: 1034 /uL (ref 400–2700)

## 2017-03-05 LAB — COMPLETE METABOLIC PANEL WITH GFR
AG RATIO: 1.7 (calc) (ref 1.0–2.5)
ALT: 20 U/L (ref 9–46)
AST: 15 U/L (ref 10–40)
Albumin: 4.8 g/dL (ref 3.6–5.1)
Alkaline phosphatase (APISO): 65 U/L (ref 40–115)
BUN: 13 mg/dL (ref 7–25)
CALCIUM: 9.9 mg/dL (ref 8.6–10.3)
CO2: 22 mmol/L (ref 20–32)
Chloride: 104 mmol/L (ref 98–110)
Creat: 1.04 mg/dL (ref 0.60–1.35)
GFR, EST NON AFRICAN AMERICAN: 91 mL/min/{1.73_m2} (ref >=60)
GFR, Est African American: 106 mL/min/{1.73_m2} (ref >=60)
GLOBULIN: 2.8 g/dL (ref 1.9–3.7)
Glucose, Bld: 105 mg/dL — ABNORMAL HIGH (ref 65–99)
POTASSIUM: 3.9 mmol/L (ref 3.5–5.3)
SODIUM: 137 mmol/L (ref 135–146)
Total Bilirubin: 0.5 mg/dL (ref 0.2–1.2)
Total Protein: 7.6 g/dL (ref 6.1–8.1)

## 2017-03-05 LAB — CBC WITH DIFFERENTIAL/PLATELET
BASOS ABS: 81 {cells}/uL (ref 0–200)
BASOS PCT: 1.3 %
EOS ABS: 229 {cells}/uL (ref 15–500)
Eosinophils Relative: 3.7 %
HEMATOCRIT: 43 % (ref 38.5–50.0)
HEMOGLOBIN: 14.8 g/dL (ref 13.2–17.1)
Lymphs Abs: 1829 cells/uL (ref 850–3900)
MCH: 31.8 pg (ref 27.0–33.0)
MCHC: 34.4 g/dL (ref 32.0–36.0)
MCV: 92.5 fL (ref 80.0–100.0)
MONOS PCT: 9.9 %
MPV: 9.2 fL (ref 7.5–12.5)
Neutro Abs: 3447 cells/uL (ref 1500–7800)
Neutrophils Relative %: 55.6 %
Platelets: 460 10*3/uL — ABNORMAL HIGH (ref 140–400)
RBC: 4.65 10*6/uL (ref 4.20–5.80)
RDW: 10.8 % — ABNORMAL LOW (ref 11.0–15.0)
Total Lymphocyte: 29.5 %
WBC mixed population: 614 cells/uL (ref 200–950)
WBC: 6.2 10*3/uL (ref 3.8–10.8)

## 2017-03-05 LAB — FLUORESCENT TREPONEMAL AB(FTA)-IGG-BLD: Fluorescent Treponemal ABS: REACTIVE — AB

## 2017-03-05 LAB — LIPID PANEL
CHOL/HDL RATIO: 4.7 (calc) (ref <5.0)
CHOLESTEROL: 201 mg/dL — AB (ref <200)
HDL: 43 mg/dL (ref >40)
LDL CHOLESTEROL (CALC): 140 mg/dL — AB
NON-HDL CHOLESTEROL (CALC): 158 mg/dL — AB (ref <130)
Triglycerides: 82 mg/dL (ref <150)

## 2017-03-05 LAB — RPR: RPR Ser Ql: REACTIVE — AB

## 2017-03-05 LAB — RPR TITER: RPR Titer: 1:2 {titer} — ABNORMAL HIGH

## 2017-03-06 LAB — HIV-1 RNA QUANT-NO REFLEX-BLD
HIV 1 RNA QUANT: DETECTED {copies}/mL — AB
HIV-1 RNA Quant, Log: <1.3 Log copies/mL — AB

## 2017-03-22 ENCOUNTER — Other Ambulatory Visit: Payer: Self-pay | Admitting: Internal Medicine

## 2017-03-22 DIAGNOSIS — I10 Essential (primary) hypertension: Secondary | ICD-10-CM

## 2017-03-22 DIAGNOSIS — B20 Human immunodeficiency virus [HIV] disease: Secondary | ICD-10-CM

## 2017-04-13 ENCOUNTER — Other Ambulatory Visit: Payer: Self-pay | Admitting: Internal Medicine

## 2017-04-13 DIAGNOSIS — I10 Essential (primary) hypertension: Secondary | ICD-10-CM

## 2017-05-14 ENCOUNTER — Other Ambulatory Visit: Payer: Self-pay | Admitting: Internal Medicine

## 2017-05-16 ENCOUNTER — Other Ambulatory Visit: Payer: Self-pay

## 2017-05-16 DIAGNOSIS — I10 Essential (primary) hypertension: Secondary | ICD-10-CM

## 2017-05-16 MED ORDER — AMLODIPINE BESYLATE 10 MG PO TABS: 10.0000 mg | ORAL_TABLET | Freq: Every day | ORAL | 1 refills | Status: DC

## 2017-05-16 NOTE — Telephone Encounter (Signed)
Patient calling for Norvasc refill.  He stated he does not have a primary care physician and Dr Luciana Axeomer has been refilling medication.  Patient was informed he saw internal medicine clinic in 2017 bud he does not remember the visit.     Norvasc sent to pharmacy and patient was informed to schedule appointment with primary so they can follow his hypertension.   Laurell Josephsammy K Anslie Spadafora, RN

## 2017-07-20 ENCOUNTER — Other Ambulatory Visit: Payer: Self-pay

## 2017-07-20 DIAGNOSIS — I10 Essential (primary) hypertension: Secondary | ICD-10-CM

## 2017-07-20 DIAGNOSIS — L299 Pruritus, unspecified: Secondary | ICD-10-CM

## 2017-07-20 MED ORDER — HYDROXYZINE HCL 10 MG PO TABS: ORAL_TABLET | ORAL | 3 refills | Status: DC

## 2017-07-20 MED ORDER — AMLODIPINE BESYLATE 10 MG PO TABS: 10.0000 mg | ORAL_TABLET | Freq: Every day | ORAL | 1 refills | Status: DC

## 2017-08-28 ENCOUNTER — Encounter: Payer: Self-pay | Admitting: Internal Medicine

## 2017-08-28 ENCOUNTER — Ambulatory Visit (INDEPENDENT_AMBULATORY_CARE_PROVIDER_SITE_OTHER): Payer: Medicaid Other | Admitting: Internal Medicine

## 2017-08-28 VITALS — BP 132/85 | HR 83 | Temp 98.0°F | Wt 165.0 lb

## 2017-08-28 DIAGNOSIS — Z7185 Encounter for immunization safety counseling: Secondary | ICD-10-CM

## 2017-08-28 DIAGNOSIS — Z23 Encounter for immunization: Secondary | ICD-10-CM | POA: Diagnosis not present

## 2017-08-28 DIAGNOSIS — Z7189 Other specified counseling: Secondary | ICD-10-CM | POA: Diagnosis not present

## 2017-08-28 DIAGNOSIS — Z720 Tobacco use: Secondary | ICD-10-CM | POA: Diagnosis not present

## 2017-08-28 DIAGNOSIS — B20 Human immunodeficiency virus [HIV] disease: Secondary | ICD-10-CM | POA: Diagnosis not present

## 2017-08-28 NOTE — Assessment & Plan Note (Signed)
Counseled on Menveo and pneumovax and will give today.  PCV 13 in 1 year.

## 2017-08-28 NOTE — Assessment & Plan Note (Signed)
Labs today and rtc 6 months unless concerns.  

## 2017-08-28 NOTE — Progress Notes (Signed)
   Subjective:    Patient ID: RIKU BUTTERY, male    DOB: 1980-07-22, 37 y.o.   MRN: [id number]  HPI Here for follow up of HIV Continues on Genvoya and no missed doses.  Feels well and no associated n/v/d.  No rashes.  Some occasional night sweats but not soaking sheets.  No weight loss.  Trying to quit smoking and reducing intake.     Review of Systems  Constitutional: Negative for fatigue.  Gastrointestinal: Negative for diarrhea and nausea.  Skin: Negative for rash.       Objective:   Physical Exam  Constitutional: He appears well-developed and well-nourished. No distress.  HENT:  Mouth/Throat: No oropharyngeal exudate.  Eyes: No scleral icterus.  Cardiovascular: Normal rate, regular rhythm and normal heart sounds.  No murmur heard. Pulmonary/Chest: Effort normal and breath sounds normal. No respiratory distress.  Skin: No rash noted.   SH: occasional alcohol, smoking down to 3 packs per week       Assessment & Plan:

## 2017-08-28 NOTE — Assessment & Plan Note (Signed)
Counseled on cessation and he is trying to quit

## 2017-08-29 LAB — T-HELPER CELL (CD4) - (RCID CLINIC ONLY)
CD4 T CELL ABS: 660 /uL (ref 400–2700)
CD4 T CELL HELPER: 46 % (ref 33–55)

## 2017-08-29 LAB — HEPATITIS B SURFACE ANTIBODY,QUALITATIVE: HEP B S AB: NONREACTIVE

## 2017-08-31 LAB — HIV-1 RNA QUANT-NO REFLEX-BLD
HIV 1 RNA QUANT: DETECTED {copies}/mL — AB
HIV-1 RNA Quant, Log: <1.3 Log copies/mL — AB

## 2017-09-03 LAB — TIQ-NTM

## 2017-09-03 LAB — HEPATITIS B SURFACE ANTIBODY,QUALITATIVE

## 2017-10-30 ENCOUNTER — Other Ambulatory Visit: Payer: Self-pay | Admitting: Internal Medicine

## 2017-10-30 DIAGNOSIS — B20 Human immunodeficiency virus [HIV] disease: Secondary | ICD-10-CM

## 2017-11-29 ENCOUNTER — Other Ambulatory Visit: Payer: Self-pay | Admitting: Infectious Diseases

## 2017-11-29 DIAGNOSIS — L299 Pruritus, unspecified: Secondary | ICD-10-CM

## 2018-02-08 ENCOUNTER — Emergency Department (HOSPITAL_COMMUNITY)
Admission: EM | Admit: 2018-02-08 | Discharge: 2018-02-09 | Disposition: A | Discharge status: Home / Self Care | Payer: Medicaid Other | Attending: Emergency Medicine | Admitting: Emergency Medicine

## 2018-02-08 ENCOUNTER — Other Ambulatory Visit: Payer: Self-pay

## 2018-02-08 ENCOUNTER — Encounter (HOSPITAL_COMMUNITY): Payer: Self-pay | Admitting: Emergency Medicine

## 2018-02-08 DIAGNOSIS — F17210 Nicotine dependence, cigarettes, uncomplicated: Secondary | ICD-10-CM | POA: Insufficient documentation

## 2018-02-08 DIAGNOSIS — F1010 Alcohol abuse, uncomplicated: Secondary | ICD-10-CM | POA: Diagnosis not present

## 2018-02-08 DIAGNOSIS — B20 Human immunodeficiency virus [HIV] disease: Secondary | ICD-10-CM | POA: Diagnosis not present

## 2018-02-08 DIAGNOSIS — F1910 Other psychoactive substance abuse, uncomplicated: Secondary | ICD-10-CM | POA: Insufficient documentation

## 2018-02-08 DIAGNOSIS — I10 Essential (primary) hypertension: Secondary | ICD-10-CM | POA: Diagnosis not present

## 2018-02-08 DIAGNOSIS — R45851 Suicidal ideations: Secondary | ICD-10-CM | POA: Diagnosis not present

## 2018-02-08 DIAGNOSIS — F1414 Cocaine abuse with cocaine-induced mood disorder: Secondary | ICD-10-CM | POA: Diagnosis not present

## 2018-02-08 DIAGNOSIS — F329 Major depressive disorder, single episode, unspecified: Secondary | ICD-10-CM | POA: Diagnosis present

## 2018-02-08 LAB — CBC
HEMATOCRIT: 48.9 % (ref 39.0–52.0)
Hemoglobin: 15.8 g/dL (ref 13.0–17.0)
MCH: 31.7 pg (ref 26.0–34.0)
MCHC: 32.3 g/dL (ref 30.0–36.0)
MCV: 98 fL (ref 80.0–100.0)
Platelets: 430 10*3/uL — ABNORMAL HIGH (ref 150–400)
RBC: 4.99 MIL/uL (ref 4.22–5.81)
RDW: 12 % (ref 11.5–15.5)
WBC: 11 10*3/uL — AB (ref 4.0–10.5)
nRBC: 0 % (ref 0.0–0.2)

## 2018-02-08 LAB — COMPREHENSIVE METABOLIC PANEL
ALT: 43 U/L (ref 0–44)
ANION GAP: 11 (ref 5–15)
AST: 29 U/L (ref 15–41)
Albumin: 5.6 g/dL — ABNORMAL HIGH (ref 3.5–5.0)
Alkaline Phosphatase: 63 U/L (ref 38–126)
BILIRUBIN TOTAL: 1 mg/dL (ref 0.3–1.2)
BUN: 11 mg/dL (ref 6–20)
CO2: 22 mmol/L (ref 22–32)
Calcium: 10 mg/dL (ref 8.9–10.3)
Chloride: 105 mmol/L (ref 98–111)
Creatinine, Ser: 0.95 mg/dL (ref 0.61–1.24)
GFR calc Af Amer: >60 mL/min (ref >60)
Glucose, Bld: 117 mg/dL — ABNORMAL HIGH (ref 70–99)
POTASSIUM: 3.4 mmol/L — AB (ref 3.5–5.1)
Sodium: 138 mmol/L (ref 135–145)
TOTAL PROTEIN: 9.5 g/dL — AB (ref 6.5–8.1)

## 2018-02-08 LAB — RAPID URINE DRUG SCREEN, HOSP PERFORMED
Amphetamines: NOT DETECTED
BARBITURATES: NOT DETECTED
Benzodiazepines: NOT DETECTED
COCAINE: POSITIVE — AB
Opiates: NOT DETECTED
Tetrahydrocannabinol: NOT DETECTED

## 2018-02-08 LAB — SALICYLATE LEVEL: Salicylate Lvl: <7 mg/dL (ref 2.8–30.0)

## 2018-02-08 LAB — ETHANOL

## 2018-02-08 LAB — ACETAMINOPHEN LEVEL

## 2018-02-08 MED ORDER — ALUM & MAG HYDROXIDE-SIMETH 200-200-20 MG/5ML PO SUSP: 30.0000 mL | Freq: Four times a day (QID) | ORAL | Status: DC | PRN

## 2018-02-08 MED ORDER — ONDANSETRON HCL 4 MG PO TABS: 4.0000 mg | ORAL_TABLET | Freq: Three times a day (TID) | ORAL | Status: DC | PRN

## 2018-02-08 MED ORDER — ZOLPIDEM TARTRATE 5 MG PO TABS: 5.0000 mg | ORAL_TABLET | Freq: Every evening | ORAL | Status: DC | PRN

## 2018-02-08 MED ORDER — IBUPROFEN 200 MG PO TABS: 600.0000 mg | ORAL_TABLET | Freq: Three times a day (TID) | ORAL | Status: DC | PRN

## 2018-02-08 NOTE — ED Triage Notes (Signed)
Patient c/o SI without plan and worsening depression x1 week. Reports last cocaine use today and last drink today at 1700. States he drinks daily. Hx HIV. Reports following up with ID and taking meds as prescribed.

## 2018-02-08 NOTE — ED Notes (Signed)
Writer asked patient if he could give a urine sample and patient stated, "it'll probably be morning before I can pee".

## 2018-02-08 NOTE — ED Notes (Signed)
Pt sts he intentionally ingested $1,000.00 dollars worth of cocaine this am.  Pt currently has no signs of w/d.  Pt denies SI, HI and AVH and pt contracts for safety.  Pt sts major complaint is drug use dependency and depression from not being able to quit.  Pt requests long term placement for addiction.

## 2018-02-08 NOTE — ED Notes (Signed)
Bed: [facility] Expected date:  Expected time:  Means of arrival:  Comments: 

## 2018-02-08 NOTE — ED Provider Notes (Signed)
[facility]-EMERGENCY DEPT Provider Note   CSN: [id number] Arrival date & time: 02/08/18  2005     History   Chief Complaint Chief Complaint  Patient presents with  . Suicidal    HPI [name] is a 37 y.o. male.  HPI Patient presents to the emergency department with suicidal thoughts along with drug use and alcohol use.  Patient states that he has not been seen in a while for this but several years back had issues.  Patient states that he did have a suicide attempt many years ago.  The patient states that nothing seems to make his condition better or worse.  He feels like his drug use is increased over the last month.  Patient states that he was supposed to be on psych meds but he did not take them and those were prescribed many years ago.  The patient denies chest pain, shortness of breath, headache,blurred vision, neck pain, fever, cough, weakness, numbness, dizziness, anorexia, edema, abdominal pain, nausea, vomiting, diarrhea, rash, back pain, dysuria, hematemesis, bloody stool, near syncope, or syncope. Past Medical History:  Diagnosis Date  . Depression   . HIV (human immunodeficiency virus infection) (HCC)   . HIV infection (HCC)   . Hx of suicide attempt 07/28/2011   Age 13.  Pt stated he wanted to kill himself and he was admitted to a mental health facility for a week or so.   Marland Kitchen Hypertension   . Psychiatric problem     Patient Active Problem List   Diagnosis Date Noted  . Vaccine counseling 08/28/2016  . Tobacco abuse 08/28/2016  . Itch 08/28/2016  . Constipation 08/28/2016  . Incontinence of urine 07/30/2015  . Screening examination for venereal disease 08/11/2014  . Encounter for long-term (current) use of medications 08/11/2014  . Syphilis contact, treated 08/11/2014  . MRSA (methicillin-resistant Staph aureus) carrier/suspected carrier 09/30/2013  . Polysubstance dependence (HCC) 05/11/2013  . Suicidal ideation 05/11/2013  . Mental  retardation, mild (I.Q. 50-70) 05/11/2013  . Recurrent major depression-severe (HCC) 05/11/2013  . Heparin induced thrombocytopenia (HCC) 08/27/2012  . Tibial plateau fracture 08/19/2012  . Vertebral artery dissection (HCC) 08/19/2012  . Alcohol abuse 08/19/2012  . HIV disease (HCC) 08/19/2012  . Essential hypertension, benign 08/19/2012  . C2 cervical fracture (HCC) 08/18/2012  . Right clavicle fracture 08/18/2012  . Liver laceration 08/18/2012  . Medial orbital wall fracture 08/18/2012  . Erectile dysfunction 11/07/2011  . Learning disability 07/28/2011  . Mental health disorder 07/28/2011  . Hx of suicide attempt 07/28/2011    Past Surgical History:  Procedure Laterality Date  . ORIF CLAVICULAR FRACTURE Right 08/21/2012   Procedure: OPEN REDUCTION INTERNAL FIXATION (ORIF) CLAVICULAR FRACTURE;  Surgeon: Eldred Manges, MD;  Location: [facility];  Service: Orthopedics;  Laterality: Right;  . ORIF TIBIA PLATEAU Right 08/21/2012   Procedure: OPEN REDUCTION INTERNAL FIXATION (ORIF) RIGHT LATERAL TIBIAL PLATEAU;  Surgeon: Eldred Manges, MD;  Location: [facility] OR;  Service: Orthopedics;  Laterality: Right;        Home Medications    Prior to Admission medications   Medication Sig Start Date End Date Taking? Authorizing Provider  amLODipine (NORVASC) 10 MG tablet Take 1 tablet (10 mg total) by mouth daily. 07/20/17   Comer, Belia Heman, MD  bisacodyl (DULCOLAX) 5 MG EC tablet Take 2 tablets (10 mg total) by mouth daily as needed for moderate constipation. 08/28/16   Gardiner Barefoot, MD  GENVOYA 150-150-200-10 MG TABS tablet TAKE 1 TABLET  BY MOUTH DAILY. 10/30/17   Gardiner Barefoot, MD  hydrOXYzine (ATARAX/VISTARIL) 10 MG tablet TAKE ONE TABLET (10 MG TOTAL) BY MOUTH THREE TIMES DAILY AS NEEDED FOR ITCHING 11/29/17   Comer, Belia Heman, MD  meloxicam (MOBIC) 7.5 MG tablet TAKE 1 TABLET (7.5 MG TOTAL) BY MOUTH DAILY. 05/14/16   Gwynn Burly, DO    Family History Family History  Problem Relation Age of  Onset  . Hypertension Mother   . Hypertension Father   . Heart attack Father 48    Social History Social History   Tobacco Use  . Smoking status: Current Every Day Smoker    Packs/day: 0.50    Years: 20.00    Pack years: 10.00    Types: Cigarettes  . Smokeless tobacco: Never Used  . Tobacco comment: encouraged to quit to help bp as well  Substance Use Topics  . Alcohol use: Yes    Alcohol/week: 8.0 standard drinks    Types: 8 Cans of beer per week    Comment: 3 - 6 packs (12oz cans) per week  . Drug use: No    Frequency: 3.0 times per week    Types: Marijuana    Comment: last smoked approx 6  months     Allergies   Penicillins and Heparin   Review of Systems Review of Systems All other systems negative except as documented in the HPI. All pertinent positives and negatives as reviewed in the HPI.  Physical Exam Updated Vital Signs BP (!) 184/125 (BP Location: Left Arm)   Pulse (!) 103   Temp (!) 97.5 F (36.4 C) (Oral)   Resp 16   SpO2 98%   Physical Exam  Constitutional: He is oriented to person, place, and time. He appears well-developed and well-nourished. No distress.  HENT:  Head: Normocephalic and atraumatic.  Mouth/Throat: Oropharynx is clear and moist.  Eyes: Pupils are equal, round, and reactive to light.  Neck: Normal range of motion. Neck supple.  Cardiovascular: Normal rate, regular rhythm and normal heart sounds. Exam reveals no gallop and no friction rub.  No murmur heard. Pulmonary/Chest: Effort normal and breath sounds normal. No respiratory distress. He has no wheezes.  Abdominal: Soft. Bowel sounds are normal. He exhibits no distension. There is no tenderness.  Neurological: He is alert and oriented to person, place, and time. He exhibits normal muscle tone. Coordination normal.  Skin: Skin is warm and dry. Capillary refill takes less than 2 seconds. No rash noted. No erythema.  Psychiatric: He has a normal mood and affect. His behavior is  normal. He expresses suicidal ideation. He expresses no homicidal ideation. He expresses no homicidal plans.  Nursing note and vitals reviewed.    ED Treatments / Results  Labs (all labs ordered are listed, but only abnormal results are displayed) Labs Reviewed  COMPREHENSIVE METABOLIC PANEL  ETHANOL  SALICYLATE LEVEL  ACETAMINOPHEN LEVEL  CBC  RAPID URINE DRUG SCREEN, HOSP PERFORMED    EKG None  Radiology No results found.  Procedures Procedures (including critical care time)  Medications Ordered in ED Medications - No data to display   Initial Impression / Assessment and Plan / ED Course  I have reviewed the triage vital signs and the nursing notes.  Pertinent labs & imaging results that were available during my care of the patient were reviewed by me and considered in my medical decision making (see chart for details).     Patient will need TTS assessment for his suicidal ideations.  The patient is otherwise stable.  Final Clinical Impressions(s) / ED Diagnoses   Final diagnoses:  None    ED Discharge Orders    None       Charlestine Night, PA-C 02/08/18 2054    Maia Plan, MD 02/09/18 [phone]
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
## 2018-02-09 DIAGNOSIS — F1414 Cocaine abuse with cocaine-induced mood disorder: Secondary | ICD-10-CM | POA: Diagnosis present

## 2018-02-09 DIAGNOSIS — F1910 Other psychoactive substance abuse, uncomplicated: Secondary | ICD-10-CM

## 2018-02-09 DIAGNOSIS — R45851 Suicidal ideations: Secondary | ICD-10-CM

## 2018-02-09 DIAGNOSIS — F1010 Alcohol abuse, uncomplicated: Secondary | ICD-10-CM

## 2018-02-09 NOTE — Patient Outreach (Signed)
ED Peer Support Specialist Patient Intake (Complete at intake & 30-60 Day Follow-up)  Name: [name]  MRN: [id number]  Age: 37 y.o.   Date of Admission: 02/09/2018  Intake: Initial Comments:      Primary Reason Admitted: who presents voluntary and unaccompanied to [facility]. Clinician asked the pt, "what brought you to the hospital?" Pt reported, he attempted suicide by smoking $1000 worth of crack cocaine hoping his heart would explode. Pt reported, his depression has worsened over the past two weeks. Pt reported his crack cocaine use has increase over this past month. Pt reported, he is still suicidal. Pt denies, HI, AVH, self-injurious behaviors and access to weapons.   Lab values: Alcohol/ETOH: Negative Positive UDS? Yes Amphetamines: No Barbiturates: No Benzodiazepines: No Cocaine: Yes Opiates: No Cannabinoids: No  Demographic information: Gender: Male Ethnicity: African American Marital Status: Single Insurance Status: Medicaid Control and instrumentation engineer (Work Engineer, agricultural, Sales executive, etc.: Yes([employer]) Lives with: Alone Living situation: House/Apartment  Reported Patient History: Patient reported health conditions: Depression Patient aware of HIV and hepatitis status: Yes (comment)(HIV)  In past year, has patient visited ED for any reason? No  Number of ED visits:    Reason(s) for visit:    In past year, has patient been hospitalized for any reason? No  Number of hospitalizations:    Reason(s) for hospitalization:    In past year, has patient been arrested? No  Number of arrests:    Reason(s) for arrest:    In past year, has patient been incarcerated? No  Number of incarcerations:    Reason(s) for incarceration:    In past year, has patient received medication-assisted treatment? No  In past year, patient received the following treatments:    In past year, has patient received any harm reduction services? No  Did this include any of the  following?    In past year, has patient received care from a mental health provider for diagnosis other than SUD? No  In past year, is this first time patient has overdosed? No  Number of past overdoses:    In past year, is this first time patient has been hospitalized for an overdose? No  Number of hospitalizations for overdose(s):    Is patient currently receiving treatment for a mental health diagnosis? No  Patient reports experiencing difficulty participating in SUD treatment: No    Most important reason(s) for this difficulty?    Has patient received prior services for treatment? No  In past, patient has received services from following agencies:    Plan of Care:  Suggested follow up at these agencies/treatment centers: (Seek serice in the facility.)  Other information: CPSS processed with Pt and was able to complete the series of questions needed to better assist Pt. CPSS addressed the fact that Pt maybe able to get into a facility for Substance abuse. CPSS discussed the importance of Pt having everything needed to get himself into a facility at this point. CPSS mentioned that Pt maybe able to get into a treatment facility possible Monday. CPSS is waiting to hear back from a facility.     Arlys John Florie Carico, CPSS  02/09/2018 12:43 PM

## 2018-02-09 NOTE — ED Notes (Signed)
Patient talking with peer support now.

## 2018-02-09 NOTE — Consult Note (Addendum)
[facility] Psych ED Discharge  02/09/2018 11:22 AM [name]  MRN:  [id number] Principal Problem: Cocaine abuse with cocaine-induced mood disorder [facility]) Discharge Diagnoses:  Patient Active Problem List   Diagnosis Date Noted  . Cocaine abuse with cocaine-induced mood disorder [facility]) [F14.14] 02/09/2018    Priority: High  . Vaccine counseling [Z71.89] 08/28/2016  . Tobacco abuse [Z72.0] 08/28/2016  . Itch [L29.9] 08/28/2016  . Constipation [K59.00] 08/28/2016  . Incontinence of urine [R32] 07/30/2015  . Screening examination for venereal disease [Z11.3] 08/11/2014  . Encounter for long-term (current) use of medications [Z79.899] 08/11/2014  . Syphilis contact, treated [Z20.2] 08/11/2014  . MRSA (methicillin-resistant Staph aureus) carrier/suspected carrier [Z22.322] 09/30/2013  . Mental retardation, mild (I.Q. 50-70) [F70] 05/11/2013  . Recurrent major depression-severe ([place]) [F33.2] 05/11/2013  . Heparin induced thrombocytopenia ([place]) [D75.82] 08/27/2012  . Tibial plateau fracture [S82.143A] 08/19/2012  . Vertebral artery dissection ([place]) [I77.74] 08/19/2012  . Alcohol abuse [F10.10] 08/19/2012  . HIV disease ([place]) [B20] 08/19/2012  . Essential hypertension, benign [I10] 08/19/2012  . C2 cervical fracture ([place]) [S12.100A] 08/18/2012  . Right clavicle fracture [S42.001A] 08/18/2012  . Liver laceration [S36.113A] 08/18/2012  . Medial orbital wall fracture [S02.839A] 08/18/2012  . Erectile dysfunction [N52.9] 11/07/2011  . Learning disability [F81.9] 07/28/2011  . Mental health disorder [F99] 07/28/2011  . Hx of suicide attempt [Z91.5] 07/28/2011    Subjective: 37 yo male who presented to the ED after using cocaine and having suicidal ideations.  Depresses for the past two weeks and increased with cocaine use, decreases with social support.  He spent his disability money on cocaine and was told to leave yesterday.  Evidently, his partner wants him to get help before he can return.  On  assessment today, he smiles and denies suicidal/homicidal ideations, hallucinations, and withdrawal symptoms.  Met with peer support and could not go to rehab without his medicine which was a problem because he removed the  Medication from the bottle so his partner would know he was HIV positive.  Sleep and food decreased his depression, 3/10 depression with some anxiety at times.  Agreeable to go to outpatient services.  Total Time spent with patient: 45 minutes  Past Psychiatric History: substance abuse  Past Medical History:  Past Medical History:  Diagnosis Date  . Depression   . HIV (human immunodeficiency virus infection) ([place])   . HIV infection ([place])   . Hx of suicide attempt 07/28/2011   Age 37.  Pt stated he wanted to kill himself and he was admitted to a mental health facility for a week or so.   Marland Kitchen Hypertension   . Psychiatric problem    Past Surgical History:  Procedure Laterality Date  . ORIF CLAVICULAR FRACTURE Right 08/21/2012   Procedure: OPEN REDUCTION INTERNAL FIXATION (ORIF) CLAVICULAR FRACTURE;  Surgeon: Marybelle Killings, MD;  Location: [place];  Service: Orthopedics;  Laterality: Right;  . ORIF TIBIA PLATEAU Right 08/21/2012   Procedure: OPEN REDUCTION INTERNAL FIXATION (ORIF) RIGHT LATERAL TIBIAL PLATEAU;  Surgeon: Marybelle Killings, MD;  Location: [place];  Service: Orthopedics;  Laterality: Right;   Family History:  Family History  Problem Relation Age of Onset  . Hypertension Mother   . Hypertension Father   . Heart attack Father 16   Family Psychiatric  History: none Social History:  Social History   Substance and Sexual Activity  Alcohol Use Yes  . Alcohol/week: 8.0 standard drinks  . Types: 8 Cans of beer per week  Comment: 3 - 6 packs (12oz cans) per week    Social History   Substance and Sexual Activity  Drug Use No  . Frequency: 3.0 times per week  . Types: Marijuana   Comment: last smoked approx 6  months   Social History   Socioeconomic History  .  Marital status: Married    Spouse name: Not on file  . Number of children: Not on file  . Years of education: Not on file  . Highest education level: Not on file  Occupational History  . Not on file  Social Needs  . Financial resource strain: Not on file  . Food insecurity:    Worry: Not on file    Inability: Not on file  . Transportation needs:    Medical: Not on file    Non-medical: Not on file  Tobacco Use  . Smoking status: Current Every Day Smoker    Packs/day: 0.50    Years: 20.00    Pack years: 10.00    Types: Cigarettes  . Smokeless tobacco: Never Used  . Tobacco comment: encouraged to quit to help bp as well  Substance and Sexual Activity  . Alcohol use: Yes    Alcohol/week: 8.0 standard drinks    Types: 8 Cans of beer per week    Comment: 3 - 6 packs (12oz cans) per week  . Drug use: No    Frequency: 3.0 times per week    Types: Marijuana    Comment: last smoked approx 6  months  . Sexual activity: Yes    Partners: Male    Birth control/protection: None    Comment: pt. given condoms - does not use condoms every time  Lifestyle  . Physical activity:    Days per week: Not on file    Minutes per session: Not on file  . Stress: Not on file  Relationships  . Social connections:    Talks on phone: Not on file    Gets together: Not on file    Attends religious service: Not on file    Active member of club or organization: Not on file    Attends meetings of clubs or organizations: Not on file    Relationship status: Not on file  Other Topics Concern  . Not on file  Social History Narrative   ** Merged History Encounter **        Has this patient used any form of tobacco in the last 30 days? (Cigarettes, Smokeless Tobacco, Cigars, and/or Pipes) Denies  Current Medications: Current Facility-Administered Medications  Medication Dose Route Frequency Provider Last Rate Last Dose  . alum & mag hydroxide-simeth (MAALOX/MYLANTA) 200-200-20 MG/5ML suspension 30  mL  30 mL Oral Q6H PRN Lawyer, Christopher, PA-C      . ibuprofen (ADVIL,MOTRIN) tablet 600 mg  600 mg Oral Q8H PRN Lawyer, Christopher, PA-C      . ondansetron (ZOFRAN) tablet 4 mg  4 mg Oral Q8H PRN Dalia Heading, PA-C       Current Outpatient Medications  Medication Sig Dispense Refill  . amLODipine (NORVASC) 10 MG tablet Take 1 tablet (10 mg total) by mouth daily. 90 tablet 1  . GENVOYA 150-150-200-10 MG TABS tablet TAKE 1 TABLET BY MOUTH DAILY. (Patient not taking: Reported on 02/08/2018) 30 tablet 6  . hydrOXYzine (ATARAX/VISTARIL) 10 MG tablet TAKE ONE TABLET (10 MG TOTAL) BY MOUTH THREE TIMES DAILY AS NEEDED FOR ITCHING (Patient not taking: Reported on 02/08/2018) 30 tablet 5   PTA  Medications:  (Not in a hospital admission)  Musculoskeletal: Strength & Muscle Tone: within normal limits Gait & Station: normal Patient leans: N/A  Psychiatric Specialty Exam: Physical Exam  Nursing note and vitals reviewed. Constitutional: He is oriented to person, place, and time. He appears well-developed and well-nourished.  Neck: Normal range of motion.  Respiratory: Effort normal.  Musculoskeletal: Normal range of motion.  Neurological: He is alert and oriented to person, place, and time.  Psychiatric: His speech is normal and behavior is normal. Judgment and thought content normal. His mood appears anxious. His affect is blunt. Cognition and memory are normal.    Review of Systems  Psychiatric/Behavioral: Positive for substance abuse. The patient is nervous/anxious.   All other systems reviewed and are negative.   Blood pressure 108/77, pulse 72, temperature 98.2 F (36.8 C), temperature source Oral, resp. rate 18, SpO2 98 %.There is no height or weight on file to calculate BMI.  General Appearance: Casual  Eye Contact:  Good  Speech:  Normal Rate  Volume:  Normal  Mood:  Anxious and Depressed  Affect:  Incongruent  Thought Process:  Coherent and Descriptions of Associations:  Intact  Orientation:  Full (Time, Place, and Person)  Thought Content:  WDL and Logical  Suicidal Thoughts:  No  Homicidal Thoughts:  No  Memory:  Immediate;   Good Recent;   Good Remote;   Good  Judgement:  Fair  Insight:  Fair  Psychomotor Activity:  Normal  Concentration:  Concentration: Good and Attention Span: Good  Recall:  Good  Fund of Knowledge:  Fair  Language:  Good  Akathisia:  No  Handed:  Right  AIMS (if indicated):     Assets:  Leisure Time Physical Health Resilience Social Support  ADL's:  Intact  Cognition:  Impaired,  Mild  Sleep:        Demographic Factors:  Male  Loss Factors: NA  Historical Factors: NA  Risk Reduction Factors:   Sense of responsibility to family, Living with another person, especially a relative and Positive social support  Continued Clinical Symptoms:  Depression and anxiety, mild  Cognitive Features That Contribute To Risk:  None    Suicide Risk:  Minimal: No identifiable suicidal ideation.  Patients presenting with no risk factors but with morbid ruminations; may be classified as minimal risk based on the severity of the depressive symptoms    Plan Of Care/Follow-up recommendations:  Cocaine abuse with cocaine induced mood disorder: -Peer support referral  Disposition:  Discharge home  Waylan Boga, NP 02/09/2018, 11:22 AM   Patient seen face to face for this evaluation, case discussed with treatment team and physician extender and formulated treatment plan. Reviewed the information documented and agree with the treatment plan.  Ambrose Finland, MD 02/09/2018

## 2018-02-09 NOTE — ED Notes (Signed)
Peer support found placement at [facility] for patient.

## 2018-03-04 ENCOUNTER — Encounter: Payer: Self-pay | Admitting: Internal Medicine

## 2018-03-04 ENCOUNTER — Ambulatory Visit (INDEPENDENT_AMBULATORY_CARE_PROVIDER_SITE_OTHER): Payer: Medicaid Other | Admitting: Internal Medicine

## 2018-03-04 ENCOUNTER — Other Ambulatory Visit (HOSPITAL_COMMUNITY)
Admission: RE | Admit: 2018-03-04 | Discharge: 2018-03-04 | Disposition: A | Discharge status: Home / Self Care | Payer: Medicaid Other | Source: Ambulatory Visit | Attending: Internal Medicine | Admitting: Internal Medicine

## 2018-03-04 VITALS — BP 166/104 | HR 84 | Temp 98.1°F | Ht 68.0 in | Wt 166.0 lb

## 2018-03-04 DIAGNOSIS — Z720 Tobacco use: Secondary | ICD-10-CM

## 2018-03-04 DIAGNOSIS — B20 Human immunodeficiency virus [HIV] disease: Secondary | ICD-10-CM | POA: Diagnosis present

## 2018-03-04 DIAGNOSIS — Z79899 Other long term (current) drug therapy: Secondary | ICD-10-CM

## 2018-03-04 DIAGNOSIS — Z23 Encounter for immunization: Secondary | ICD-10-CM | POA: Insufficient documentation

## 2018-03-04 DIAGNOSIS — Z113 Encounter for screening for infections with a predominantly sexual mode of transmission: Secondary | ICD-10-CM | POA: Insufficient documentation

## 2018-03-04 NOTE — Assessment & Plan Note (Signed)
I discussed the positive urine drug screen with him in cocaine and he says that he has been off cocaine again now and no consideration of going back.  He denies any suicidal ideation no homicidal ideation and is otherwise not even depressed at this time.  I did offer counseling to help with this but he says he is okay at this time.  He will call back if he needs counseling.

## 2018-03-04 NOTE — Assessment & Plan Note (Signed)
Counseled on Prevnar and flu shot and given today

## 2018-03-04 NOTE — Progress Notes (Signed)
   Subjective:    Patient ID: [name], male    DOB: April 16, 1980, 37 y.o.   MRN: [id number]  HPI He is here for follow-up of HIV. He continues on and denies any missed doses.  He has had no associated nausea or diarrhea.  He was recently in the emergency room due to some suicidal ideation though no current suicidal ideation now.  He was noted to be using cocaine in his urine drug screen was positive then again earlier this month.  He tells me he is now stopped using cocaine.  Feels well otherwise now.  He states he uses condoms with sex always.   Review of Systems  Constitutional: Negative for fatigue and unexpected weight change.  Skin: Negative for rash.  Neurological: Negative for dizziness.       Objective:   Physical Exam  Constitutional: He appears well-developed and well-nourished. No distress.  Eyes: No scleral icterus.  Cardiovascular: Normal rate, regular rhythm and normal heart sounds.  No murmur heard. Pulmonary/Chest: Effort normal and breath sounds normal. No respiratory distress.  Skin: No rash noted.   SH: current smoker, recent cocaine use.       Assessment & Plan:

## 2018-03-04 NOTE — Assessment & Plan Note (Signed)
He is doing well with his HIV and will continue on his current medication.  No changes today and he can return to clinic in 6 months unless there are concerns on today's labs.

## 2018-03-04 NOTE — Assessment & Plan Note (Signed)
He was counseled on cessation.

## 2018-03-04 NOTE — Assessment & Plan Note (Signed)
I will screen him today with urine as well as throat and rectal swab.

## 2018-03-04 NOTE — Assessment & Plan Note (Signed)
His hepatitis B Ab is negative so will restart the series, which he never completed previously, with a single dose.  #1 now and #2 in 1 month with nurse visit.  Will finish at his next appt.

## 2018-03-05 LAB — CYTOLOGY, (ORAL, ANAL, URETHRAL) ANCILLARY ONLY
Chlamydia: NEGATIVE
Chlamydia: NEGATIVE
Neisseria Gonorrhea: NEGATIVE
Neisseria Gonorrhea: NEGATIVE

## 2018-03-05 LAB — T-HELPER CELL (CD4) - (RCID CLINIC ONLY)
CD4 % Helper T Cell: 48 % (ref 33–55)
CD4 T Cell Abs: 950 /uL (ref 400–2700)

## 2018-03-05 LAB — URINE CYTOLOGY ANCILLARY ONLY
CHLAMYDIA, DNA PROBE: NEGATIVE
Neisseria Gonorrhea: NEGATIVE

## 2018-03-06 LAB — CBC WITH DIFFERENTIAL/PLATELET
BASOS ABS: 61 {cells}/uL (ref 0–200)
Basophils Relative: 1.1 %
EOS ABS: 209 {cells}/uL (ref 15–500)
EOS PCT: 3.8 %
HEMATOCRIT: 42.3 % (ref 38.5–50.0)
HEMOGLOBIN: 14.4 g/dL (ref 13.2–17.1)
LYMPHS ABS: 1997 {cells}/uL (ref 850–3900)
MCH: 32 pg (ref 27.0–33.0)
MCHC: 34 g/dL (ref 32.0–36.0)
MCV: 94 fL (ref 80.0–100.0)
MPV: 10.1 fL (ref 7.5–12.5)
Monocytes Relative: 8.8 %
NEUTROS ABS: 2750 {cells}/uL (ref 1500–7800)
Neutrophils Relative %: 50 %
Platelets: 428 10*3/uL — ABNORMAL HIGH (ref 140–400)
RBC: 4.5 10*6/uL (ref 4.20–5.80)
RDW: 10.5 % — ABNORMAL LOW (ref 11.0–15.0)
Total Lymphocyte: 36.3 %
WBC mixed population: 484 cells/uL (ref 200–950)
WBC: 5.5 10*3/uL (ref 3.8–10.8)

## 2018-03-06 LAB — COMPLETE METABOLIC PANEL WITH GFR
AG Ratio: 1.9 (calc) (ref 1.0–2.5)
ALT: 21 U/L (ref 9–46)
AST: 16 U/L (ref 10–40)
Albumin: 5 g/dL (ref 3.6–5.1)
Alkaline phosphatase (APISO): 67 U/L (ref 40–115)
BUN: 17 mg/dL (ref 7–25)
CALCIUM: 10.2 mg/dL (ref 8.6–10.3)
CO2: 23 mmol/L (ref 20–32)
CREATININE: 1.07 mg/dL (ref 0.60–1.35)
Chloride: 102 mmol/L (ref 98–110)
GFR, EST AFRICAN AMERICAN: 102 mL/min/{1.73_m2} (ref >=60)
GFR, EST NON AFRICAN AMERICAN: 88 mL/min/{1.73_m2} (ref >=60)
GLUCOSE: 82 mg/dL (ref 65–99)
Globulin: 2.6 g/dL (calc) (ref 1.9–3.7)
Potassium: 4.3 mmol/L (ref 3.5–5.3)
Sodium: 137 mmol/L (ref 135–146)
TOTAL PROTEIN: 7.6 g/dL (ref 6.1–8.1)
Total Bilirubin: 0.5 mg/dL (ref 0.2–1.2)

## 2018-03-06 LAB — HIV-1 RNA QUANT-NO REFLEX-BLD
HIV 1 RNA Quant: <20 copies/mL — AB
HIV-1 RNA QUANT, LOG: DETECTED {Log_copies}/mL — AB

## 2018-03-06 LAB — LIPID PANEL
CHOL/HDL RATIO: 4.6 (calc) (ref <5.0)
Cholesterol: 205 mg/dL — ABNORMAL HIGH (ref <200)
HDL: 45 mg/dL (ref >40)
LDL CHOLESTEROL (CALC): 138 mg/dL — AB
Non-HDL Cholesterol (Calc): 160 mg/dL (calc) — ABNORMAL HIGH (ref <130)
TRIGLYCERIDES: 103 mg/dL (ref <150)

## 2018-03-06 LAB — RPR: RPR: REACTIVE — AB

## 2018-03-06 LAB — RPR TITER

## 2018-03-06 LAB — FLUORESCENT TREPONEMAL AB(FTA)-IGG-BLD: FLUORESCENT TREPONEMAL ABS: REACTIVE — AB

## 2018-03-18 ENCOUNTER — Other Ambulatory Visit: Payer: Medicaid Other

## 2018-04-04 ENCOUNTER — Ambulatory Visit (INDEPENDENT_AMBULATORY_CARE_PROVIDER_SITE_OTHER): Payer: Medicaid Other | Admitting: *Deleted

## 2018-04-04 DIAGNOSIS — Z23 Encounter for immunization: Secondary | ICD-10-CM

## 2018-05-01 ENCOUNTER — Other Ambulatory Visit: Payer: Self-pay | Admitting: Internal Medicine

## 2018-05-01 DIAGNOSIS — I10 Essential (primary) hypertension: Secondary | ICD-10-CM

## 2018-06-03 ENCOUNTER — Other Ambulatory Visit: Payer: Self-pay | Admitting: Internal Medicine

## 2018-06-03 DIAGNOSIS — L299 Pruritus, unspecified: Secondary | ICD-10-CM

## 2018-06-03 DIAGNOSIS — B20 Human immunodeficiency virus [HIV] disease: Secondary | ICD-10-CM

## 2018-07-11 ENCOUNTER — Other Ambulatory Visit: Payer: Self-pay | Admitting: Internal Medicine

## 2018-07-11 DIAGNOSIS — I10 Essential (primary) hypertension: Secondary | ICD-10-CM

## 2018-08-19 ENCOUNTER — Other Ambulatory Visit: Payer: Self-pay | Admitting: Internal Medicine

## 2018-08-19 DIAGNOSIS — L299 Pruritus, unspecified: Secondary | ICD-10-CM

## 2018-08-20 ENCOUNTER — Other Ambulatory Visit: Payer: Medicaid Other

## 2018-08-20 ENCOUNTER — Other Ambulatory Visit: Payer: Self-pay

## 2018-08-20 ENCOUNTER — Other Ambulatory Visit: Payer: Self-pay | Admitting: *Deleted

## 2018-08-20 DIAGNOSIS — B20 Human immunodeficiency virus [HIV] disease: Secondary | ICD-10-CM

## 2018-08-21 LAB — T-HELPER CELL (CD4) - (RCID CLINIC ONLY)
CD4 % Helper T Cell: 49 % (ref 33–65)
CD4 T Cell Abs: 1050 /uL (ref 400–1790)

## 2018-08-27 LAB — COMPLETE METABOLIC PANEL WITH GFR
AG Ratio: 1.6 (calc) (ref 1.0–2.5)
ALT: 21 U/L (ref 9–46)
AST: 15 U/L (ref 10–40)
Albumin: 4.7 g/dL (ref 3.6–5.1)
Alkaline phosphatase (APISO): 77 U/L (ref 36–130)
BUN/Creatinine Ratio: 18 (calc) (ref 6–22)
BUN: 33 mg/dL — ABNORMAL HIGH (ref 7–25)
CO2: 23 mmol/L (ref 20–32)
Calcium: 10.2 mg/dL (ref 8.6–10.3)
Chloride: 102 mmol/L (ref 98–110)
Creat: 1.83 mg/dL — ABNORMAL HIGH (ref 0.60–1.35)
GFR, Est African American: 53 mL/min/{1.73_m2} — ABNORMAL LOW (ref >=60)
GFR, Est Non African American: 45 mL/min/{1.73_m2} — ABNORMAL LOW (ref >=60)
Globulin: 3 g/dL (calc) (ref 1.9–3.7)
Glucose, Bld: 93 mg/dL (ref 65–99)
Potassium: 4.1 mmol/L (ref 3.5–5.3)
Sodium: 136 mmol/L (ref 135–146)
Total Bilirubin: 0.4 mg/dL (ref 0.2–1.2)
Total Protein: 7.7 g/dL (ref 6.1–8.1)

## 2018-08-27 LAB — CBC WITH DIFFERENTIAL/PLATELET
Absolute Monocytes: 719 cells/uL (ref 200–950)
Basophils Absolute: 81 cells/uL (ref 0–200)
Basophils Relative: 1.3 %
Eosinophils Absolute: 248 cells/uL (ref 15–500)
Eosinophils Relative: 4 %
HCT: 46.2 % (ref 38.5–50.0)
Hemoglobin: 15.8 g/dL (ref 13.2–17.1)
Lymphs Abs: 2182 cells/uL (ref 850–3900)
MCH: 31.7 pg (ref 27.0–33.0)
MCHC: 34.2 g/dL (ref 32.0–36.0)
MCV: 92.8 fL (ref 80.0–100.0)
MPV: 9.8 fL (ref 7.5–12.5)
Monocytes Relative: 11.6 %
Neutro Abs: 2970 cells/uL (ref 1500–7800)
Neutrophils Relative %: 47.9 %
Platelets: 402 10*3/uL — ABNORMAL HIGH (ref 140–400)
RBC: 4.98 10*6/uL (ref 4.20–5.80)
RDW: 11 % (ref 11.0–15.0)
Total Lymphocyte: 35.2 %
WBC: 6.2 10*3/uL (ref 3.8–10.8)

## 2018-08-27 LAB — HIV-1 RNA QUANT-NO REFLEX-BLD
HIV 1 RNA Quant: <20 copies/mL — AB
HIV-1 RNA Quant, Log: <1.3 Log copies/mL — AB

## 2018-08-29 ENCOUNTER — Telehealth: Payer: Self-pay | Admitting: Internal Medicine

## 2018-08-29 NOTE — Telephone Encounter (Signed)
COVID-19 Pre-Screening Questions: ° °Do you currently have a fever (>100 °F), chills or unexplained body aches? No  ° °Are you currently experiencing new cough, shortness of breath, sore throat, runny nose? No  °•  °Have you recently travelled outside the state of [place] in the last 14 days? No  °•  °Have you been in contact with someone that is currently pending confirmation of Covid19 testing or has been confirmed to have the Covid19 virus?  No  °

## 2018-09-03 ENCOUNTER — Encounter: Payer: Self-pay | Admitting: Internal Medicine

## 2018-09-03 ENCOUNTER — Other Ambulatory Visit: Payer: Self-pay

## 2018-09-03 ENCOUNTER — Ambulatory Visit (INDEPENDENT_AMBULATORY_CARE_PROVIDER_SITE_OTHER): Payer: Medicare Other | Admitting: Internal Medicine

## 2018-09-03 VITALS — BP 130/84 | HR 80 | Temp 98.4°F | Ht 68.0 in | Wt 175.0 lb

## 2018-09-03 DIAGNOSIS — Z79899 Other long term (current) drug therapy: Secondary | ICD-10-CM

## 2018-09-03 DIAGNOSIS — Z5181 Encounter for therapeutic drug level monitoring: Secondary | ICD-10-CM | POA: Diagnosis not present

## 2018-09-03 DIAGNOSIS — Z113 Encounter for screening for infections with a predominantly sexual mode of transmission: Secondary | ICD-10-CM

## 2018-09-03 DIAGNOSIS — B20 Human immunodeficiency virus [HIV] disease: Secondary | ICD-10-CM | POA: Diagnosis present

## 2018-09-03 DIAGNOSIS — I10 Essential (primary) hypertension: Secondary | ICD-10-CM

## 2018-09-03 DIAGNOSIS — Z0000 Encounter for general adult medical examination without abnormal findings: Secondary | ICD-10-CM | POA: Insufficient documentation

## 2018-09-03 NOTE — Assessment & Plan Note (Signed)
Creat with mild elevation.  Will recheck next visit.  

## 2018-09-03 NOTE — Assessment & Plan Note (Signed)
BP wnl.  No changes.  Needs PCP

## 2018-09-03 NOTE — Assessment & Plan Note (Signed)
Doing well, no changes.  rtc 6 months.  

## 2018-09-03 NOTE — Progress Notes (Signed)
   Subjective:    Patient ID: [name], male    DOB: Nov 04, 1980, 38 y.o.   MRN: [id number]  HPI He is here for follow-up of HIV. He continues on and denies any missed doses.  He has had no associated nausea or diarrhea. No new complaints.  Some recent weight gain.     Review of Systems  Constitutional: Negative for fatigue and unexpected weight change.  Skin: Negative for rash.  Neurological: Negative for dizziness.       Objective:   Physical Exam Constitutional:      General: He is not in acute distress.    Appearance: He is well-developed.  Eyes:     General: No scleral icterus. Cardiovascular:     Rate and Rhythm: Normal rate and regular rhythm.     Heart sounds: Normal heart sounds. No murmur.  Pulmonary:     Effort: Pulmonary effort is normal. No respiratory distress.     Breath sounds: Normal breath sounds.  Skin:    Findings: No rash.    SH: current smoker       Assessment & Plan:

## 2018-09-03 NOTE — Assessment & Plan Note (Signed)
Screened negative 

## 2018-09-04 ENCOUNTER — Other Ambulatory Visit: Payer: Self-pay | Admitting: Internal Medicine

## 2018-09-04 DIAGNOSIS — B20 Human immunodeficiency virus [HIV] disease: Secondary | ICD-10-CM

## 2018-12-03 ENCOUNTER — Other Ambulatory Visit: Payer: Self-pay | Admitting: Internal Medicine

## 2018-12-03 DIAGNOSIS — L299 Pruritus, unspecified: Secondary | ICD-10-CM

## 2019-02-10 ENCOUNTER — Other Ambulatory Visit: Payer: Medicaid Other

## 2019-02-10 ENCOUNTER — Other Ambulatory Visit: Payer: Self-pay

## 2019-02-10 DIAGNOSIS — Z79899 Other long term (current) drug therapy: Secondary | ICD-10-CM

## 2019-02-10 DIAGNOSIS — B20 Human immunodeficiency virus [HIV] disease: Secondary | ICD-10-CM

## 2019-02-10 DIAGNOSIS — Z113 Encounter for screening for infections with a predominantly sexual mode of transmission: Secondary | ICD-10-CM

## 2019-02-10 NOTE — Addendum Note (Signed)
Addended by: ABBITT, KATRINA F on: 02/10/2019 10:36 AM ° ° Modules accepted: Orders ° °

## 2019-02-10 NOTE — Addendum Note (Signed)
Addended byMeriel Pica F on: 02/10/2019 10:36 AM   Modules accepted: Orders

## 2019-02-11 LAB — T-HELPER CELL (CD4) - (RCID CLINIC ONLY)
CD4 % Helper T Cell: 48 % (ref 33–65)
CD4 T Cell Abs: 945 /uL (ref 400–1790)

## 2019-02-13 ENCOUNTER — Other Ambulatory Visit: Payer: Self-pay | Admitting: Internal Medicine

## 2019-02-13 DIAGNOSIS — L299 Pruritus, unspecified: Secondary | ICD-10-CM

## 2019-02-14 LAB — CBC WITH DIFFERENTIAL/PLATELET
Absolute Monocytes: 509 cells/uL (ref 200–950)
Basophils Absolute: 80 cells/uL (ref 0–200)
Basophils Relative: 1.2 %
Eosinophils Absolute: 80 cells/uL (ref 15–500)
Eosinophils Relative: 1.2 %
HCT: 44.8 % (ref 38.5–50.0)
Hemoglobin: 15.1 g/dL (ref 13.2–17.1)
Lymphs Abs: 2137 cells/uL (ref 850–3900)
MCH: 31.5 pg (ref 27.0–33.0)
MCHC: 33.7 g/dL (ref 32.0–36.0)
MCV: 93.5 fL (ref 80.0–100.0)
MPV: 9.4 fL (ref 7.5–12.5)
Monocytes Relative: 7.6 %
Neutro Abs: 3893 cells/uL (ref 1500–7800)
Neutrophils Relative %: 58.1 %
Platelets: 424 10*3/uL — ABNORMAL HIGH (ref 140–400)
RBC: 4.79 10*6/uL (ref 4.20–5.80)
RDW: 10.9 % — ABNORMAL LOW (ref 11.0–15.0)
Total Lymphocyte: 31.9 %
WBC: 6.7 10*3/uL (ref 3.8–10.8)

## 2019-02-14 LAB — COMPLETE METABOLIC PANEL WITH GFR
AG Ratio: 1.7 (calc) (ref 1.0–2.5)
ALT: 25 U/L (ref 9–46)
AST: 17 U/L (ref 10–40)
Albumin: 4.7 g/dL (ref 3.6–5.1)
Alkaline phosphatase (APISO): 60 U/L (ref 36–130)
BUN: 16 mg/dL (ref 7–25)
CO2: 23 mmol/L (ref 20–32)
Calcium: 9.9 mg/dL (ref 8.6–10.3)
Chloride: 105 mmol/L (ref 98–110)
Creat: 1.05 mg/dL (ref 0.60–1.35)
GFR, Est African American: 103 mL/min/{1.73_m2} (ref >=60)
GFR, Est Non African American: 89 mL/min/{1.73_m2} (ref >=60)
Globulin: 2.8 g/dL (calc) (ref 1.9–3.7)
Glucose, Bld: 86 mg/dL (ref 65–99)
Potassium: 4.2 mmol/L (ref 3.5–5.3)
Sodium: 135 mmol/L (ref 135–146)
Total Bilirubin: 0.6 mg/dL (ref 0.2–1.2)
Total Protein: 7.5 g/dL (ref 6.1–8.1)

## 2019-02-14 LAB — LIPID PANEL
Cholesterol: 202 mg/dL — ABNORMAL HIGH (ref <200)
HDL: 50 mg/dL (ref >=40)
LDL Cholesterol (Calc): 131 mg/dL (calc) — ABNORMAL HIGH
Non-HDL Cholesterol (Calc): 152 mg/dL (calc) — ABNORMAL HIGH (ref <130)
Total CHOL/HDL Ratio: 4 (calc) (ref <5.0)
Triglycerides: 104 mg/dL (ref <150)

## 2019-02-14 LAB — HIV-1 RNA QUANT-NO REFLEX-BLD
HIV 1 RNA Quant: <20 copies/mL — AB
HIV-1 RNA Quant, Log: <1.3 Log copies/mL — AB

## 2019-02-14 LAB — RPR TITER: RPR Titer: 1:1 {titer} — ABNORMAL HIGH

## 2019-02-14 LAB — RPR: RPR Ser Ql: REACTIVE — AB

## 2019-02-14 LAB — FLUORESCENT TREPONEMAL AB(FTA)-IGG-BLD: Fluorescent Treponemal ABS: REACTIVE — AB

## 2019-02-24 ENCOUNTER — Other Ambulatory Visit (HOSPITAL_COMMUNITY)
Admission: RE | Admit: 2019-02-24 | Discharge: 2019-02-24 | Disposition: A | Discharge status: Home / Self Care | Payer: Medicare Other | Source: Ambulatory Visit | Attending: Internal Medicine | Admitting: Internal Medicine

## 2019-02-24 ENCOUNTER — Ambulatory Visit (INDEPENDENT_AMBULATORY_CARE_PROVIDER_SITE_OTHER): Payer: Medicare Other | Admitting: Internal Medicine

## 2019-02-24 ENCOUNTER — Other Ambulatory Visit: Payer: Self-pay

## 2019-02-24 ENCOUNTER — Encounter: Payer: Self-pay | Admitting: Internal Medicine

## 2019-02-24 VITALS — BP 153/89 | HR 80 | Temp 98.0°F | Ht 68.0 in | Wt 175.0 lb

## 2019-02-24 DIAGNOSIS — Z113 Encounter for screening for infections with a predominantly sexual mode of transmission: Secondary | ICD-10-CM | POA: Diagnosis present

## 2019-02-24 DIAGNOSIS — Z23 Encounter for immunization: Secondary | ICD-10-CM | POA: Diagnosis not present

## 2019-02-24 DIAGNOSIS — Z5181 Encounter for therapeutic drug level monitoring: Secondary | ICD-10-CM

## 2019-02-24 DIAGNOSIS — B20 Human immunodeficiency virus [HIV] disease: Secondary | ICD-10-CM | POA: Diagnosis present

## 2019-02-24 DIAGNOSIS — Z720 Tobacco use: Secondary | ICD-10-CM

## 2019-02-24 MED ORDER — TRAZODONE HCL 50 MG PO TABS: 50.0000 mg | ORAL_TABLET | Freq: Every evening | ORAL | 2 refills | Status: DC | PRN

## 2019-02-24 NOTE — Progress Notes (Signed)
   Subjective:    Patient ID: [name], male    DOB: April 10, 1981, 38 y.o.   MRN: [id number]  HPI Here for follow up of HIV He continues on Genvoya and no missed doses.  Having some issues with restless leg and interrupting sleep. Does not have a PCP.  CD4 of 945, viral load < 20.  Creat wnl.  No other complaints.  No associated n/v/d.  No rash.   Review of Systems  Constitutional: Negative for fatigue.  Gastrointestinal: Negative for diarrhea and nausea.  Skin: Negative for rash.       Objective:   Physical Exam Constitutional:      Appearance: Normal appearance.  Eyes:     General: No scleral icterus. Cardiovascular:     Rate and Rhythm: Normal rate and regular rhythm.     Heart sounds: No murmur.  Pulmonary:     Effort: Pulmonary effort is normal.  Neurological:     Mental Status: He is alert.  Psychiatric:        Mood and Affect: Mood normal.   SH: + tobacco       Assessment & Plan:

## 2019-02-25 ENCOUNTER — Other Ambulatory Visit: Payer: Self-pay | Admitting: Internal Medicine

## 2019-02-25 DIAGNOSIS — I10 Essential (primary) hypertension: Secondary | ICD-10-CM

## 2019-02-25 LAB — CYTOLOGY, (ORAL, ANAL, URETHRAL) ANCILLARY ONLY
Chlamydia: NEGATIVE
Chlamydia: NEGATIVE
Comment: NEGATIVE
Comment: NEGATIVE
Comment: NORMAL
Comment: NORMAL
Neisseria Gonorrhea: NEGATIVE
Neisseria Gonorrhea: NEGATIVE

## 2019-02-25 NOTE — Assessment & Plan Note (Signed)
Counseled

## 2019-02-25 NOTE — Assessment & Plan Note (Signed)
Screened negative and will do swabs as well.

## 2019-02-25 NOTE — Assessment & Plan Note (Addendum)
Creat stable, no concerns as it is improved from a previous mild elevation

## 2019-02-25 NOTE — Assessment & Plan Note (Signed)
He continues to do well on Genvoya and no issues with this and labs confirm this.   rtc 6 months.

## 2019-03-05 ENCOUNTER — Other Ambulatory Visit: Payer: Medicaid Other

## 2019-03-07 ENCOUNTER — Other Ambulatory Visit: Payer: Self-pay | Admitting: Internal Medicine

## 2019-03-07 DIAGNOSIS — B20 Human immunodeficiency virus [HIV] disease: Secondary | ICD-10-CM

## 2019-03-19 ENCOUNTER — Encounter: Payer: Medicaid Other | Admitting: Internal Medicine

## 2019-03-22 ENCOUNTER — Other Ambulatory Visit: Payer: Self-pay | Admitting: Internal Medicine

## 2019-03-22 DIAGNOSIS — L299 Pruritus, unspecified: Secondary | ICD-10-CM

## 2019-06-11 ENCOUNTER — Other Ambulatory Visit: Payer: Self-pay | Admitting: Internal Medicine

## 2019-06-11 DIAGNOSIS — I10 Essential (primary) hypertension: Secondary | ICD-10-CM

## 2019-07-30 ENCOUNTER — Other Ambulatory Visit: Payer: Self-pay | Admitting: Internal Medicine

## 2019-08-11 ENCOUNTER — Other Ambulatory Visit: Payer: Medicaid Other

## 2019-08-13 ENCOUNTER — Other Ambulatory Visit: Payer: Medicaid Other

## 2019-08-18 ENCOUNTER — Other Ambulatory Visit: Payer: Medicare Other

## 2019-08-25 ENCOUNTER — Encounter: Payer: Medicare Other | Admitting: Internal Medicine

## 2019-09-02 ENCOUNTER — Other Ambulatory Visit: Payer: Self-pay | Admitting: Internal Medicine

## 2019-09-02 DIAGNOSIS — B20 Human immunodeficiency virus [HIV] disease: Secondary | ICD-10-CM

## 2019-09-03 ENCOUNTER — Other Ambulatory Visit: Payer: Self-pay | Admitting: Internal Medicine

## 2019-09-03 DIAGNOSIS — B20 Human immunodeficiency virus [HIV] disease: Secondary | ICD-10-CM

## 2019-09-11 ENCOUNTER — Other Ambulatory Visit: Payer: Self-pay | Admitting: Internal Medicine

## 2019-09-11 DIAGNOSIS — I10 Essential (primary) hypertension: Secondary | ICD-10-CM

## 2019-09-22 ENCOUNTER — Encounter: Payer: Medicare Other | Admitting: Internal Medicine

## 2019-09-29 ENCOUNTER — Other Ambulatory Visit: Payer: Self-pay

## 2019-09-29 ENCOUNTER — Other Ambulatory Visit: Payer: Self-pay | Admitting: Internal Medicine

## 2019-09-29 ENCOUNTER — Encounter: Payer: Self-pay | Admitting: Internal Medicine

## 2019-09-29 ENCOUNTER — Ambulatory Visit (INDEPENDENT_AMBULATORY_CARE_PROVIDER_SITE_OTHER): Payer: Medicare Other | Admitting: Internal Medicine

## 2019-09-29 VITALS — BP 136/80 | HR 71 | Temp 98.6°F | Wt 162.0 lb

## 2019-09-29 DIAGNOSIS — Z720 Tobacco use: Secondary | ICD-10-CM

## 2019-09-29 DIAGNOSIS — B20 Human immunodeficiency virus [HIV] disease: Secondary | ICD-10-CM

## 2019-09-29 NOTE — Assessment & Plan Note (Signed)
He is doing well on the Genvoya and no issues.  Labs today and rtc 6 months unless concerns.

## 2019-09-29 NOTE — Progress Notes (Signed)
   Subjective:    Patient ID: [name], male    DOB: 1981/03/27, 39 y.o.   MRN: [id number]  HPI Here for follow up of HIV He continues on Genvoya and no missed doses.  CD4 of 945, viral load < 20 last visit.  Creat wnl.  No other complaints.  No associated n/v/d.  No rash.   Review of Systems  Constitutional: Negative for fatigue.  Gastrointestinal: Negative for diarrhea and nausea.  Skin: Negative for rash.       Objective:   Physical Exam Constitutional:      Appearance: Normal appearance.  Eyes:     General: No scleral icterus. Cardiovascular:     Rate and Rhythm: Normal rate and regular rhythm.     Heart sounds: No murmur heard.   Pulmonary:     Effort: Pulmonary effort is normal.  Neurological:     Mental Status: He is alert.  Psychiatric:        Mood and Affect: Mood normal.          Assessment & Plan:

## 2019-09-29 NOTE — Assessment & Plan Note (Signed)
Counseled on quitting °

## 2019-09-30 LAB — T-HELPER CELL (CD4) - (RCID CLINIC ONLY)
CD4 % Helper T Cell: 48 % (ref 33–65)
CD4 T Cell Abs: 1179 /uL (ref 400–1790)

## 2019-10-01 LAB — HIV-1 RNA QUANT-NO REFLEX-BLD
HIV 1 RNA Quant: 77 copies/mL — ABNORMAL HIGH
HIV-1 RNA Quant, Log: 1.89 Log copies/mL — ABNORMAL HIGH

## 2019-10-29 ENCOUNTER — Other Ambulatory Visit: Payer: Self-pay | Admitting: Internal Medicine

## 2019-10-29 DIAGNOSIS — B20 Human immunodeficiency virus [HIV] disease: Secondary | ICD-10-CM

## 2020-01-21 ENCOUNTER — Other Ambulatory Visit: Payer: Self-pay | Admitting: Internal Medicine

## 2020-01-21 DIAGNOSIS — I10 Essential (primary) hypertension: Secondary | ICD-10-CM

## 2020-02-18 ENCOUNTER — Telehealth: Payer: Self-pay

## 2020-02-18 ENCOUNTER — Other Ambulatory Visit: Payer: Self-pay | Admitting: Internal Medicine

## 2020-02-18 NOTE — Telephone Encounter (Signed)
Per Dr. Luciana Axe ok to refill with 5 refills.  [name]

## 2020-02-18 NOTE — Telephone Encounter (Signed)
Ok to refill, 5 refills. thanks

## 2020-02-18 NOTE — Telephone Encounter (Signed)
Received refill Rx Auth Request Received: Today Requested Prescriptions   Name from pharmacy: TRAZODONE HCL 50MG  TAB     Will file in chart as: traZODone (DESYREL) 50 MG tablet  Sig: TAKE ONE TABLET BY MOUTH AT BEDTIME AS NEEDED FOR SLEEP  Disp:  30 tablet  Refills:  0  Routing to MD if appropriate to refill. 

## 2020-03-24 ENCOUNTER — Encounter: Payer: Self-pay | Admitting: Internal Medicine

## 2020-03-24 ENCOUNTER — Ambulatory Visit (INDEPENDENT_AMBULATORY_CARE_PROVIDER_SITE_OTHER): Payer: Medicare Other | Admitting: Internal Medicine

## 2020-03-24 ENCOUNTER — Other Ambulatory Visit: Payer: Self-pay

## 2020-03-24 ENCOUNTER — Other Ambulatory Visit: Payer: Self-pay | Admitting: Internal Medicine

## 2020-03-24 VITALS — BP 147/87 | HR 73 | Temp 98.4°F | Wt 164.0 lb

## 2020-03-24 DIAGNOSIS — Z23 Encounter for immunization: Secondary | ICD-10-CM | POA: Diagnosis not present

## 2020-03-24 DIAGNOSIS — Z720 Tobacco use: Secondary | ICD-10-CM | POA: Diagnosis not present

## 2020-03-24 DIAGNOSIS — Z113 Encounter for screening for infections with a predominantly sexual mode of transmission: Secondary | ICD-10-CM | POA: Diagnosis not present

## 2020-03-24 DIAGNOSIS — I10 Essential (primary) hypertension: Secondary | ICD-10-CM

## 2020-03-24 DIAGNOSIS — Z79899 Other long term (current) drug therapy: Secondary | ICD-10-CM | POA: Diagnosis not present

## 2020-03-24 DIAGNOSIS — B20 Human immunodeficiency virus [HIV] disease: Secondary | ICD-10-CM | POA: Diagnosis present

## 2020-03-24 DIAGNOSIS — Z5181 Encounter for therapeutic drug level monitoring: Secondary | ICD-10-CM

## 2020-03-24 NOTE — Assessment & Plan Note (Signed)
Will screen today 

## 2020-03-24 NOTE — Assessment & Plan Note (Signed)
Cessation discussed 

## 2020-03-24 NOTE — Assessment & Plan Note (Addendum)
He continues to do well with no issues.  Will check his labs today and rtc in 6 months unless concerns.   Flu shot today

## 2020-03-24 NOTE — Progress Notes (Signed)
° °  Subjective:    Patient ID: [name], male    DOB: 1980-05-31, 39 y.o.   MRN: [id number]  HPI Here for follow up of HIV He continues on Genvoya with no missed doses.  No new health issues.  Had his COVID vaccines and to get his booster soon.  No associated n/v/d.    Review of Systems  Constitutional: Negative for unexpected weight change.  Gastrointestinal: Negative for diarrhea.  Skin: Negative for rash.       Objective:   Physical Exam Eyes:     General: No scleral icterus. Pulmonary:     Effort: Pulmonary effort is normal.  Neurological:     General: No focal deficit present.     Mental Status: He is alert.  Psychiatric:        Mood and Affect: Mood normal.    SH: + tobacco       Assessment & Plan:

## 2020-03-24 NOTE — Assessment & Plan Note (Signed)
Will check his creat, LFTs

## 2020-03-24 NOTE — Addendum Note (Signed)
Addended by: Harley Alto on: 03/24/2020 12:01 PM   Modules accepted: Orders

## 2020-03-25 LAB — T-HELPER CELL (CD4) - (RCID CLINIC ONLY)
CD4 % Helper T Cell: 53 % (ref 33–65)
CD4 T Cell Abs: 1127 /uL (ref 400–1790)

## 2020-03-27 LAB — CBC WITH DIFFERENTIAL/PLATELET
Absolute Monocytes: 447 cells/uL (ref 200–950)
Basophils Absolute: 81 cells/uL (ref 0–200)
Basophils Relative: 1.4 %
Eosinophils Absolute: 197 cells/uL (ref 15–500)
Eosinophils Relative: 3.4 %
HCT: 43.7 % (ref 38.5–50.0)
Hemoglobin: 14.9 g/dL (ref 13.2–17.1)
Lymphs Abs: 2274 cells/uL (ref 850–3900)
MCH: 31.7 pg (ref 27.0–33.0)
MCHC: 34.1 g/dL (ref 32.0–36.0)
MCV: 93 fL (ref 80.0–100.0)
MPV: 9.2 fL (ref 7.5–12.5)
Monocytes Relative: 7.7 %
Neutro Abs: 2801 cells/uL (ref 1500–7800)
Neutrophils Relative %: 48.3 %
Platelets: 441 10*3/uL — ABNORMAL HIGH (ref 140–400)
RBC: 4.7 10*6/uL (ref 4.20–5.80)
RDW: 10.6 % — ABNORMAL LOW (ref 11.0–15.0)
Total Lymphocyte: 39.2 %
WBC: 5.8 10*3/uL (ref 3.8–10.8)

## 2020-03-27 LAB — HIV-1 RNA QUANT-NO REFLEX-BLD
HIV 1 RNA Quant: 22 Copies/mL — ABNORMAL HIGH
HIV-1 RNA Quant, Log: 1.33 Log cps/mL — ABNORMAL HIGH

## 2020-03-27 LAB — COMPLETE METABOLIC PANEL WITH GFR
AG Ratio: 1.7 (calc) (ref 1.0–2.5)
ALT: 17 U/L (ref 9–46)
AST: 13 U/L (ref 10–40)
Albumin: 4.8 g/dL (ref 3.6–5.1)
Alkaline phosphatase (APISO): 55 U/L (ref 36–130)
BUN: 18 mg/dL (ref 7–25)
CO2: 25 mmol/L (ref 20–32)
Calcium: 9.9 mg/dL (ref 8.6–10.3)
Chloride: 104 mmol/L (ref 98–110)
Creat: 1.21 mg/dL (ref 0.60–1.35)
GFR, Est African American: 86 mL/min/{1.73_m2} (ref >=60)
GFR, Est Non African American: 74 mL/min/{1.73_m2} (ref >=60)
Globulin: 2.8 g/dL (calc) (ref 1.9–3.7)
Glucose, Bld: 82 mg/dL (ref 65–99)
Potassium: 3.9 mmol/L (ref 3.5–5.3)
Sodium: 139 mmol/L (ref 135–146)
Total Bilirubin: 0.7 mg/dL (ref 0.2–1.2)
Total Protein: 7.6 g/dL (ref 6.1–8.1)

## 2020-03-27 LAB — RPR: RPR Ser Ql: REACTIVE — AB

## 2020-03-27 LAB — RPR TITER: RPR Titer: 1:1 {titer} — ABNORMAL HIGH

## 2020-03-27 LAB — LIPID PANEL
Cholesterol: 208 mg/dL — ABNORMAL HIGH (ref <200)
HDL: 40 mg/dL (ref >=40)
LDL Cholesterol (Calc): 148 mg/dL (calc) — ABNORMAL HIGH
Non-HDL Cholesterol (Calc): 168 mg/dL (calc) — ABNORMAL HIGH (ref <130)
Total CHOL/HDL Ratio: 5.2 (calc) — ABNORMAL HIGH (ref <5.0)
Triglycerides: 96 mg/dL (ref <150)

## 2020-03-27 LAB — FLUORESCENT TREPONEMAL AB(FTA)-IGG-BLD: Fluorescent Treponemal ABS: REACTIVE — AB

## 2020-04-29 ENCOUNTER — Other Ambulatory Visit: Payer: Self-pay | Admitting: Internal Medicine

## 2020-04-29 DIAGNOSIS — B20 Human immunodeficiency virus [HIV] disease: Secondary | ICD-10-CM

## 2020-07-24 ENCOUNTER — Other Ambulatory Visit: Payer: Self-pay | Admitting: Internal Medicine

## 2020-07-24 DIAGNOSIS — I10 Essential (primary) hypertension: Secondary | ICD-10-CM

## 2020-08-23 ENCOUNTER — Other Ambulatory Visit: Payer: Self-pay | Admitting: Internal Medicine

## 2020-09-17 ENCOUNTER — Other Ambulatory Visit: Payer: Self-pay | Admitting: Internal Medicine

## 2020-09-17 DIAGNOSIS — B20 Human immunodeficiency virus [HIV] disease: Secondary | ICD-10-CM

## 2020-09-17 DIAGNOSIS — G4700 Insomnia, unspecified: Secondary | ICD-10-CM

## 2020-09-29 ENCOUNTER — Other Ambulatory Visit: Payer: Self-pay

## 2020-09-29 ENCOUNTER — Encounter: Payer: Self-pay | Admitting: Internal Medicine

## 2020-09-29 ENCOUNTER — Ambulatory Visit (INDEPENDENT_AMBULATORY_CARE_PROVIDER_SITE_OTHER): Payer: Medicare Other | Admitting: Internal Medicine

## 2020-09-29 VITALS — BP 152/94 | HR 82 | Temp 98.5°F | Wt 164.0 lb

## 2020-09-29 DIAGNOSIS — B20 Human immunodeficiency virus [HIV] disease: Secondary | ICD-10-CM

## 2020-09-29 DIAGNOSIS — Z720 Tobacco use: Secondary | ICD-10-CM

## 2020-09-29 NOTE — Assessment & Plan Note (Signed)
He continues to do well, no changes.  Will check labs today. Uses condoms always rtc in 6 months.

## 2020-09-29 NOTE — Assessment & Plan Note (Signed)
Discussed need to quit.

## 2020-09-29 NOTE — Progress Notes (Signed)
   Subjective:    Patient ID: [name], male    DOB: 03-Jul-1980, 40 y.o.   MRN: [id number]  HPI Here for follow up of HIV He is on Genvoya and denies any missed doses.  He continues to do well and denies any missed doses.  No labs prior to the visit.  No issues with getting, taking or tolerating the medication.     Review of Systems  Constitutional:  Negative for fatigue.  Gastrointestinal:  Negative for diarrhea and nausea.      Objective:   Physical Exam Eyes:     General: No scleral icterus. Pulmonary:     Effort: Pulmonary effort is normal.  Neurological:     Mental Status: He is alert.          Assessment & Plan:

## 2020-09-30 LAB — T-HELPER CELL (CD4) - (RCID CLINIC ONLY)
CD4 % Helper T Cell: 51 % (ref 33–65)
CD4 T Cell Abs: 1291 /uL (ref 400–1790)

## 2020-10-02 LAB — HIV-1 RNA QUANT-NO REFLEX-BLD
HIV 1 RNA Quant: <20 Copies/mL — ABNORMAL HIGH
HIV-1 RNA Quant, Log: <1.3 Log cps/mL — ABNORMAL HIGH

## 2020-10-20 ENCOUNTER — Other Ambulatory Visit: Payer: Self-pay | Admitting: Internal Medicine

## 2020-10-20 DIAGNOSIS — I10 Essential (primary) hypertension: Secondary | ICD-10-CM

## 2020-12-15 ENCOUNTER — Other Ambulatory Visit: Payer: Self-pay | Admitting: Internal Medicine

## 2020-12-15 DIAGNOSIS — I10 Essential (primary) hypertension: Secondary | ICD-10-CM

## 2020-12-15 NOTE — Telephone Encounter (Signed)
Please advise on refill.

## 2021-01-24 ENCOUNTER — Other Ambulatory Visit: Payer: Self-pay | Admitting: Internal Medicine

## 2021-01-24 DIAGNOSIS — B20 Human immunodeficiency virus [HIV] disease: Secondary | ICD-10-CM

## 2021-01-24 DIAGNOSIS — G4700 Insomnia, unspecified: Secondary | ICD-10-CM

## 2021-01-25 NOTE — Telephone Encounter (Signed)
Please advise on Trazodone refill

## 2021-01-26 ENCOUNTER — Other Ambulatory Visit: Payer: Self-pay | Admitting: Internal Medicine

## 2021-01-26 DIAGNOSIS — G4700 Insomnia, unspecified: Secondary | ICD-10-CM

## 2021-01-26 DIAGNOSIS — B20 Human immunodeficiency virus [HIV] disease: Secondary | ICD-10-CM

## 2021-01-26 MED ORDER — TRAZODONE HCL 50 MG PO TABS: 50.0000 mg | ORAL_TABLET | Freq: Every evening | ORAL | 11 refills | Status: DC | PRN

## 2021-02-02 ENCOUNTER — Other Ambulatory Visit: Payer: Self-pay | Admitting: Internal Medicine

## 2021-02-02 DIAGNOSIS — I10 Essential (primary) hypertension: Secondary | ICD-10-CM

## 2021-02-02 NOTE — Telephone Encounter (Signed)
Please advise if okay to refill. 

## 2021-02-08 ENCOUNTER — Encounter: Payer: Medicare Other | Admitting: Student

## 2021-02-15 ENCOUNTER — Ambulatory Visit (INDEPENDENT_AMBULATORY_CARE_PROVIDER_SITE_OTHER): Payer: Medicare Other | Admitting: Internal Medicine

## 2021-02-15 ENCOUNTER — Encounter: Payer: Self-pay | Admitting: Internal Medicine

## 2021-02-15 VITALS — BP 140/86 | HR 77 | Temp 98.2°F | Wt 162.2 lb

## 2021-02-15 DIAGNOSIS — E785 Hyperlipidemia, unspecified: Secondary | ICD-10-CM | POA: Insufficient documentation

## 2021-02-15 DIAGNOSIS — B20 Human immunodeficiency virus [HIV] disease: Secondary | ICD-10-CM

## 2021-02-15 DIAGNOSIS — R899 Unspecified abnormal finding in specimens from other organs, systems and tissues: Secondary | ICD-10-CM | POA: Diagnosis not present

## 2021-02-15 DIAGNOSIS — R7303 Prediabetes: Secondary | ICD-10-CM

## 2021-02-15 DIAGNOSIS — Z0000 Encounter for general adult medical examination without abnormal findings: Secondary | ICD-10-CM

## 2021-02-15 DIAGNOSIS — F17210 Nicotine dependence, cigarettes, uncomplicated: Secondary | ICD-10-CM | POA: Diagnosis not present

## 2021-02-15 DIAGNOSIS — F1010 Alcohol abuse, uncomplicated: Secondary | ICD-10-CM | POA: Diagnosis not present

## 2021-02-15 DIAGNOSIS — F1414 Cocaine abuse with cocaine-induced mood disorder: Secondary | ICD-10-CM

## 2021-02-15 DIAGNOSIS — I10 Essential (primary) hypertension: Secondary | ICD-10-CM

## 2021-02-15 DIAGNOSIS — Z720 Tobacco use: Secondary | ICD-10-CM

## 2021-02-15 DIAGNOSIS — E119 Type 2 diabetes mellitus without complications: Secondary | ICD-10-CM | POA: Diagnosis not present

## 2021-02-15 LAB — POCT GLYCOSYLATED HEMOGLOBIN (HGB A1C): Hemoglobin A1C: 4.5 % (ref 4.0–5.6)

## 2021-02-15 LAB — GLUCOSE, CAPILLARY: Glucose-Capillary: 93 mg/dL (ref 70–99)

## 2021-02-15 MED ORDER — AMLODIPINE BESYLATE 10 MG PO TABS: 10.0000 mg | ORAL_TABLET | Freq: Every day | ORAL | 3 refills | Status: DC

## 2021-02-15 NOTE — Assessment & Plan Note (Addendum)
Vitals:   02/15/21 1407  BP: 140/86  Pulse: 77  Temp: 98.2 F (36.8 C)  SpO2: 100%   BP 140/86 today. Pt states excellent medication compliance, though he has not taken it in 10 days because he has been out. BP has been stable up until now on chart review. Pt denies headaches, chest pain, vision changes,and  palpitations. He has a poor diet and does not exercise; he is counseled on the importance of a healthy diet and daily exercise. He is advised to keep a BP log as well.   Plan Refill amlodipine 10 mg qd Continue lifestyle modifications BP log

## 2021-02-15 NOTE — Assessment & Plan Note (Signed)
Drinks 1 40 oz beer daily. He is not interested in alcohol cessation today. CTM.

## 2021-02-15 NOTE — Progress Notes (Signed)
CC: establish care at clinic and HTN medication refill   HPI:  [name] is a 40 y.o. male with a PMHx stated below and presents today for stated above.  Please see problem based assessment and plan for additional details.  Past Medical History:  Diagnosis Date   Depression    HIV (human immunodeficiency virus infection) (HCC)    HIV infection (HCC)    Hx of suicide attempt 07/28/2011   Age 40.  Pt stated he wanted to kill himself and he was admitted to a mental health facility for a week or so.    Hypertension    Psychiatric problem     Current Outpatient Medications on File Prior to Visit  Medication Sig Dispense Refill   amLODipine (NORVASC) 10 MG tablet TAKE ONE TABLET BY MOUTH DAILY 30 tablet 1   GENVOYA 150-150-200-10 MG TABS tablet TAKE ONE TABLET BY MOUTH DAILY 30 tablet 2   hydrOXYzine (ATARAX/VISTARIL) 10 MG tablet TAKE ONE TABLET BY MOUTH THREE TIMES A DAY AS NEEDED FOR ITCHING 30 tablet 0   traZODone (DESYREL) 50 MG tablet Take 1 tablet (50 mg total) by mouth at bedtime as needed. for sleep 30 tablet 11   No current facility-administered medications on file prior to visit.    Family History  Problem Relation Age of Onset   Hypertension Mother    Hypertension Father    Heart attack Father 51    Social History   Socioeconomic History   Marital status: Married    Spouse name: Not on file   Number of children: Not on file   Years of education: Not on file   Highest education level: Not on file  Occupational History   Not on file  Tobacco Use   Smoking status: Every Day    Packs/day: 1.00    Years: 20.00    Pack years: 20.00    Types: Cigarettes   Smokeless tobacco: Never   Tobacco comments:    encouraged to quit to help bp as well  Vaping Use   Vaping Use: Never used  Substance and Sexual Activity   Alcohol use: Yes    Alcohol/week: 8.0 standard drinks    Types: 8 Cans of beer per week    Comment: 3 - 6 packs (12oz cans) per week   Drug  use: Yes    Frequency: 3.0 times per week    Types: Marijuana   Sexual activity: Yes    Partners: Male    Birth control/protection: None    Comment: declined condoms 09/2020  Other Topics Concern   Not on file  Social History Narrative   ** Merged History Encounter **       Social Determinants of Health   Financial Resource Strain: Not on file  Food Insecurity: Not on file  Transportation Needs: Not on file  Physical Activity: Not on file  Stress: Not on file  Social Connections: Not on file  Intimate Partner Violence: Not on file    Review of Systems: ROS negative except for what is noted on the assessment and plan.  Vitals:   02/15/21 1407  BP: 140/86  Pulse: 77  Temp: 98.2 F (36.8 C)  TempSrc: Oral  SpO2: 100%  Weight: 162 lb 3.2 oz (73.6 kg)     Physical Exam: Constitutional: alert, well-appearing, in NAD HENT: normocephalic, atraumatic, mucous membranes moist, no palpable lymphadenopathy  Eyes: conjunctiva non-erythematous, EOMI Cardiovascular: RRR, no m/r/g, non-edematous bilateral LE Pulmonary/Chest: normal work of  breathing on room air, LCTAB Abdominal: soft, non-tender to palpation, non-distended MSK: normal bulk and tone Neurological: A&O x 3, 5/5 strength in bilateral upper and lower extremities  Skin: warm and dry Psych: normal behavior, normal affect   Assessment & Plan:   See Encounters Tab for problem based charting.  Patient seen with Dr. Delrae Alfred, MD  Internal Medicine Resident, PGY-1 [facility]  2:44 PM, 02/15/2021

## 2021-02-15 NOTE — Assessment & Plan Note (Signed)
Smoking 1/2 ppd x 22 years. He is not interested in smoking cessation conversation today. CTM.

## 2021-02-15 NOTE — Assessment & Plan Note (Addendum)
-   Influenza vaccine offered; pt reports he will get it during his ID appt in December.  -A1c ordered -Lipid panel ordered given 03/2020 LDL 148, not on a statin, and ASCVD risk 11.8%. F/u on lipid panel, and will start statin therapy if appropriate

## 2021-02-15 NOTE — Assessment & Plan Note (Signed)
Being appropriately managed and monitored by Dr Luciana Axe at infectious disease.   -Follow up appt with Dr Luciana Axe in December

## 2021-02-15 NOTE — Assessment & Plan Note (Signed)
Pt states he last snorted cocaine a couple days ago. He says he is not bothered by the amount he uses because "it is not too often." Resources have been provided to him in the past. I mentioned that we will continue to have a conversation about this, and he is agreeable. CTM.

## 2021-02-15 NOTE — Assessment & Plan Note (Addendum)
Lipid Panel     Component Value Date/Time   CHOL 208 (H) 03/24/2020 1142   TRIG 96 03/24/2020 1142   HDL 40 03/24/2020 1142   CHOLHDL 5.2 (H) 03/24/2020 1142   VLDL 26 01/25/2016 1545   LDLCALC 148 (H) 03/24/2020 1142   Elevated LDL, not on a statin, and ASCVD risk 11.8%. Has FHx of CVA and MI. Will repeat lipid panel today and start statin therapy if indicated.   Addendum: Lipid panel 02/16/21 with LDL 136, prescribed Atorvastatin 20

## 2021-02-15 NOTE — Patient Instructions (Addendum)
Thank you, [name] for allowing [place] to provide your care today. Today we discussed high blood pressure, cholesterol and A1c  Please continue to eat healthy foods, exercise daily and be active. I will call you if your labs require you to start any new medications.   I have ordered the following labs for you:  Lab Orders         Lipid Profile         POC Hbg A1C       Refills  Start the following medications: Meds ordered this encounter  Medications   amLODipine (NORVASC) 10 MG tablet    Sig: Take 1 tablet (10 mg total) by mouth daily.    Dispense:  90 tablet    Refill:  3     Follow up: 3 months   Should you have any questions or concerns please call the internal medicine clinic at [phone].     Gary Sacramento, MD  [name]

## 2021-02-16 LAB — LIPID PANEL
Chol/HDL Ratio: 4.9 ratio (ref 0.0–5.0)
Cholesterol, Total: 197 mg/dL (ref 100–199)
HDL: 40 mg/dL (ref >39)
LDL Chol Calc (NIH): 136 mg/dL — ABNORMAL HIGH (ref 0–99)
Triglycerides: 113 mg/dL (ref 0–149)
VLDL Cholesterol Cal: 21 mg/dL (ref 5–40)

## 2021-02-16 MED ORDER — ATORVASTATIN CALCIUM 20 MG PO TABS: 20.0000 mg | ORAL_TABLET | Freq: Every day | ORAL | 3 refills | Status: DC

## 2021-02-16 NOTE — Progress Notes (Signed)
Internal Medicine Clinic Attending  I saw and evaluated the patient.  I personally confirmed the key portions of the history and exam documented by Dr. Patel and I reviewed pertinent patient test results.  The assessment, diagnosis, and plan were formulated together and I agree with the documentation in the resident's note.  

## 2021-04-05 ENCOUNTER — Ambulatory Visit: Payer: Medicare Other | Admitting: Internal Medicine

## 2021-04-14 ENCOUNTER — Ambulatory Visit: Payer: Medicare Other | Admitting: Internal Medicine

## 2021-05-16 ENCOUNTER — Other Ambulatory Visit: Payer: Self-pay | Admitting: Internal Medicine

## 2021-05-16 DIAGNOSIS — B20 Human immunodeficiency virus [HIV] disease: Secondary | ICD-10-CM

## 2021-05-16 NOTE — Telephone Encounter (Signed)
Left VM asking patient to return my call. Overdue for office follow up.

## 2021-05-18 ENCOUNTER — Ambulatory Visit: Payer: Medicare Other | Admitting: Infectious Diseases

## 2021-05-18 NOTE — Telephone Encounter (Signed)
Appt 2/9

## 2021-05-19 ENCOUNTER — Other Ambulatory Visit: Payer: Self-pay

## 2021-05-19 ENCOUNTER — Ambulatory Visit (INDEPENDENT_AMBULATORY_CARE_PROVIDER_SITE_OTHER): Payer: Medicare Other | Admitting: Infectious Diseases

## 2021-05-19 ENCOUNTER — Encounter: Payer: Self-pay | Admitting: Infectious Diseases

## 2021-05-19 VITALS — BP 142/90 | HR 75 | Temp 97.4°F | Wt 159.0 lb

## 2021-05-19 DIAGNOSIS — B20 Human immunodeficiency virus [HIV] disease: Secondary | ICD-10-CM

## 2021-05-19 MED ORDER — GENVOYA 150-150-200-10 MG PO TABS: 1.0000 | ORAL_TABLET | Freq: Every day | ORAL | 11 refills | Status: DC
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
## 2021-05-19 NOTE — Assessment & Plan Note (Signed)
Doing well long term on Genvoya once daily. He has had tremendous stress being out of his medication the last 4 days. Will give him more refills as he is highly adherent and has a great track record. Defer labs today and he can come back in 76m prior to visit with Dr. Luciana Axe.

## 2021-05-19 NOTE — Patient Instructions (Addendum)
Nice to meet you today -   Please pick up your Genvoya today, take your dose before dinner tonight then start tomorrow at your regular daily time.   Please schedule a follow up lab visit and office visit 2 weeks later with Dr. Luciana Axe in 6 months

## 2021-05-23 ENCOUNTER — Encounter: Payer: Medicare Other | Admitting: Internal Medicine

## 2021-06-01 ENCOUNTER — Encounter: Payer: Medicare Other | Admitting: Internal Medicine

## 2021-06-09 ENCOUNTER — Ambulatory Visit (INDEPENDENT_AMBULATORY_CARE_PROVIDER_SITE_OTHER): Payer: Medicare Other | Admitting: Internal Medicine

## 2021-06-09 ENCOUNTER — Other Ambulatory Visit: Payer: Self-pay

## 2021-06-09 VITALS — BP 126/79 | HR 85 | Temp 98.0°F | Ht 68.0 in | Wt 151.1 lb

## 2021-06-09 DIAGNOSIS — E785 Hyperlipidemia, unspecified: Secondary | ICD-10-CM | POA: Diagnosis not present

## 2021-06-09 DIAGNOSIS — Z0000 Encounter for general adult medical examination without abnormal findings: Secondary | ICD-10-CM

## 2021-06-09 DIAGNOSIS — G720 Drug-induced myopathy: Secondary | ICD-10-CM | POA: Diagnosis not present

## 2021-06-09 DIAGNOSIS — L299 Pruritus, unspecified: Secondary | ICD-10-CM

## 2021-06-09 DIAGNOSIS — I10 Essential (primary) hypertension: Secondary | ICD-10-CM | POA: Diagnosis not present

## 2021-06-09 DIAGNOSIS — T466X5A Adverse effect of antihyperlipidemic and antiarteriosclerotic drugs, initial encounter: Secondary | ICD-10-CM

## 2021-06-09 DIAGNOSIS — B20 Human immunodeficiency virus [HIV] disease: Secondary | ICD-10-CM

## 2021-06-09 MED ORDER — ROSUVASTATIN CALCIUM 10 MG PO TABS: 10.0000 mg | ORAL_TABLET | Freq: Every day | ORAL | 3 refills | Status: DC

## 2021-06-09 MED ORDER — HYDROXYZINE HCL 10 MG PO TABS: 10.0000 mg | ORAL_TABLET | Freq: Once | ORAL | 1 refills | Status: DC | PRN

## 2021-06-09 NOTE — Patient Instructions (Addendum)
Please stop taking Atorvastatin and start taking Rosuvastatin instead ? ?I will call you with lab results. ? ?See you in 3 months! ?

## 2021-06-09 NOTE — Addendum Note (Signed)
Addended by: Bufford Spikes on: 06/09/2021 10:43 AM ? ? Modules accepted: Orders ? ?

## 2021-06-09 NOTE — Assessment & Plan Note (Signed)
Doing well on Genvoya, and is highly adherent to medication without any reported adverse effects.  ? ?-Continue f/u with [facility]  ?

## 2021-06-09 NOTE — Assessment & Plan Note (Signed)
Flu and tetanus vaccine offered, he declined ?Encouraged him to get COVID booster at his local pharmacy or health dept ?He would like to defer urine mico to next visit  ?

## 2021-06-09 NOTE — Assessment & Plan Note (Addendum)
Lipid Panel 02/16/21 ?   ?Component Value Date/Time  ? CHOL 197 02/15/2021 1458  ? TRIG 113 02/15/2021 1458  ? HDL 40 02/15/2021 1458  ? CHOLHDL 4.9 02/15/2021 1458  ? CHOLHDL 5.2 (H) 03/24/2020 1142  ? VLDL 26 01/25/2016 1545  ? LDLCALC 136 (H) 02/15/2021 1458  ? LDLCALC 148 (H) 03/24/2020 1142  ? LABVLDL 21 02/15/2021 1458  ? ?Elevated LDL as seen above on lipid panel from 4 months ago. He is compliant with Atorvastatin 20 mg daily but does complain of myopathy since starting medication ~3 mo ago. No other complaints. Will d/c atorvastatin d/t statin associated myopathy and start rosuvastatin 10 mg daily. He is agreeable to this plain.  ? ?-Stop Atorvastatin  ?-Start Rosuvastatin 10 mg daily  ?-Repeat lipid panel today; except a 30-35% decrease in LDL.  ?-Will check CK today given myopathy  ? ?Addendum: ?CK and AST/ALT wnl  ?LDL improved from 136 to 119, but still not at goal (<100) in setting of previous intermittently using atorvastatin. Will continue with pravastatin at this time, repeat in 3 months and add Zetia if LDL not at goal; pt is agreeable to this plan.  ?

## 2021-06-09 NOTE — Progress Notes (Addendum)
? ?CC: 3 mo f/u for BP ? ?HPI: ? ?Mr.MICK TANGUMA is a 41 y.o. male with a PMHx stated below and presents today for stated above. Please see the Encounters tab for problem-based Assessment & Plan for additional details.  ? ?Past Medical History:  ?Diagnosis Date  ? Depression   ? HIV (human immunodeficiency virus infection) (HCC)   ? HIV infection (HCC)   ? Hx of suicide attempt 07/28/2011  ? Age 41.  Pt stated he wanted to kill himself and he was admitted to a mental health facility for a week or so.   ? Hypertension   ? Psychiatric problem   ? ? ?Current Outpatient Medications on File Prior to Visit  ?Medication Sig Dispense Refill  ? amLODipine (NORVASC) 10 MG tablet Take 1 tablet (10 mg total) by mouth daily. 90 tablet 3  ? elvitegravir-cobicistat-emtricitabine-tenofovir (GENVOYA) 150-150-200-10 MG TABS tablet Take 1 tablet by mouth daily. 30 tablet 11  ? traZODone (DESYREL) 50 MG tablet Take 1 tablet (50 mg total) by mouth at bedtime as needed. for sleep 30 tablet 11  ? ?No current facility-administered medications on file prior to visit.  ? ? ?Family History  ?Problem Relation Age of Onset  ? Hypertension Mother   ? Hypertension Father   ? Heart attack Father 71  ? ? ?Social History  ? ?Socioeconomic History  ? Marital status: Married  ?  Spouse name: Not on file  ? Number of children: Not on file  ? Years of education: Not on file  ? Highest education level: Not on file  ?Occupational History  ? Not on file  ?Tobacco Use  ? Smoking status: Every Day  ?  Packs/day: 0.50  ?  Years: 20.00  ?  Pack years: 10.00  ?  Types: Cigarettes  ? Smokeless tobacco: Never  ? Tobacco comments:  ?  encouraged to quit to help bp as well  ?Vaping Use  ? Vaping Use: Never used  ?Substance and Sexual Activity  ? Alcohol use: Yes  ?  Alcohol/week: 8.0 standard drinks  ?  Types: 8 Cans of beer per week  ?  Comment: 3 - 6 packs (12oz cans) per week  ? Drug use: Yes  ?  Frequency: 3.0 times per week  ?  Types: Marijuana, Cocaine  ?  Sexual activity: Yes  ?  Partners: Male  ?  Birth control/protection: None  ?  Comment: declined condoms  ?Other Topics Concern  ? Not on file  ?Social History Narrative  ? ** Merged History Encounter **  ?    ? ?Social Determinants of Health  ? ?Financial Resource Strain: Not on file  ?Food Insecurity: Not on file  ?Transportation Needs: Not on file  ?Physical Activity: Not on file  ?Stress: Not on file  ?Social Connections: Not on file  ?Intimate Partner Violence: Not on file  ? ? ?Review of Systems: ?ROS negative except for what is noted on the assessment and plan. ? ?Vitals:  ? 06/09/21 0853  ?BP: 126/79  ?Pulse: 85  ?Temp: 98 ?F (36.7 ?C)  ?TempSrc: Oral  ?SpO2: 100%  ?Weight: 151 lb 1.6 oz (68.5 kg)  ?Height: 5\' 8"  (1.727 m)  ? ? ? ?Physical Exam: ?Constitutional: alert, well-appearing, in NAD ?HENT: normocephalic, atraumatic, mucous membranes moist, no angioedema  ?Eyes: conjunctiva non-erythematous, EOMI ?Cardiovascular: RRR, no m/r/g, non-edematous bilateral LE ?Pulmonary/Chest: normal work of breathing on RA, LCTAB ?Abdominal: soft, non-tender to palpation, non-distended ?MSK: normal bulk and tone  ?  Neurological: A&O x 3 and follows commands  ?Skin: warm and dry ?Psych: normal behavior, normal affect  ? ? ?Assessment & Plan:  ? ?See Encounters Tab for problem based charting. ? ?Patient discussed with Dr. Heide Spark ? ?Carmel Sacramento, MD  ?Internal Medicine Resident, PGY-1 ?[facility]  ?

## 2021-06-09 NOTE — Assessment & Plan Note (Addendum)
Today's Vitals  ? 06/09/21 0853  ?BP: 126/79  ?Pulse: 85  ?Temp: 98 ?F (36.7 ?C)  ?TempSrc: Oral  ?SpO2: 100%  ?Weight: 151 lb 1.6 oz (68.5 kg)  ?Height: 5\' 8"  (1.727 m)  ?PainSc: 0-No pain  ? ?Body mass index is 22.97 kg/m?. ? ?BP at goal (< 130/80). Reports good poor medication compliance. Tolerating medication without adverse effects. Denies headaches, vision changes, lightheadedness, chest pain, SHOB, leg swelling or changes in speech. Exam benign and vitals otherwise wnl. Counseled on the importance of daily exercise, low salt diet, and weight loss. ? ?Continue amlodipine 10 mg daily  ?Continue lifestyle changes ?F/u on CMP  ? ?Addendum: ?Cr 1.32 (baseline 1.1-1.2). He reports poor fluid intake lately. Not on any nephrotoxins. Encouraged him to drink more water, and he is agreeable to this. Repeat BMP during next visit.  ?

## 2021-06-10 ENCOUNTER — Other Ambulatory Visit: Payer: Medicare Other

## 2021-06-10 NOTE — Progress Notes (Signed)
Internal Medicine Clinic Attending  Case discussed with Dr. Patel  At the time of the visit.  We reviewed the resident's history and exam and pertinent patient test results.  I agree with the assessment, diagnosis, and plan of care documented in the resident's note.  

## 2021-06-13 ENCOUNTER — Other Ambulatory Visit (INDEPENDENT_AMBULATORY_CARE_PROVIDER_SITE_OTHER): Payer: Medicare Other

## 2021-06-13 ENCOUNTER — Other Ambulatory Visit: Payer: Self-pay

## 2021-06-13 DIAGNOSIS — E785 Hyperlipidemia, unspecified: Secondary | ICD-10-CM

## 2021-06-13 DIAGNOSIS — I10 Essential (primary) hypertension: Secondary | ICD-10-CM

## 2021-06-13 DIAGNOSIS — Z0000 Encounter for general adult medical examination without abnormal findings: Secondary | ICD-10-CM

## 2021-06-13 DIAGNOSIS — B20 Human immunodeficiency virus [HIV] disease: Secondary | ICD-10-CM

## 2021-06-13 DIAGNOSIS — G720 Drug-induced myopathy: Secondary | ICD-10-CM

## 2021-06-14 LAB — CMP14 + ANION GAP
ALT: 15 IU/L (ref 0–44)
AST: 13 IU/L (ref 0–40)
Albumin/Globulin Ratio: 1.7 (ref 1.2–2.2)
Albumin: 4.9 g/dL (ref 4.0–5.0)
Alkaline Phosphatase: 67 IU/L (ref 44–121)
Anion Gap: 16 mmol/L (ref 10.0–18.0)
BUN/Creatinine Ratio: 17 (ref 9–20)
BUN: 22 mg/dL (ref 6–24)
Bilirubin Total: 0.4 mg/dL (ref 0.0–1.2)
CO2: 22 mmol/L (ref 20–29)
Calcium: 10.2 mg/dL (ref 8.7–10.2)
Chloride: 98 mmol/L (ref 96–106)
Creatinine, Ser: 1.32 mg/dL — ABNORMAL HIGH (ref 0.76–1.27)
Globulin, Total: 2.9 g/dL (ref 1.5–4.5)
Glucose: 86 mg/dL (ref 70–99)
Potassium: 4.8 mmol/L (ref 3.5–5.2)
Sodium: 136 mmol/L (ref 134–144)
Total Protein: 7.8 g/dL (ref 6.0–8.5)
eGFR: 69 mL/min/{1.73_m2} (ref >59)

## 2021-06-14 LAB — LIPID PANEL
Chol/HDL Ratio: 3.7 ratio (ref 0.0–5.0)
Cholesterol, Total: 181 mg/dL (ref 100–199)
HDL: 49 mg/dL (ref >39)
LDL Chol Calc (NIH): 119 mg/dL — ABNORMAL HIGH (ref 0–99)
Triglycerides: 69 mg/dL (ref 0–149)
VLDL Cholesterol Cal: 13 mg/dL (ref 5–40)

## 2021-06-14 LAB — CK: Total CK: 89 U/L (ref 49–439)

## 2021-07-20 ENCOUNTER — Other Ambulatory Visit: Payer: Self-pay | Admitting: Internal Medicine

## 2021-07-20 DIAGNOSIS — L299 Pruritus, unspecified: Secondary | ICD-10-CM

## 2021-08-16 ENCOUNTER — Ambulatory Visit (INDEPENDENT_AMBULATORY_CARE_PROVIDER_SITE_OTHER): Payer: Medicare Other | Admitting: Internal Medicine

## 2021-08-16 ENCOUNTER — Encounter: Payer: Self-pay | Admitting: Internal Medicine

## 2021-08-16 DIAGNOSIS — I10 Essential (primary) hypertension: Secondary | ICD-10-CM

## 2021-08-16 DIAGNOSIS — L299 Pruritus, unspecified: Secondary | ICD-10-CM | POA: Diagnosis not present

## 2021-08-16 DIAGNOSIS — Z0000 Encounter for general adult medical examination without abnormal findings: Secondary | ICD-10-CM

## 2021-08-16 DIAGNOSIS — E785 Hyperlipidemia, unspecified: Secondary | ICD-10-CM | POA: Diagnosis not present

## 2021-08-16 DIAGNOSIS — G4700 Insomnia, unspecified: Secondary | ICD-10-CM | POA: Diagnosis not present

## 2021-08-16 DIAGNOSIS — F17210 Nicotine dependence, cigarettes, uncomplicated: Secondary | ICD-10-CM | POA: Diagnosis not present

## 2021-08-16 DIAGNOSIS — B20 Human immunodeficiency virus [HIV] disease: Secondary | ICD-10-CM | POA: Diagnosis not present

## 2021-08-16 MED ORDER — GENVOYA 150-150-200-10 MG PO TABS: 1.0000 | ORAL_TABLET | Freq: Every day | ORAL | 0 refills | Status: DC

## 2021-08-16 MED ORDER — AMLODIPINE BESYLATE 10 MG PO TABS: 10.0000 mg | ORAL_TABLET | Freq: Every day | ORAL | 3 refills | Status: DC

## 2021-08-16 MED ORDER — TRAZODONE HCL 50 MG PO TABS: 50.0000 mg | ORAL_TABLET | Freq: Every evening | ORAL | 11 refills | Status: DC | PRN

## 2021-08-16 MED ORDER — ROSUVASTATIN CALCIUM 10 MG PO TABS: 10.0000 mg | ORAL_TABLET | Freq: Every day | ORAL | 3 refills | Status: DC

## 2021-08-16 MED ORDER — HYDROXYZINE HCL 10 MG PO TABS: ORAL_TABLET | ORAL | 0 refills | Status: DC

## 2021-08-16 NOTE — Assessment & Plan Note (Addendum)
Patient is due for influenza and tetanus vaccine.  Declines at this time. ?

## 2021-08-16 NOTE — Patient Instructions (Signed)
Thank you, [name] for allowing [place] to provide your care today. Today we discussed medication refill.   ? ?I have ordered the following labs for you: ? ?Lab Orders  ?No laboratory test(s) ordered today  ?  ? ?Tests ordered today: ? ?None ? ?Referrals ordered today:  ? ?Referral Orders  ?No referral(s) requested today  ?  ? ?I have ordered the following medication/changed the following medications:  ? ?Stop the following medications: ?Medications Discontinued During This Encounter  ?Medication Reason  ? traZODone (DESYREL) 50 MG tablet Reorder  ? amLODipine (NORVASC) 10 MG tablet Reorder  ? elvitegravir-cobicistat-emtricitabine-tenofovir (GENVOYA) 150-150-200-10 MG TABS tablet Reorder  ? rosuvastatin (CRESTOR) 10 MG tablet Reorder  ? hydrOXYzine (ATARAX) 10 MG tablet Reorder  ?  ? ?Start the following medications: ?Meds ordered this encounter  ?Medications  ? amLODipine (NORVASC) 10 MG tablet  ?  Sig: Take 1 tablet (10 mg total) by mouth daily.  ?  Dispense:  90 tablet  ?  Refill:  3  ? elvitegravir-cobicistat-emtricitabine-tenofovir (GENVOYA) 150-150-200-10 MG TABS tablet  ?  Sig: Take 1 tablet by mouth daily.  ?  Dispense:  90 tablet  ?  Refill:  0  ? rosuvastatin (CRESTOR) 10 MG tablet  ?  Sig: Take 1 tablet (10 mg total) by mouth daily.  ?  Dispense:  90 tablet  ?  Refill:  3  ? traZODone (DESYREL) 50 MG tablet  ?  Sig: Take 1 tablet (50 mg total) by mouth at bedtime as needed. for sleep  ?  Dispense:  30 tablet  ?  Refill:  11  ? hydrOXYzine (ATARAX) 10 MG tablet  ?  Sig: TAKE ONE TABLET BY MOUTH ONCE AS NEEDED FOR UP TO ONE DOSE FOR ITCHING OR ANXIETY  ?  Dispense:  30 tablet  ?  Refill:  0  ?  ? ?Follow up: 6 months  ? ?Remember: Continue taking all your medications as directed ? ?Should you have any questions or concerns please call the internal medicine clinic at [phone].   ? ?Dellis Filbert, MD ?[facility] ? ? ?

## 2021-08-16 NOTE — Assessment & Plan Note (Addendum)
Home medication include amlodipine 10 mg daily.  Blood pressure at goal today of 126/98.  Patient denies any complaints.  Patient due for urine microalbumin; he declines at this time and prefers to return in a few days to provide urine sample. ? ? ?P: ?-Continue regimen ?-Lab only appointment in 3 days ?

## 2021-08-16 NOTE — Assessment & Plan Note (Addendum)
Home regimen includes rosuvastatin 10 mg daily.  On prior office visit patient endorsed myopathy when using atorvastatin.  Lipid profile obtained March 2023, LDL still elevated at 119.  CK levels were within normal limits at that time.  We will need to repeat lipid profile on following visit to assess if LDL is at goal on this new regimen of rosuvastatin. ? ?P: ?-Follow-up in 6 months; repeat lipid profile at that time ?

## 2021-08-16 NOTE — Progress Notes (Signed)
Internal Medicine Clinic Attending ? ?Case discussed with Dr. Ariwodo  At the time of the visit.  We reviewed the resident?s history and exam and pertinent patient test results.  I agree with the assessment, diagnosis, and plan of care documented in the resident?s note.  ?

## 2021-08-16 NOTE — Progress Notes (Signed)
? ?CC: Medication refill, blood pressure check ? ?HPI: ? ?[name] is a 41 y.o. male with a past medical history stated below and presents today for CC listed above. Please see problem based assessment and plan for additional details. ? ?Past Medical History:  ?Diagnosis Date  ? Depression   ? HIV (human immunodeficiency virus infection) (HCC)   ? HIV infection (HCC)   ? Hx of suicide attempt 07/28/2011  ? Age 60.  Pt stated he wanted to kill himself and he was admitted to a mental health facility for a week or so.   ? Hypertension   ? Psychiatric problem   ? ? ?No current outpatient medications on file prior to visit.  ? ?No current facility-administered medications on file prior to visit.  ? ? ?Family History  ?Problem Relation Age of Onset  ? Hypertension Mother   ? Hypertension Father   ? Heart attack Father 9  ? ? ?Social History  ? ?Socioeconomic History  ? Marital status: Married  ?  Spouse name: Not on file  ? Number of children: Not on file  ? Years of education: Not on file  ? Highest education level: Not on file  ?Occupational History  ? Not on file  ?Tobacco Use  ? Smoking status: Every Day  ?  Packs/day: 0.50  ?  Years: 20.00  ?  Pack years: 10.00  ?  Types: Cigarettes  ? Smokeless tobacco: Never  ? Tobacco comments:  ?  encouraged to quit to help bp as well  ?Vaping Use  ? Vaping Use: Never used  ?Substance and Sexual Activity  ? Alcohol use: Yes  ?  Alcohol/week: 8.0 standard drinks  ?  Types: 8 Cans of beer per week  ?  Comment: 3 - 6 packs (12oz cans) per week  ? Drug use: Yes  ?  Frequency: 3.0 times per week  ?  Types: Marijuana, Cocaine  ? Sexual activity: Yes  ?  Partners: Male  ?  Birth control/protection: None  ?  Comment: declined condoms  ?Other Topics Concern  ? Not on file  ?Social History Narrative  ? ** Merged History Encounter **  ?    ? ?Social Determinants of Health  ? ?Financial Resource Strain: Not on file  ?Food Insecurity: Not on file  ?Transportation Needs: Not on file   ?Physical Activity: Not on file  ?Stress: Not on file  ?Social Connections: Not on file  ?Intimate Partner Violence: Not on file  ? ? ?Review of Systems: ?ROS negative except for what is noted on the assessment and plan. ? ?Vitals:  ? 08/16/21 0839  ?BP: (!) 126/98  ?Pulse: 78  ?Temp: 98.4 ?F (36.9 ?C)  ?TempSrc: Oral  ?SpO2: 100%  ?Weight: 154 lb 12.8 oz (70.2 kg)  ?Height: 5\' 8"  (1.727 m)  ? ? ? ?Physical Exam: ?Constitutional: well-appearing middle-aged gentleman sitting in the chair, in no acute distress ?HENT: normocephalic atraumatic, mucous membranes moist ?Eyes: conjunctiva non-erythematous ?Neck: supple ?Cardiovascular: regular rate and rhythm, no m/r/g ?Pulmonary/Chest: normal work of breathing on room air, lungs clear to auscultation bilaterally ?Abdominal: soft, non-tender, non-distended ?MSK: normal bulk and tone ?Neurological: alert & oriented x 3, 5/5 strength in bilateral upper and lower extremities, abnormal gait ?Skin: warm and dry ?Psych: Normal mood ? ? ?Assessment & Plan:  ? ?See Encounters Tab for problem based charting. ? ?Patient discussed with Dr. ?Antony Contras, M.D. ?[facility] Internal Medicine, PGY-1 ?Pager: [phone], Phone: [phone] ?Date  08/16/2021 Time 8:56 AM  ?

## 2021-08-16 NOTE — Assessment & Plan Note (Addendum)
Medication include Genvoya; patient follows [facility]. ? ?P: ?-Continue regimen; medication refilled ?

## 2021-09-21 ENCOUNTER — Other Ambulatory Visit: Payer: Self-pay | Admitting: Internal Medicine

## 2021-09-21 DIAGNOSIS — L299 Pruritus, unspecified: Secondary | ICD-10-CM

## 2021-10-01 ENCOUNTER — Encounter: Payer: Self-pay | Admitting: *Deleted

## 2021-10-20 ENCOUNTER — Other Ambulatory Visit: Payer: Self-pay | Admitting: Internal Medicine

## 2021-10-20 DIAGNOSIS — L299 Pruritus, unspecified: Secondary | ICD-10-CM

## 2021-11-16 ENCOUNTER — Ambulatory Visit: Payer: Medicare Other

## 2021-11-16 ENCOUNTER — Other Ambulatory Visit: Payer: Medicare Other

## 2021-11-30 ENCOUNTER — Ambulatory Visit (INDEPENDENT_AMBULATORY_CARE_PROVIDER_SITE_OTHER): Payer: Medicare Other | Admitting: Internal Medicine

## 2021-11-30 ENCOUNTER — Ambulatory Visit: Payer: Medicare Other

## 2021-11-30 ENCOUNTER — Other Ambulatory Visit: Payer: Self-pay

## 2021-11-30 ENCOUNTER — Encounter: Payer: Self-pay | Admitting: Internal Medicine

## 2021-11-30 VITALS — BP 132/81 | HR 80 | Temp 98.2°F | Ht 68.0 in | Wt 153.0 lb

## 2021-11-30 DIAGNOSIS — K5900 Constipation, unspecified: Secondary | ICD-10-CM

## 2021-11-30 DIAGNOSIS — B20 Human immunodeficiency virus [HIV] disease: Secondary | ICD-10-CM | POA: Diagnosis present

## 2021-11-30 DIAGNOSIS — Z113 Encounter for screening for infections with a predominantly sexual mode of transmission: Secondary | ICD-10-CM

## 2021-11-30 MED ORDER — GENVOYA 150-150-200-10 MG PO TABS: 1.0000 | ORAL_TABLET | Freq: Every day | ORAL | 11 refills | Status: DC

## 2021-11-30 MED ORDER — POLYETHYLENE GLYCOL 3350 17 G PO PACK: 17.0000 g | PACK | Freq: Every day | ORAL | 5 refills | Status: DC | PRN

## 2021-11-30 NOTE — Assessment & Plan Note (Signed)
Will try miralax  Advised to keep hydrated, juice intake.

## 2021-11-30 NOTE — Assessment & Plan Note (Signed)
Will screen.  Low risk, stable partner 

## 2021-11-30 NOTE — Addendum Note (Signed)
Addended by: Harley Alto on: 11/30/2021 02:02 PM   Modules accepted: Orders

## 2021-11-30 NOTE — Progress Notes (Signed)
   Subjective:    Patient ID: [name], male    DOB: [birth year], 41 y.o.   MRN: [id number]  HPI Here for follow up of hiv He continues on Genvoya and no recent missed doses.  When he was seen in February, he had been out of his medications for about 4 days then back on.  No issues since then.  Some issues with constipation and has tried a higher fiber diet, fruit, fruit juices.     Review of Systems  Constitutional:  Negative for fatigue.  Gastrointestinal:  Negative for diarrhea.  Skin:  Negative for rash.       Objective:   Physical Exam Eyes:     General: No scleral icterus. Pulmonary:     Effort: Pulmonary effort is normal.  Neurological:     Mental Status: He is alert.   SH": + tobacco        Assessment & Plan:

## 2021-11-30 NOTE — Assessment & Plan Note (Signed)
He continues to do well on Genvoya and no issues.  No changes indicated and will monitor labs today.  Follow up in 6 months

## 2021-12-01 LAB — T-HELPER CELL (CD4) - (RCID CLINIC ONLY)
CD4 % Helper T Cell: 54 % (ref 33–65)
CD4 T Cell Abs: 935 /uL (ref 400–1790)

## 2021-12-02 ENCOUNTER — Telehealth: Payer: Self-pay

## 2021-12-02 ENCOUNTER — Other Ambulatory Visit: Payer: Self-pay

## 2021-12-02 MED ORDER — DOXYCYCLINE HYCLATE 100 MG PO TABS: 100.0000 mg | ORAL_TABLET | Freq: Two times a day (BID) | ORAL | 0 refills | Status: DC

## 2021-12-02 NOTE — Telephone Encounter (Signed)
Patient aware of RX Doxycyline 100 MG PO 1 tablet BID for 28 days sent to [facility].   Jaz Laningham Lesli Albee, CMA

## 2021-12-02 NOTE — Telephone Encounter (Signed)
-----   Message from Gardiner Barefoot, MD sent at 12/02/2021  8:24 AM EDT ----- Regarding: FW: Positive for syphilis. Needs Bicillin x 1.  Thanks ----- Message ----- From: Leory Plowman, Lab [facility] [place] Sent: 12/01/2021  10:20 AM EDT To: Gardiner Barefoot, MD

## 2021-12-02 NOTE — Telephone Encounter (Signed)
Patient aware. Patient is allergic to penicillin and will need alternative medication.   Mardell Suttles Lesli Albee, CMA

## 2021-12-02 NOTE — Telephone Encounter (Signed)
Please send in doxycycline 100 mg PO bid #[phone] which will cover for 28 days for late latent syphilis

## 2021-12-02 NOTE — Telephone Encounter (Signed)
Received call from [facility]. [name] was confirming that the provider was aware of recent RPR titer results. Communicated that provider was aware and we had reached out to the patient regarding results/treatment. All questions answered.  Wyvonne Lenz, RN

## 2021-12-03 LAB — HIV-1 RNA QUANT-NO REFLEX-BLD
HIV 1 RNA Quant: <20 Copies/mL — ABNORMAL HIGH
HIV-1 RNA Quant, Log: <1.3 Log cps/mL — ABNORMAL HIGH

## 2021-12-03 LAB — FLUORESCENT TREPONEMAL AB(FTA)-IGG-BLD: Fluorescent Treponemal ABS: REACTIVE — AB

## 2021-12-03 LAB — RPR: RPR Ser Ql: REACTIVE — AB

## 2021-12-03 LAB — RPR TITER: RPR Titer: 1:64 {titer} — ABNORMAL HIGH

## 2022-01-03 ENCOUNTER — Telehealth: Payer: Self-pay

## 2022-01-03 NOTE — Telephone Encounter (Signed)
Received call from [place] at [facility], requesting treatment confirmation for patient's RPR titer from 11/30/21. Confirmed prescription sent on 12/02/21 and patient notified. All questions answered.  Binnie Kand, RN

## 2022-01-16 ENCOUNTER — Other Ambulatory Visit: Payer: Self-pay | Admitting: Internal Medicine

## 2022-01-16 DIAGNOSIS — G4700 Insomnia, unspecified: Secondary | ICD-10-CM

## 2022-01-16 DIAGNOSIS — B20 Human immunodeficiency virus [HIV] disease: Secondary | ICD-10-CM

## 2022-01-31 ENCOUNTER — Ambulatory Visit (INDEPENDENT_AMBULATORY_CARE_PROVIDER_SITE_OTHER): Payer: Medicare Other | Admitting: Student

## 2022-01-31 ENCOUNTER — Encounter: Payer: Self-pay | Admitting: Student

## 2022-01-31 ENCOUNTER — Other Ambulatory Visit: Payer: Self-pay

## 2022-01-31 VITALS — BP 124/71 | HR 66 | Temp 98.2°F | Ht 68.0 in | Wt 158.7 lb

## 2022-01-31 DIAGNOSIS — Z202 Contact with and (suspected) exposure to infections with a predominantly sexual mode of transmission: Secondary | ICD-10-CM

## 2022-01-31 DIAGNOSIS — Z23 Encounter for immunization: Secondary | ICD-10-CM | POA: Diagnosis not present

## 2022-01-31 DIAGNOSIS — I10 Essential (primary) hypertension: Secondary | ICD-10-CM

## 2022-01-31 DIAGNOSIS — B20 Human immunodeficiency virus [HIV] disease: Secondary | ICD-10-CM | POA: Diagnosis not present

## 2022-01-31 DIAGNOSIS — E785 Hyperlipidemia, unspecified: Secondary | ICD-10-CM | POA: Diagnosis not present

## 2022-01-31 DIAGNOSIS — F17210 Nicotine dependence, cigarettes, uncomplicated: Secondary | ICD-10-CM | POA: Diagnosis not present

## 2022-01-31 DIAGNOSIS — Z0000 Encounter for general adult medical examination without abnormal findings: Secondary | ICD-10-CM

## 2022-01-31 MED ORDER — AMLODIPINE BESYLATE 10 MG PO TABS: 10.0000 mg | ORAL_TABLET | Freq: Every day | ORAL | 3 refills | Status: DC

## 2022-01-31 NOTE — Progress Notes (Addendum)
CC: Hypertension follow-up  HPI:  [name] is a 41 y.o. male with a past medical history of HIV, syphilis, hypertension who presents to the clinic for follow-up.  Please see assessment and plan for full HPI.  Past Medical History:  Diagnosis Date   Depression    HIV (human immunodeficiency virus infection) ([place])    HIV infection ([place])    Hx of suicide attempt 07/28/2011   Age 68.  Pt stated he wanted to kill himself and he was admitted to a mental health facility for a week or so.    Hypertension    Psychiatric problem      Current Outpatient Medications:    amLODipine (NORVASC) 10 MG tablet, Take 1 tablet (10 mg total) by mouth daily., Disp: 90 tablet, Rfl: 3   elvitegravir-cobicistat-emtricitabine-tenofovir (GENVOYA) 150-150-200-10 MG TABS tablet, Take 1 tablet by mouth daily., Disp: 30 tablet, Rfl: 11   hydrOXYzine (ATARAX) 10 MG tablet, TAKE ONE TABLET BY MOUTH ONCE AS NEEDED FOR UP TO ONE DOSE FOR ITCHING OR ANXIETY, Disp: 30 tablet, Rfl: 0   polyethylene glycol (MIRALAX / GLYCOLAX) 17 g packet, Take 17 g by mouth daily as needed for moderate constipation., Disp: 14 each, Rfl: 5   rosuvastatin (CRESTOR) 10 MG tablet, Take 1 tablet (10 mg total) by mouth daily., Disp: 90 tablet, Rfl: 3   traZODone (DESYREL) 50 MG tablet, Take 1 tablet (50 mg total) by mouth at bedtime as needed. for sleep, Disp: 30 tablet, Rfl: 11  Review of Systems:   Negative except for what is stated in HPI.  Physical Exam:  Vitals:   01/31/22 0929  BP: 124/71  Pulse: 66  Temp: 98.2 F (36.8 C)  TempSrc: Oral  SpO2: 99%  Weight: 158 lb 11.2 oz (72 kg)  Height: 5\' 8"  (1.727 m)    General: Patient is sitting comfortably in the room  Head: Normocephalic, atraumatic  Cardio: Regular rate and rhythm, no murmurs, rubs or gallops. 2+ pulses to bilateral upper and lower extremities  Pulmonary: Clear to ausculation bilaterally with no rales, rhonchi, and crackles  Abdomen: Soft, nontender with  normoactive bowel sounds with no rebound or guarding  Skin: No rashes noted    Assessment & Plan:   Essential hypertension, benign Patient presents with blood pressure of 124/71.  Patient reports he is doing well.  No headaches or visual changes.  He states that he is doing well and has no concerns regarding his blood pressure.  He is compliant with his medication.  He is currently controlled with 10 mg amlodipine daily.  Plan: -Blood pressure is at goal -Continue amlodipine 10 mg daily  Syphilis contact, treated Patient's syphilis was recently treated with doxycycline.  We will check titers today.  Patient denies any symptoms.  Plan: -Recheck titers today -Patient is status post treatment with doxycycline -Syphilis handout provided  HIV disease Patient is doing well.  He reports compliance with Genvoya.  Patient follows with infectious disease.  Patient's most recent CD4 count in the 900s.  HIV level less than 20.  Continue to monitor.  Plan: -Continue with Genvoya -Follow infectious disease  Healthcare maintenance Flu shot and tetanus vaccine administered today  Hyperlipidemia Patient currently managed with rosuvastatin 10 mg daily.  He denies any myalgias.  He denies any concerns.  He does endorse compliance.  Most recent lipid panel showed LDL 119.  We will repeat lipid panel today.  Patient's ASCVD risk 18.9%.  Plan: -Obtain lipid panel -Continue rosuvastatin  10 mg daily   Patient seen with Dr. Wray Kearns, DO PGY-1 Internal Medicine Resident  Pager: [phone]

## 2022-01-31 NOTE — Addendum Note (Signed)
Addended by: Truddie Crumble on: 01/31/2022 12:17 PM   Modules accepted: Orders

## 2022-01-31 NOTE — Progress Notes (Signed)
Internal Medicine Clinic Attending  I saw and evaluated the patient.  I personally confirmed the key portions of the history and exam documented by Dr. Patel and I reviewed pertinent patient test results.  The assessment, diagnosis, and plan were formulated together and I agree with the documentation in the resident's note.  

## 2022-01-31 NOTE — Assessment & Plan Note (Addendum)
Patient presents with blood pressure of 124/71.  Patient reports he is doing well.  No headaches or visual changes.  He states that he is doing well and has no concerns regarding his blood pressure.  He is compliant with his medication.  He is currently controlled with 10 mg amlodipine daily.  Plan: -Blood pressure is at goal -Continue amlodipine 10 mg daily

## 2022-01-31 NOTE — Assessment & Plan Note (Signed)
Patient currently managed with rosuvastatin 10 mg daily.  He denies any myalgias.  He denies any concerns.  He does endorse compliance.  Most recent lipid panel showed LDL 119.  We will repeat lipid panel today.  Patient's ASCVD risk 18.9%.  Plan: -Obtain lipid panel -Continue rosuvastatin 10 mg daily.  Addendum: -Lipid panel showing total cholesterol of 143, down from 181.  LDL 89 down from 119.  Rosuvastatin 10 mg daily seems to be working.  Continue with rosuvastatin 10 mg daily.

## 2022-01-31 NOTE — Assessment & Plan Note (Signed)
Patient's syphilis was recently treated with doxycycline.  We will check titers today.  Patient denies any symptoms.  Plan: -Recheck titers today -Patient is status post treatment with doxycycline -Syphilis handout provided  Addendum: -RPR titers 1: 16.  This may have been pulled too early after discussion with my attending, plan to follow these

## 2022-01-31 NOTE — Patient Instructions (Signed)
[name], [name] you for allowing me to take part in your care today.  Here are your instructions.  1.  Regarding your hypertension, your blood pressure was very good today.  Please continue to monitor your blood pressure at home.  Please continue to take your amlodipine 10 mg daily.  Request refills as necessary.  2.  Regarding your syphilis, we will be checking your titers today, I will call you with the results  3.  Regarding your hyperlipidemia, we will be checking your cholesterol today, I will call you with the results.  Please continue taking Crestor 10 mg daily.  4.  Please follow back up in 6 months for blood pressure check  5.  Regarding your vaccinations, we have given you your Tdap vaccine today and your flu shot today.  You may be sore.  Thank you, Dr. Posey Pronto  If you have any other questions please contact the internal medicine clinic at [phone]

## 2022-01-31 NOTE — Assessment & Plan Note (Signed)
Flu shot and tetanus vaccine administered today

## 2022-01-31 NOTE — Assessment & Plan Note (Signed)
Patient is doing well.  He reports compliance with Genvoya.  Patient follows with infectious disease.  Patient's most recent CD4 count in the 900s.  HIV level less than 20.  Continue to monitor.  Plan: -Continue with Genvoya -Follow infectious disease

## 2022-01-31 NOTE — Addendum Note (Signed)
Addended by: Truddie Crumble on: 01/31/2022 10:32 AM   Modules accepted: Orders

## 2022-02-01 NOTE — Progress Notes (Signed)
RPR reactive as expected.  RPR titers 1:16, previous titer 1:64 two months ago..  These are preliminary results.  Lipid panel showing decreasing cholesterol to 143 from 181 and decreasing LDL from 119 to 89 today.  Rosuvastatin 10 mg daily seems to be working.  I communicated this to patient via phone call.  I will call patient back with final results of RPR.  Of note, RPR may have been pulled a little too early after discussion with Dr. Philipp Ovens. Will follow titers.

## 2022-02-02 LAB — LIPID PANEL
Chol/HDL Ratio: 3.4 ratio (ref 0.0–5.0)
Cholesterol, Total: 143 mg/dL (ref 100–199)
HDL: 42 mg/dL (ref >39)
LDL Chol Calc (NIH): 89 mg/dL (ref 0–99)
Triglycerides: 60 mg/dL (ref 0–149)
VLDL Cholesterol Cal: 12 mg/dL (ref 5–40)

## 2022-02-02 LAB — RPR, QUANT+TP ABS (REFLEX)
Rapid Plasma Reagin, Quant: 1:16 {titer} — ABNORMAL HIGH
T Pallidum Abs: REACTIVE — AB

## 2022-02-02 LAB — RPR: RPR Ser Ql: REACTIVE — AB

## 2022-02-28 ENCOUNTER — Other Ambulatory Visit: Payer: Self-pay | Admitting: Internal Medicine

## 2022-02-28 DIAGNOSIS — L299 Pruritus, unspecified: Secondary | ICD-10-CM

## 2022-03-31 ENCOUNTER — Other Ambulatory Visit: Payer: Self-pay | Admitting: Internal Medicine

## 2022-03-31 DIAGNOSIS — L299 Pruritus, unspecified: Secondary | ICD-10-CM

## 2022-05-23 ENCOUNTER — Other Ambulatory Visit: Payer: Self-pay

## 2022-05-23 ENCOUNTER — Other Ambulatory Visit: Payer: Medicare Other

## 2022-05-23 DIAGNOSIS — Z113 Encounter for screening for infections with a predominantly sexual mode of transmission: Secondary | ICD-10-CM

## 2022-05-23 DIAGNOSIS — B20 Human immunodeficiency virus [HIV] disease: Secondary | ICD-10-CM

## 2022-05-24 LAB — T-HELPER CELL (CD4) - (RCID CLINIC ONLY)
CD4 % Helper T Cell: 50 % (ref 33–65)
CD4 T Cell Abs: 949 /uL (ref 400–1790)

## 2022-05-25 LAB — CBC WITH DIFFERENTIAL/PLATELET
Absolute Monocytes: 515 {cells}/uL (ref 200–950)
Basophils Absolute: 78 {cells}/uL (ref 0–200)
Basophils Relative: 1.5 %
Eosinophils Absolute: 130 {cells}/uL (ref 15–500)
Eosinophils Relative: 2.5 %
HCT: 39.9 % (ref 38.5–50.0)
Hemoglobin: 13.3 g/dL (ref 13.2–17.1)
Lymphs Abs: 2231 {cells}/uL (ref 850–3900)
MCH: 31.3 pg (ref 27.0–33.0)
MCHC: 33.3 g/dL (ref 32.0–36.0)
MCV: 93.9 fL (ref 80.0–100.0)
MPV: 9.5 fL (ref 7.5–12.5)
Monocytes Relative: 9.9 %
Neutro Abs: 2246 {cells}/uL (ref 1500–7800)
Neutrophils Relative %: 43.2 %
Platelets: 426 Thousand/uL — ABNORMAL HIGH (ref 140–400)
RBC: 4.25 Million/uL (ref 4.20–5.80)
RDW: 11 % (ref 11.0–15.0)
Total Lymphocyte: 42.9 %
WBC: 5.2 Thousand/uL (ref 3.8–10.8)

## 2022-05-25 LAB — COMPLETE METABOLIC PANEL WITH GFR
AG Ratio: 1.6 (calc) (ref 1.0–2.5)
ALT: 10 U/L (ref 9–46)
AST: 11 U/L (ref 10–40)
Albumin: 4.5 g/dL (ref 3.6–5.1)
Alkaline phosphatase (APISO): 56 U/L (ref 36–130)
BUN/Creatinine Ratio: 18 (calc) (ref 6–22)
BUN: 26 mg/dL — ABNORMAL HIGH (ref 7–25)
CO2: 25 mmol/L (ref 20–32)
Calcium: 9.7 mg/dL (ref 8.6–10.3)
Chloride: 103 mmol/L (ref 98–110)
Creat: 1.41 mg/dL — ABNORMAL HIGH (ref 0.60–1.29)
Globulin: 2.8 g/dL (calc) (ref 1.9–3.7)
Glucose, Bld: 92 mg/dL (ref 65–99)
Potassium: 4.4 mmol/L (ref 3.5–5.3)
Sodium: 138 mmol/L (ref 135–146)
Total Bilirubin: 0.3 mg/dL (ref 0.2–1.2)
Total Protein: 7.3 g/dL (ref 6.1–8.1)
eGFR: 63 mL/min/{1.73_m2} (ref >=60)

## 2022-05-25 LAB — HIV-1 RNA QUANT-NO REFLEX-BLD
HIV 1 RNA Quant: <20 Copies/mL — ABNORMAL HIGH
HIV-1 RNA Quant, Log: <1.3 Log cps/mL — ABNORMAL HIGH

## 2022-05-25 LAB — RPR: RPR Ser Ql: REACTIVE — AB

## 2022-05-25 LAB — RPR TITER: RPR Titer: 1:4 {titer} — ABNORMAL HIGH

## 2022-05-25 LAB — T PALLIDUM AB: T Pallidum Abs: POSITIVE — AB

## 2022-05-26 ENCOUNTER — Encounter: Payer: Self-pay | Admitting: *Deleted

## 2022-05-26 ENCOUNTER — Ambulatory Visit (INDEPENDENT_AMBULATORY_CARE_PROVIDER_SITE_OTHER): Payer: Medicare Other | Admitting: Student

## 2022-05-26 ENCOUNTER — Encounter: Payer: Self-pay | Admitting: Student

## 2022-05-26 ENCOUNTER — Ambulatory Visit (INDEPENDENT_AMBULATORY_CARE_PROVIDER_SITE_OTHER): Payer: Medicare Other | Admitting: *Deleted

## 2022-05-26 VITALS — BP 122/71 | HR 76 | Temp 98.5°F | Wt 158.7 lb

## 2022-05-26 VITALS — BP 122/71 | HR 76 | Temp 98.5°F | Ht 68.0 in | Wt 158.8 lb

## 2022-05-26 DIAGNOSIS — F17210 Nicotine dependence, cigarettes, uncomplicated: Secondary | ICD-10-CM | POA: Diagnosis not present

## 2022-05-26 DIAGNOSIS — Z0000 Encounter for general adult medical examination without abnormal findings: Secondary | ICD-10-CM | POA: Diagnosis not present

## 2022-05-26 DIAGNOSIS — I10 Essential (primary) hypertension: Secondary | ICD-10-CM | POA: Diagnosis not present

## 2022-05-26 DIAGNOSIS — B20 Human immunodeficiency virus [HIV] disease: Secondary | ICD-10-CM

## 2022-05-26 DIAGNOSIS — Z720 Tobacco use: Secondary | ICD-10-CM

## 2022-05-26 NOTE — Patient Instructions (Signed)
Thank you so much for coming to the clinic today!   I'm glad you're doing well! Today, we discussed smoking cessation and the best way to go about it. I think to start, if you start making your pack last 3-4 days instead of two, and slowly making it last longer you'd be on a great path to eventually stop using.    If you have any questions please feel free to the call the clinic at anytime at [phone]. It was a pleasure seeing you!  Best, Dr. Sanjuana Mae

## 2022-05-26 NOTE — Progress Notes (Signed)
Subjective:   [name] is a 42 y.o. male who presents for an Initial Medicare Annual Wellness Visit.  Review of Systems    Defer to pcp       Objective:    Today's Vitals   05/26/22 0845 05/26/22 1038  BP: 122/71   Pulse: 76   Temp: 98.5 F (36.9 C)   TempSrc: Oral   SpO2: 100%   Weight: 158 lb 11.7 oz (72 kg)   PainSc:  0-No pain   Body mass index is 24.14 kg/m.     05/26/2022   10:34 AM 05/26/2022    8:47 AM 01/31/2022    9:25 AM 08/16/2021    8:41 AM 06/09/2021    9:01 AM 02/15/2021    2:09 PM 02/08/2018   10:13 PM  Advanced Directives  Does Patient Have a Medical Advance Directive? No No No No No No   Would patient like information on creating a medical advance directive?  No - Patient declined No - Patient declined No - Patient declined No - Patient declined No - Patient declined      Information is confidential and restricted. Go to Review Flowsheets to unlock data.    Current Medications (verified) Outpatient Encounter Medications as of 05/26/2022  Medication Sig   amLODipine (NORVASC) 10 MG tablet Take 1 tablet (10 mg total) by mouth daily.   elvitegravir-cobicistat-emtricitabine-tenofovir (GENVOYA) 150-150-200-10 MG TABS tablet Take 1 tablet by mouth daily.   hydrOXYzine (ATARAX) 10 MG tablet TAKE ONE TABLET BY MOUTH AS NEEDED FOR ITCHING OR ANXIETY   polyethylene glycol (MIRALAX / GLYCOLAX) 17 g packet Take 17 g by mouth daily as needed for moderate constipation.   rosuvastatin (CRESTOR) 10 MG tablet Take 1 tablet (10 mg total) by mouth daily.   traZODone (DESYREL) 50 MG tablet Take 1 tablet (50 mg total) by mouth at bedtime as needed. for sleep   No facility-administered encounter medications on file as of 05/26/2022.    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  Pt stated he wanted to kill himself and he was admitted to a mental health facility for a week or so.    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   Past Surgical History:  Procedure Laterality Date   ORIF CLAVICULAR FRACTURE Right 08/21/2012   Procedure: OPEN REDUCTION INTERNAL FIXATION (ORIF) CLAVICULAR FRACTURE;  Surgeon: Marybelle Killings, MD;  Location: [facility];  Service: Orthopedics;  Laterality: Right;   ORIF TIBIA PLATEAU Right 08/21/2012   Procedure: OPEN REDUCTION INTERNAL FIXATION (ORIF) RIGHT LATERAL TIBIAL PLATEAU;  Surgeon: Marybelle Killings, MD;  Location: [facility];  Service: Orthopedics;  Laterality: Right;   Family History  Problem Relation Age of Onset   Hypertension Mother    Hypertension Father    Heart attack Father 81   Social History   Socioeconomic History   Marital status: Married    Spouse name: Not on file   Number of children: Not on file   Years of education: Not on file   Highest education level: Not on file  Occupational History   Not  on file  Tobacco Use   Smoking status: Every Day    Packs/day: 0.50    Years: 20.00    Total pack years: 10.00    Types: Cigarettes   Smokeless tobacco: Never   Tobacco comments:    encouraged to quit to help bp as well  Vaping Use   Vaping Use: Never used  Substance and Sexual Activity   Alcohol use: Yes    Alcohol/week: 8.0 standard drinks of alcohol    Types: 8 Cans of beer per week    Comment: 3 - 6 packs (12oz cans) per week   Drug use: Yes    Frequency: 3.0 times per week    Types: Marijuana, Cocaine   Sexual activity: Yes    Partners: Male    Birth control/protection: None    Comment: declined condoms  Other Topics Concern   Not on file  Social History Narrative   ** Merged History Encounter **       Social  Determinants of Health   Financial Resource Strain: Not on file  Food Insecurity: No Food Insecurity (05/26/2022)   Hunger Vital Sign    Worried About Running Out of Food in the Last Year: Never true    Ran Out of Food in the Last Year: Never true  Transportation Needs: Not on file  Physical Activity: Inactive (05/26/2022)   Exercise Vital Sign    Days of Exercise per Week: 0 days    Minutes of Exercise per Session: 0 min  Stress: Not on file  Social Connections: Socially Isolated (05/26/2022)   Social Connection and Isolation Panel [NHANES]    Frequency of Communication with Friends and Family: More than three times a week    Frequency of Social Gatherings with Friends and Family: Three times a week    Attends Religious Services: Never    Active Member of Clubs or Organizations: No    Attends Archivist Meetings: Never    Marital Status: Never married    Tobacco Counseling Ready to quit: No Counseling given: No Tobacco comments: encouraged to quit to help bp as well   Clinical Intake:  Pre-visit preparation completed: Yes  Pain : No/denies pain Pain Score: 0-No pain     BMI - recorded: 24.15 Nutritional Status: BMI of 19-24  Normal Diabetes: No  How often do you need to have someone help you when you read instructions, pamphlets, or other written materials from your doctor or pharmacy?: 1 - Never What is the last grade level you completed in school?: 12th  Diabetic?no   Interpreter Needed?: No  Information entered by :: [email or web address]   Activities of Daily Living    05/26/2022   10:34 AM 05/26/2022    8:46 AM  In your present state of health, do you have any difficulty performing the following activities:  Hearing? 0 0  Vision? 0 0  Difficulty concentrating or making decisions? 0 0  Walking or climbing stairs? 1 1  Dressing or bathing? 0 0  Doing errands, shopping? 0 0    Patient Care Team: Leigh Aurora, DO as PCP - General Comer, Okey Regal, MD  as PCP - Infectious Diseases (Infectious Diseases) Comer, Okey Regal, MD (Infectious Diseases)  Indicate any recent Medical Services you may have received from other than [facility] providers in the past year (date may be approximate).     Assessment:   This is a routine wellness examination for [name].  Hearing/Vision screen No results found.  Dietary issues  and exercise activities discussed:     Goals Addressed   None   Depression Screen    05/26/2022   12:49 PM 05/26/2022   11:12 AM 01/31/2022    9:25 AM 11/30/2021    1:42 PM 08/16/2021    8:41 AM 06/09/2021    9:00 AM 05/19/2021    2:46 PM  PHQ 2/9 Scores  PHQ - 2 Score 0 0 0 0 0 0 0  PHQ- 9 Score   0   0     Fall Risk    05/26/2022   10:33 AM 05/26/2022    8:46 AM 01/31/2022    9:25 AM 11/30/2021    1:42 PM 08/16/2021    8:40 AM  Fall Risk   Falls in the past year? 1 1 0 0 0  Number falls in past yr: 1 1   0  Injury with Fall? 1 0   0  Risk for fall due to : History of fall(s)  No Fall Risks No Fall Risks No Fall Risks  Follow up Falls evaluation completed Falls evaluation completed Falls evaluation completed Falls evaluation completed Falls evaluation completed;Falls prevention discussed    FALL RISK PREVENTION PERTAINING TO THE HOME:  Any stairs in or around the home? No  If so, are there any without handrails? No  Home free of loose throw rugs in walkways, pet beds, electrical cords, etc? Yes  Adequate lighting in your home to reduce risk of falls? Yes   ASSISTIVE DEVICES UTILIZED TO PREVENT FALLS:  Life alert? No  Use of a cane, walker or w/c? Yes  Grab bars in the bathroom? No  Shower chair or bench in shower? No  Elevated toilet seat or a handicapped toilet? No   TIMED UP AND GO:  Was the test performed? No .  Length of time to ambulate 10 feet: 0 sec.   Gait slow and steady without use of assistive device  Cognitive Function:        05/26/2022   12:49 PM  6CIT Screen  What Year? 0 points  What month?  0 points  What time? 0 points  Count back from 20 0 points  Months in reverse 0 points  Repeat phrase 2 points  Total Score 2 points    Immunizations Immunization History  Administered Date(s) Administered   Hepatitis A 08/25/2011, 07/29/2012   Hepatitis B 08/25/2011, 07/29/2012   Hepatitis B, ADULT 03/04/2018, 04/04/2018   Influenza Split 01/11/2012   Influenza,inj,Quad PF,6+ Mos 02/13/2013, 04/28/2014, 01/25/2016, 02/28/2017, 03/04/2018, 02/24/2019, 03/24/2020, 01/31/2022   Meningococcal Mcv4o 08/28/2016, 08/28/2017   PFIZER(Purple Top)SARS-COV-2 Vaccination 12/08/2019, 12/29/2019   PPD Test 07/27/2011   Pneumococcal Conjugate-13 03/04/2018   Pneumococcal Polysaccharide-23 07/27/2011, 08/28/2017   Tdap 01/31/2022    TDAP status: Up to date  Flu Vaccine status: Up to date  Pneumococcal vaccine status: Up to date  Covid-19 vaccine status: Completed vaccines  Qualifies for Shingles Vaccine? No   Zostavax completed No   Shingrix Completed?: No.    Education has been provided regarding the importance of this vaccine. Patient has been advised to call insurance company to determine out of pocket expense if they have not yet received this vaccine. Advised may also receive vaccine at local pharmacy or Health Dept. Verbalized acceptance and understanding.  Screening Tests Health Maintenance  Topic Date Due   COVID-19 Vaccine (3 - Pfizer risk series) 01/26/2020   Medicare Annual Wellness (AWV)  05/27/2023   DTaP/Tdap/Td (2 - Td or Tdap) 02/01/2032  INFLUENZA VACCINE  Completed   Hepatitis C Screening  Completed   HIV Screening  Completed   HPV VACCINES  Aged Out    Health Maintenance  Health Maintenance Due  Topic Date Due   COVID-19 Vaccine (3 - Pfizer risk series) 01/26/2020    Colorectal cancer screening: not due at this time due to age   Lung Cancer Screening: (Low Dose CT Chest recommended if Age 15-80 years, 30 pack-year currently smoking OR have quit w/in  15years.) does not qualify.   Lung Cancer Screening Referral:n/a  Additional Screening:  Hepatitis C Screening: does qualify; Completed 07/27/2011  Vision Screening: Recommended annual ophthalmology exams for early detection of glaucoma and other disorders of the eye. Is the patient up to date with their annual eye exam?  No  Who is the provider or what is the name of the office in which the patient attends annual eye exams? unknown If pt is not established with a provider, would they like to be referred to a provider to establish care?  Pt wishes to hold off at this time .   Dental Screening: Recommended annual dental exams for proper oral hygiene  Community Resource Referral / Chronic Care Management: CRR required this visit?  No   CCM required this visit?  No      Plan:     I have personally reviewed and noted the following in the patient's chart:   Medical and social history Use of alcohol, tobacco or illicit drugs  Current medications and supplements including opioid prescriptions. Patient is not currently taking opioid prescriptions. Functional ability and status Nutritional status Physical activity Advanced directives List of other physicians Hospitalizations, surgeries, and ER visits in previous 12 months Vitals Screenings to include cognitive, depression, and falls Referrals and appointments  In addition, I have reviewed and discussed with patient certain preventive protocols, quality metrics, and best practice recommendations. A written personalized care plan for preventive services as well as general preventive health recommendations were provided to patient.     Nicoletta Dress, [place]   05/26/2022   Nurse Notes: face to face  [name] , Thank you for taking time to come for your Medicare Wellness Visit. I appreciate your ongoing commitment to your health goals. Please review the following plan we discussed and let me know if I can assist you in the  future.   These are the goals we discussed:  Goals   None     This is a list of the screening recommended for you and due dates:  Health Maintenance  Topic Date Due   COVID-19 Vaccine (3 - Pfizer risk series) 01/26/2020   Medicare Annual Wellness Visit  05/27/2023   DTaP/Tdap/Td vaccine (2 - Td or Tdap) 02/01/2032   Flu Shot  Completed   Hepatitis C Screening: USPSTF Recommendation to screen - Ages 18-79 yo.  Completed   HIV Screening  Completed   HPV Vaccine  Aged Out

## 2022-05-26 NOTE — Patient Instructions (Signed)
Health Maintenance, Male Adopting a healthy lifestyle and getting preventive care are important in promoting health and wellness. Ask your health care provider about: The right schedule for you to have regular tests and exams. Things you can do on your own to prevent diseases and keep yourself healthy. What should I know about diet, weight, and exercise? Eat a healthy diet  Eat a diet that includes plenty of vegetables, fruits, low-fat dairy products, and lean protein. Do not eat a lot of foods that are high in solid fats, added sugars, or sodium. Maintain a healthy weight Body mass index (BMI) is a measurement that can be used to identify possible weight problems. It estimates body fat based on height and weight. Your health care provider can help determine your BMI and help you achieve or maintain a healthy weight. Get regular exercise Get regular exercise. This is one of the most important things you can do for your health. Most adults should: Exercise for at least 150 minutes each week. The exercise should increase your heart rate and make you sweat (moderate-intensity exercise). Do strengthening exercises at least twice a week. This is in addition to the moderate-intensity exercise. Spend less time sitting. Even light physical activity can be beneficial. Watch cholesterol and blood lipids Have your blood tested for lipids and cholesterol at 43 years of age, then have this test every 5 years. You may need to have your cholesterol levels checked more often if: Your lipid or cholesterol levels are high. You are older than 43 years of age. You are at high risk for heart disease. What should I know about cancer screening? Many types of cancers can be detected early and may often be prevented. Depending on your health history and family history, you may need to have cancer screening at various ages. This may include screening for: Colorectal cancer. Prostate cancer. Skin cancer. Lung  cancer. What should I know about heart disease, diabetes, and high blood pressure? Blood pressure and heart disease High blood pressure causes heart disease and increases the risk of stroke. This is more likely to develop in people who have high blood pressure readings or are overweight. Talk with your health care provider about your target blood pressure readings. Have your blood pressure checked: Every 3-5 years if you are 18-39 years of age. Every year if you are 40 years old or older. If you are between the ages of 65 and 75 and are a current or former smoker, ask your health care provider if you should have a one-time screening for abdominal aortic aneurysm (AAA). Diabetes Have regular diabetes screenings. This checks your fasting blood sugar level. Have the screening done: Once every three years after age 45 if you are at a normal weight and have a low risk for diabetes. More often and at a younger age if you are overweight or have a high risk for diabetes. What should I know about preventing infection? Hepatitis B If you have a higher risk for hepatitis B, you should be screened for this virus. Talk with your health care provider to find out if you are at risk for hepatitis B infection. Hepatitis C Blood testing is recommended for: Everyone born from 1945 through 1965. Anyone with known risk factors for hepatitis C. Sexually transmitted infections (STIs) You should be screened each year for STIs, including gonorrhea and chlamydia, if: You are sexually active and are younger than 43 years of age. You are older than 43 years of age and your   health care provider tells you that you are at risk for this type of infection. Your sexual activity has changed since you were last screened, and you are at increased risk for chlamydia or gonorrhea. Ask your health care provider if you are at risk. Ask your health care provider about whether you are at high risk for HIV. Your health care provider  may recommend a prescription medicine to help prevent HIV infection. If you choose to take medicine to prevent HIV, you should first get tested for HIV. You should then be tested every 3 months for as long as you are taking the medicine. Follow these instructions at home: Alcohol use Do not drink alcohol if your health care provider tells you not to drink. If you drink alcohol: Limit how much you have to 0-2 drinks a day. Know how much alcohol is in your drink. In the [place] one drink equals one 12 oz bottle of beer (355 mL), one 5 oz glass of wine (148 mL), or one 1 oz glass of hard liquor (44 mL). Lifestyle Do not use any products that contain nicotine or tobacco. These products include cigarettes, chewing tobacco, and vaping devices, such as e-cigarettes. If you need help quitting, ask your health care provider. Do not use street drugs. Do not share needles. Ask your health care provider for help if you need support or information about quitting drugs. General instructions Schedule regular health, dental, and eye exams. Stay current with your vaccines. Tell your health care provider if: You often feel depressed. You have ever been abused or do not feel safe at home. Summary Adopting a healthy lifestyle and getting preventive care are important in promoting health and wellness. Follow your health care provider's instructions about healthy diet, exercising, and getting tested or screened for diseases. Follow your health care provider's instructions on monitoring your cholesterol and blood pressure. This information is not intended to replace advice given to you by your health care provider. Make sure you discuss any questions you have with your health care provider. Document Revised: 08/16/2020 Document Reviewed: 08/16/2020 Elsevier Patient Education  2023 Elsevier Inc.  

## 2022-05-26 NOTE — Assessment & Plan Note (Signed)
HIV is well-controlled on Genvoya.  Currently follows up with Dr. Linus Salmons of infectious disease.  He denies any side effects of Genvoya, and is tolerating it well.

## 2022-05-26 NOTE — Assessment & Plan Note (Signed)
Patient's blood pressure is 122/71.  He denies any chest pain, nausea, vomiting, dizziness, blurry vision.  His current medical regimen is amlodipine 10 mg.  Will continue

## 2022-05-26 NOTE — Assessment & Plan Note (Signed)
Discussed extensively about the negative effects of tobacco use.  He is worried about developing cancer, as his father passed away 2 years ago from throat cancer in his best friend recently passed away from colon cancer.  Patient's biggest risk factor right now for developing malignancy is cigarette use.  He currently smokes a pack every 2 days.  Currently at this time he is interested in weaning down, but has denied any medications for it.  Advised patient to start by making that pack last a few more days as expanding the longevity of 1 pack.  Informed patient that if he does like medication instead there are options available to him, and to follow-up with [place].

## 2022-05-26 NOTE — Progress Notes (Signed)
CC: Checkup  HPI:  [name] is a 42 y.o. male living with a history stated below and presents today for checkup. Please see problem based assessment and plan for additional details.  Past Medical History:  Diagnosis Date   C2 cervical fracture ([place]) 08/18/2012   Depression    Heparin induced thrombocytopenia ([place]) 08/27/2012   HIV (human immunodeficiency virus infection) ([place])    HIV infection ([place])    Hx of suicide attempt 07/28/2011   Age 78.  Pt stated he wanted to kill himself and he was admitted to a mental health facility for a week or so.    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 90 tablet 3   elvitegravir-cobicistat-emtricitabine-tenofovir (GENVOYA) 150-150-200-10 MG TABS tablet Take 1 tablet by mouth daily. 30 tablet 11   hydrOXYzine (ATARAX) 10 MG tablet TAKE ONE TABLET BY MOUTH AS NEEDED FOR ITCHING OR ANXIETY 30 tablet 0   polyethylene glycol (MIRALAX / GLYCOLAX) 17 g packet Take 17 g by mouth daily as needed for moderate constipation. 14 each 5   rosuvastatin (CRESTOR) 10 MG tablet Take 1 tablet (10 mg total) by mouth daily. 90 tablet 3   traZODone (DESYREL) 50 MG tablet Take 1 tablet (50 mg total) by mouth at bedtime as needed. for sleep 30 tablet 11   No current facility-administered medications on file prior to visit.    Family History  Problem Relation Age of Onset   Hypertension Mother    Hypertension Father    Heart attack Father 68    Social History   Socioeconomic History   Marital status: Married    Spouse name: Not on file   Number of children: Not on file   Years of education: Not on file   Highest education level: Not on file  Occupational History   Not on file  Tobacco  Use   Smoking status: Every Day    Packs/day: 0.50    Years: 20.00    Total pack years: 10.00    Types: Cigarettes   Smokeless tobacco: Never   Tobacco comments:    encouraged to quit to help bp as well  Vaping Use   Vaping Use: Never used  Substance and Sexual Activity   Alcohol use: Yes    Alcohol/week: 8.0 standard drinks of alcohol    Types: 8 Cans of beer per week    Comment: 3 - 6 packs (12oz cans) per week   Drug use: Yes    Frequency: 3.0 times per week    Types: Marijuana, Cocaine   Sexual activity: Yes    Partners: Male    Birth control/protection: None    Comment: declined condoms  Other Topics Concern   Not on file  Social History Narrative   ** Merged History Encounter **       Social Determinants of Health   Financial Resource Strain: Not on file  Food Insecurity: Not on file  Transportation Needs: Not on file  Physical Activity: Not on file  Stress: Not on file  Social Connections: Not on file  Intimate Partner Violence: Not on file    Review of Systems: ROS negative except for what is noted on the assessment and plan.  Vitals:   05/26/22 0845  BP: 122/71  Pulse: 76  Temp: 98.5 F (36.9 C)  TempSrc: Oral  SpO2: 100%  Weight: 158 lb 12.8 oz (72 kg)  Height: 5' 8"$  (1.727 m)    Physical Exam: Constitutional: well-appearing male sitting in chair, in no acute distress HENT: normocephalic atraumatic, mucous membranes moist Eyes: conjunctiva non-erythematous Neck: supple Cardiovascular: regular rate and rhythm, no m/r/g Pulmonary/Chest: normal work of breathing on room air, lungs clear to auscultation bilaterally Abdominal: soft, non-tender, non-distended MSK: normal bulk and tone Neurological: alert & oriented x 3, 5/5 strength in bilateral upper and lower extremities, normal gait Skin: warm and dry   Assessment & Plan:   HIV disease HIV is well-controlled on Genvoya.  Currently follows up with Dr. Linus Salmons of infectious disease.  He denies  any side effects of Genvoya, and is tolerating it well.  Tobacco abuse Discussed extensively about the negative effects of tobacco use.  He is worried about developing cancer, as his father passed away 2 years ago from throat cancer in his best friend recently passed away from colon cancer.  Patient's biggest risk factor right now for developing malignancy is cigarette use.  He currently smokes a pack every 2 days.  Currently at this time he is interested in weaning down, but has denied any medications for it.  Advised patient to start by making that pack last a few more days as expanding the longevity of 1 pack.  Informed patient that if he does like medication instead there are options available to him, and to follow-up with [place].  Essential hypertension, benign Patient's blood pressure is 122/71.  He denies any chest pain, nausea, vomiting, dizziness, blurry vision.  His current medical regimen is amlodipine 10 mg.  Will continue  Patient discussed with Dr. Thomasene Ripple, M.D. [facility], PGY-1 Phone: [phone] Date 05/26/2022 Time 10:23 AM

## 2022-05-29 NOTE — Progress Notes (Signed)
Internal Medicine Clinic Attending  Case discussed with the resident at the time of the visit.  We reviewed the resident's history and exam and pertinent patient test results.  I agree with the assessment, diagnosis, and plan of care documented in the resident's note.  

## 2022-06-01 ENCOUNTER — Other Ambulatory Visit: Payer: Self-pay | Admitting: Internal Medicine

## 2022-06-02 NOTE — Progress Notes (Signed)
Internal Medicine Clinic Attending  Case and documentation of Dr. Sanjuana Mae  reviewed.  I reviewed the AWV findings.  I agree with the assessment, diagnosis, and plan of care documented in the AWV note.

## 2022-06-07 ENCOUNTER — Ambulatory Visit: Payer: Medicare Other | Admitting: Internal Medicine

## 2022-06-09 ENCOUNTER — Ambulatory Visit: Payer: Medicare Other | Admitting: Internal Medicine

## 2022-08-16 ENCOUNTER — Ambulatory Visit: Payer: Medicare Other | Admitting: Internal Medicine

## 2022-08-22 ENCOUNTER — Other Ambulatory Visit: Payer: Self-pay | Admitting: Internal Medicine

## 2022-08-22 DIAGNOSIS — B20 Human immunodeficiency virus [HIV] disease: Secondary | ICD-10-CM

## 2022-08-22 DIAGNOSIS — G4700 Insomnia, unspecified: Secondary | ICD-10-CM

## 2022-09-06 ENCOUNTER — Ambulatory Visit: Payer: Medicare Other | Admitting: Internal Medicine

## 2022-09-12 ENCOUNTER — Encounter: Payer: Self-pay | Admitting: Internal Medicine

## 2022-09-12 ENCOUNTER — Ambulatory Visit (INDEPENDENT_AMBULATORY_CARE_PROVIDER_SITE_OTHER): Payer: Medicare Other | Admitting: Internal Medicine

## 2022-09-12 ENCOUNTER — Other Ambulatory Visit: Payer: Self-pay

## 2022-09-12 ENCOUNTER — Other Ambulatory Visit (HOSPITAL_COMMUNITY)
Admission: RE | Admit: 2022-09-12 | Discharge: 2022-09-12 | Disposition: A | Discharge status: Home / Self Care | Payer: Medicare Other | Source: Ambulatory Visit | Attending: Internal Medicine | Admitting: Internal Medicine

## 2022-09-12 VITALS — BP 137/80 | HR 78 | Temp 97.7°F | Ht 68.0 in | Wt 157.0 lb

## 2022-09-12 DIAGNOSIS — B20 Human immunodeficiency virus [HIV] disease: Secondary | ICD-10-CM

## 2022-09-12 MED ORDER — GENVOYA 150-150-200-10 MG PO TABS: 1.0000 | ORAL_TABLET | Freq: Every day | ORAL | 11 refills | Status: DC

## 2022-09-12 NOTE — Assessment & Plan Note (Addendum)
Will check his viral load today to assure continued suppression and otherwise follow up in 6 months Anal pap today  I have personally spent 20 minutes involved in face-to-face and non-face-to-face activities for this patient on the day of the visit. Professional time spent includes the following activities: Preparing to see the patient (review of tests), Obtaining and/or reviewing separately obtained history (admission/discharge record), Performing a medically appropriate examination and/or evaluation , Ordering medications/tests/procedures, referring and communicating with other health care professionals, Documenting clinical information in the EMR, Independently interpreting results (not separately reported), Communicating results to the patient/family/caregiver, Counseling and educating the patient/family/caregiver and Care coordination (not separately reported).

## 2022-09-12 NOTE — Progress Notes (Signed)
   Subjective:    Patient ID: [name], male    DOB: [birth year], 42 y.o.   MRN: [id number]  HPI [name] is here for follow up of HIV He continues on Genvoya with no missed doses.  No concerns today.  No issues with getting or taking his medication.     Review of Systems  Constitutional:  Negative for fatigue.  Gastrointestinal:  Negative for diarrhea and nausea.  Skin:  Negative for rash.       Objective:   Physical Exam Eyes:     General: No scleral icterus. Pulmonary:     Effort: Pulmonary effort is normal.  Neurological:     Mental Status: He is alert.   SH: + tobacco        Assessment & Plan:

## 2022-09-15 LAB — HIV-1 RNA QUANT-NO REFLEX-BLD
HIV 1 RNA Quant: <20 Copies/mL — ABNORMAL HIGH
HIV-1 RNA Quant, Log: <1.3 Log cps/mL — ABNORMAL HIGH

## 2022-09-20 LAB — CYTOLOGY - PAP: Diagnosis: NEGATIVE

## 2022-09-21 ENCOUNTER — Telehealth: Payer: Self-pay

## 2022-09-21 NOTE — Telephone Encounter (Signed)
Spoke with [name], relayed that viral load undetectable and anal pap showed no abnormalities. Patient verbalized understanding and has no further questions.   Sandie Ano, RN

## 2022-09-21 NOTE — Telephone Encounter (Signed)
-----   Message from Veryl Speak, FNP sent at 09/21/2022  9:12 AM EDT ----- Please inform Mr. Streater that his lab work looked okay with no evidence of lesion or malignancy.

## 2023-01-04 ENCOUNTER — Other Ambulatory Visit: Payer: Self-pay

## 2023-01-04 DIAGNOSIS — E785 Hyperlipidemia, unspecified: Secondary | ICD-10-CM

## 2023-01-04 MED ORDER — ROSUVASTATIN CALCIUM 10 MG PO TABS: 10.0000 mg | ORAL_TABLET | Freq: Every day | ORAL | 3 refills | Status: DC

## 2023-01-04 NOTE — Telephone Encounter (Signed)
Patient called he is requesting a rx refill, he stated he is out of the medication. Patient is scheduled to return 10/15.

## 2023-01-11 DIAGNOSIS — I10 Essential (primary) hypertension: Secondary | ICD-10-CM | POA: Diagnosis not present

## 2023-01-11 DIAGNOSIS — Z809 Family history of malignant neoplasm, unspecified: Secondary | ICD-10-CM | POA: Diagnosis not present

## 2023-01-11 DIAGNOSIS — B20 Human immunodeficiency virus [HIV] disease: Secondary | ICD-10-CM | POA: Diagnosis not present

## 2023-01-11 DIAGNOSIS — G4700 Insomnia, unspecified: Secondary | ICD-10-CM | POA: Diagnosis not present

## 2023-01-11 DIAGNOSIS — L299 Pruritus, unspecified: Secondary | ICD-10-CM | POA: Diagnosis not present

## 2023-01-11 DIAGNOSIS — Z79899 Other long term (current) drug therapy: Secondary | ICD-10-CM | POA: Diagnosis not present

## 2023-01-11 DIAGNOSIS — Z720 Tobacco use: Secondary | ICD-10-CM | POA: Diagnosis not present

## 2023-01-11 DIAGNOSIS — E785 Hyperlipidemia, unspecified: Secondary | ICD-10-CM | POA: Diagnosis not present

## 2023-01-23 ENCOUNTER — Encounter: Payer: Medicare Other | Admitting: Student
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
## 2023-02-08 ENCOUNTER — Encounter: Payer: Self-pay | Admitting: Student

## 2023-02-08 ENCOUNTER — Ambulatory Visit: Payer: Medicare Other | Admitting: Student

## 2023-02-08 VITALS — BP 133/79 | HR 80 | Temp 98.3°F | Wt 154.3 lb

## 2023-02-08 DIAGNOSIS — E785 Hyperlipidemia, unspecified: Secondary | ICD-10-CM | POA: Diagnosis not present

## 2023-02-08 DIAGNOSIS — R7989 Other specified abnormal findings of blood chemistry: Secondary | ICD-10-CM

## 2023-02-08 DIAGNOSIS — I10 Essential (primary) hypertension: Secondary | ICD-10-CM

## 2023-02-08 DIAGNOSIS — Z23 Encounter for immunization: Secondary | ICD-10-CM

## 2023-02-08 MED ORDER — ROSUVASTATIN CALCIUM 10 MG PO TABS: 10.0000 mg | ORAL_TABLET | Freq: Every day | ORAL | 3 refills | Status: DC

## 2023-02-08 NOTE — Patient Instructions (Addendum)
Thank you so much for coming to the clinic today!   I have refilled your medication.  Today we are also rechecking your cholesterol as well as your kidney function and electrolytes just to make sure that everything is okay.  If you have any questions please feel free to the call the clinic at anytime at [phone]. It was a pleasure seeing you!  Best, Dr. Thomasene Ripple

## 2023-02-10 DIAGNOSIS — R7989 Other specified abnormal findings of blood chemistry: Secondary | ICD-10-CM | POA: Insufficient documentation

## 2023-02-10 LAB — LIPID PANEL
Chol/HDL Ratio: 3.3 ratio (ref 0.0–5.0)
Cholesterol, Total: 146 mg/dL (ref 100–199)
HDL: 44 mg/dL (ref >39)
LDL Chol Calc (NIH): 86 mg/dL (ref 0–99)
Triglycerides: 82 mg/dL (ref 0–149)
VLDL Cholesterol Cal: 16 mg/dL (ref 5–40)

## 2023-02-10 LAB — BMP8+ANION GAP
Anion Gap: 15 mmol/L (ref 10.0–18.0)
BUN/Creatinine Ratio: 16 (ref 9–20)
BUN: 21 mg/dL (ref 6–24)
CO2: 22 mmol/L (ref 20–29)
Calcium: 10 mg/dL (ref 8.7–10.2)
Chloride: 104 mmol/L (ref 96–106)
Creatinine, Ser: 1.32 mg/dL — ABNORMAL HIGH (ref 0.76–1.27)
Glucose: 91 mg/dL (ref 70–99)
Potassium: 4.2 mmol/L (ref 3.5–5.2)
Sodium: 141 mmol/L (ref 134–144)
eGFR: 69 mL/min/{1.73_m2} (ref >59)

## 2023-02-10 NOTE — Assessment & Plan Note (Signed)
Creatinine on 10/23 revealed a value of 1.41, on repeat BMP today it is 1.32. GFR within normal limits. Unsure etiology of his elevated creatinine, could be his HIV medication. Thankfully, he is not having any symptoms such as decreased urine output.   Will discuss with patient to keep hydrated, and may need urinanalysis.

## 2023-02-10 NOTE — Progress Notes (Signed)
CC: BP follow up  HPI:  [name] is a 42 y.o. male living with a history stated below and presents today for BP follow up. Please see problem based assessment and plan for additional details.  Past Medical History:  Diagnosis Date   C2 cervical fracture (HCC) 08/18/2012   Depression    Heparin induced thrombocytopenia (HCC) 08/27/2012   HIV (human immunodeficiency virus infection) (HCC)    HIV infection (HCC)    Hx of suicide attempt 07/28/2011   Age 29.  Pt stated he wanted to kill himself and he was admitted to a mental health facility for a week or so.    Hypertension    Medial orbital wall fracture (HCC) 08/18/2012   Psychiatric problem    Vertebral artery dissection (HCC) 08/19/2012    Current Outpatient Medications on File Prior to Visit  Medication Sig Dispense Refill   amLODipine (NORVASC) 10 MG tablet Take 1 tablet (10 mg total) by mouth daily. 90 tablet 3   elvitegravir-cobicistat-emtricitabine-tenofovir (GENVOYA) 150-150-200-10 MG TABS tablet Take 1 tablet by mouth daily. 30 tablet 11   hydrOXYzine (ATARAX) 10 MG tablet TAKE ONE TABLET BY MOUTH AS NEEDED FOR ITCHING OR ANXIETY 30 tablet 0   polyethylene glycol (MIRALAX / GLYCOLAX) 17 g packet Take 17 g by mouth daily as needed for moderate constipation. 14 each 5   traZODone (DESYREL) 50 MG tablet TAKE ONE TABLET BY MOUTH DAILY AS NEEDED FOR SLEEP 30 tablet 10   No current facility-administered medications on file prior to visit.    Family History  Problem Relation Age of Onset   Hypertension Mother    Hypertension Father    Heart attack Father 60    Social History   Socioeconomic History   Marital status: Married    Spouse name: Not on file   Number of children: Not on file   Years of education: Not on file   Highest education level: Not on file  Occupational History   Not on file  Tobacco Use   Smoking status: Every Day    Current packs/day: 0.50    Average packs/day: 0.5 packs/day for  20.0 years (10.0 ttl pk-yrs)    Types: Cigarettes   Smokeless tobacco: Never   Tobacco comments:    encouraged to quit to help bp as well  Vaping Use   Vaping status: Never Used  Substance and Sexual Activity   Alcohol use: Yes    Alcohol/week: 8.0 standard drinks of alcohol    Types: 8 Cans of beer per week    Comment: 3 - 6 packs (12oz cans) per week   Drug use: Yes    Frequency: 3.0 times per week    Types: Marijuana, Cocaine   Sexual activity: Yes    Partners: Male    Birth control/protection: None    Comment: declined condoms  Other Topics Concern   Not on file  Social History Narrative   ** Merged History Encounter **       Social Determinants of Health   Financial Resource Strain: Not on file  Food Insecurity: No Food Insecurity (05/26/2022)   Hunger Vital Sign    Worried About Running Out of Food in the Last Year: Never true    Ran Out of Food in the Last Year: Never true  Transportation Needs: Not on file  Physical Activity: Inactive (05/26/2022)   Exercise Vital Sign    Days of Exercise per Week: 0 days    Minutes of  Exercise per Session: 0 min  Stress: Not on file  Social Connections: Socially Isolated (05/26/2022)   Social Connection and Isolation Panel [NHANES]    Frequency of Communication with Friends and Family: More than three times a week    Frequency of Social Gatherings with Friends and Family: Three times a week    Attends Religious Services: Never    Active Member of Clubs or Organizations: No    Attends Banker Meetings: Never    Marital Status: Never married  Intimate Partner Violence: Not At Risk (05/26/2022)   Humiliation, Afraid, Rape, and Kick questionnaire    Fear of Current or Ex-Partner: No    Emotionally Abused: No    Physically Abused: No    Sexually Abused: No    Review of Systems: ROS negative except for what is noted on the assessment and plan.  Vitals:   02/08/23 1334  BP: 133/79  Pulse: 80  Temp: 98.3 F (36.8  C)  TempSrc: Oral  SpO2: 100%  Weight: 154 lb 4.8 oz (70 kg)    Physical Exam: Constitutional: well-appearing male  in no acute distress Cardiovascular: regular rate and rhythm, no m/r/g Pulmonary/Chest: normal work of breathing on room air, lungs clear to auscultation bilaterally Abdominal: soft, non-tender, non-distended  Assessment & Plan:   Essential hypertension, benign Pt presents for follow up for his blood pressure. In clinic today, his blood pressure is 133/79 which is near goal. His current regimen is amlodipine 10mg . Will not make any changes at this time.   Hyperlipidemia Most recent Lipid panel on 10/23 showed an LDL of 89. Checked in clinic revealed a value 86. Will continue rosuvastatin 10mg .   Elevated serum creatinine Creatinine on 10/23 revealed a value of 1.41, on repeat BMP today it is 1.32. GFR within normal limits. Unsure etiology of his elevated creatinine, could be his HIV medication. Thankfully, he is not having any symptoms such as decreased urine output.   Will discuss with patient to keep hydrated, and may need urinanalysis.   Patient discussed with Dr. Kae Heller Earsie Humm, M.D. [facility], PGY-2 Pager: [phone] Date 02/10/2023 Time 6:54 PM

## 2023-02-10 NOTE — Assessment & Plan Note (Signed)
Pt presents for follow up for his blood pressure. In clinic today, his blood pressure is 133/79 which is near goal. His current regimen is amlodipine 10mg . Will not make any changes at this time.

## 2023-02-10 NOTE — Assessment & Plan Note (Signed)
Most recent Lipid panel on 10/23 showed an LDL of 89. Checked in clinic revealed a value 86. Will continue rosuvastatin 10mg .

## 2023-02-14 ENCOUNTER — Encounter: Payer: Self-pay | Admitting: Student

## 2023-02-14 NOTE — Progress Notes (Signed)
Internal Medicine Clinic Attending  Case discussed with the resident at the time of the visit.  We reviewed the resident's history and exam and pertinent patient test results.  I agree with the assessment, diagnosis, and plan of care documented in the resident's note.   Suspect his elevated creatinine is CKD stage II; appears stable over the past year. Consider UA to look for proteinuria at follow up.

## 2023-02-21 ENCOUNTER — Other Ambulatory Visit: Payer: Self-pay

## 2023-02-21 DIAGNOSIS — I10 Essential (primary) hypertension: Secondary | ICD-10-CM

## 2023-02-21 MED ORDER — AMLODIPINE BESYLATE 10 MG PO TABS: 10.0000 mg | ORAL_TABLET | Freq: Every day | ORAL | 3 refills | Status: DC

## 2023-03-27 ENCOUNTER — Ambulatory Visit (INDEPENDENT_AMBULATORY_CARE_PROVIDER_SITE_OTHER): Payer: Medicare Other | Admitting: Internal Medicine

## 2023-03-27 ENCOUNTER — Encounter: Payer: Self-pay | Admitting: Internal Medicine

## 2023-03-27 ENCOUNTER — Other Ambulatory Visit: Payer: Self-pay

## 2023-03-27 VITALS — BP 150/98 | HR 97 | Temp 98.0°F | Resp 16 | Wt 150.2 lb

## 2023-03-27 DIAGNOSIS — Z79899 Other long term (current) drug therapy: Secondary | ICD-10-CM

## 2023-03-27 DIAGNOSIS — B20 Human immunodeficiency virus [HIV] disease: Secondary | ICD-10-CM | POA: Diagnosis present

## 2023-03-27 DIAGNOSIS — Z113 Encounter for screening for infections with a predominantly sexual mode of transmission: Secondary | ICD-10-CM

## 2023-03-27 NOTE — Assessment & Plan Note (Signed)
Lipid panel today Is on a statin now.

## 2023-03-27 NOTE — Assessment & Plan Note (Signed)
He continues to do well with no concerns.  No changes indicated.  Previous labs reviewed with him.  Continue Genvoya and refills provided.  Follow up in 6 months

## 2023-03-27 NOTE — Assessment & Plan Note (Signed)
Low risk with a stable partner.  Anal pap due next visit Screening today

## 2023-03-27 NOTE — Progress Notes (Signed)
   Subjective:    Patient ID: [name], male    DOB: 05/21/1980, 42 y.o.   MRN: [id number]  HPI [name] is here for follow up of HIV He continues on Genvoya and denies any missed doses.  He is doing well and has had no issues with getting or taking his medication.  He has had the same partner for 8 years, no outside sexual activity.     Review of Systems  Constitutional:  Negative for fatigue.       Objective:   Physical Exam Pulmonary:     Effort: Pulmonary effort is normal.  Skin:    Findings: No rash.  Neurological:     Mental Status: He is alert.   SH: + tobacco        Assessment & Plan:

## 2023-03-28 LAB — T-HELPER CELL (CD4) - (RCID CLINIC ONLY)
CD4 % Helper T Cell: 52 % (ref 33–65)
CD4 T Cell Abs: 1317 /uL (ref 400–1790)

## 2023-03-29 LAB — CBC WITH DIFFERENTIAL/PLATELET
Absolute Lymphocytes: 2707 {cells}/uL (ref 850–3900)
Absolute Monocytes: 489 {cells}/uL (ref 200–950)
Basophils Absolute: 87 {cells}/uL (ref 0–200)
Basophils Relative: 1.3 %
Eosinophils Absolute: 168 {cells}/uL (ref 15–500)
Eosinophils Relative: 2.5 %
HCT: 45 % (ref 38.5–50.0)
Hemoglobin: 14.4 g/dL (ref 13.2–17.1)
MCH: 31 pg (ref 27.0–33.0)
MCHC: 32 g/dL (ref 32.0–36.0)
MCV: 97 fL (ref 80.0–100.0)
MPV: 9.5 fL (ref 7.5–12.5)
Monocytes Relative: 7.3 %
Neutro Abs: 3250 {cells}/uL (ref 1500–7800)
Neutrophils Relative %: 48.5 %
Platelets: 480 10*3/uL — ABNORMAL HIGH (ref 140–400)
RBC: 4.64 10*6/uL (ref 4.20–5.80)
RDW: 11.4 % (ref 11.0–15.0)
Total Lymphocyte: 40.4 %
WBC: 6.7 10*3/uL (ref 3.8–10.8)

## 2023-03-29 LAB — COMPLETE METABOLIC PANEL WITH GFR
AG Ratio: 1.6 (calc) (ref 1.0–2.5)
ALT: 22 U/L (ref 9–46)
AST: 17 U/L (ref 10–40)
Albumin: 5.1 g/dL (ref 3.6–5.1)
Alkaline phosphatase (APISO): 81 U/L (ref 36–130)
BUN: 15 mg/dL (ref 7–25)
CO2: 23 mmol/L (ref 20–32)
Calcium: 10.4 mg/dL — ABNORMAL HIGH (ref 8.6–10.3)
Chloride: 103 mmol/L (ref 98–110)
Creat: 1.21 mg/dL (ref 0.60–1.29)
Globulin: 3.1 g/dL (ref 1.9–3.7)
Glucose, Bld: 96 mg/dL (ref 65–99)
Potassium: 3.7 mmol/L (ref 3.5–5.3)
Sodium: 138 mmol/L (ref 135–146)
Total Bilirubin: 0.3 mg/dL (ref 0.2–1.2)
Total Protein: 8.2 g/dL — ABNORMAL HIGH (ref 6.1–8.1)
eGFR: 76 mL/min/{1.73_m2} (ref >=60)

## 2023-03-29 LAB — HIV-1 RNA QUANT-NO REFLEX-BLD
HIV 1 RNA Quant: <20 {copies}/mL — ABNORMAL HIGH
HIV-1 RNA Quant, Log: <1.3 {Log} — ABNORMAL HIGH

## 2023-03-29 LAB — RPR: RPR Ser Ql: NONREACTIVE

## 2023-07-25 ENCOUNTER — Encounter

## 2023-08-20 ENCOUNTER — Other Ambulatory Visit: Payer: Self-pay

## 2023-08-20 DIAGNOSIS — G4700 Insomnia, unspecified: Secondary | ICD-10-CM

## 2023-08-20 DIAGNOSIS — Z21 Asymptomatic human immunodeficiency virus [HIV] infection status: Secondary | ICD-10-CM

## 2023-08-20 MED ORDER — TRAZODONE HCL 50 MG PO TABS
50.0000 mg | ORAL_TABLET | Freq: Every evening | ORAL | 10 refills | Status: AC | PRN
Start: 2023-08-20 — End: ?

## 2023-08-21 NOTE — Progress Notes (Signed)
 The 10-year ASCVD risk score (Arnett DK, et al., 2019) is: 13.8%   Values used to calculate the score:     Age: 43 years     Sex: Male     Is Non-Hispanic African American: Yes     Diabetic: No     Tobacco smoker: Yes     Systolic Blood Pressure: 150 mmHg     Is BP treated: Yes     HDL Cholesterol: 44 mg/dL     Total Cholesterol: 146 mg/dL  Arlon Bergamo, BSN, RN

## 2023-08-31 ENCOUNTER — Other Ambulatory Visit: Payer: Self-pay | Admitting: Internal Medicine

## 2023-08-31 DIAGNOSIS — Z21 Asymptomatic human immunodeficiency virus [HIV] infection status: Secondary | ICD-10-CM

## 2023-10-09 ENCOUNTER — Encounter: Payer: Self-pay | Admitting: Internal Medicine

## 2023-10-09 ENCOUNTER — Other Ambulatory Visit (HOSPITAL_COMMUNITY)
Admission: RE | Admit: 2023-10-09 | Discharge: 2023-10-09 | Disposition: A | Discharge status: Home / Self Care | Source: Ambulatory Visit | Attending: Internal Medicine | Admitting: Internal Medicine

## 2023-10-09 ENCOUNTER — Other Ambulatory Visit: Payer: Self-pay

## 2023-10-09 ENCOUNTER — Other Ambulatory Visit

## 2023-10-09 ENCOUNTER — Ambulatory Visit (INDEPENDENT_AMBULATORY_CARE_PROVIDER_SITE_OTHER): Payer: Self-pay | Admitting: Internal Medicine

## 2023-10-09 VITALS — BP 133/87 | HR 75 | Temp 97.7°F | Wt 143.6 lb

## 2023-10-09 DIAGNOSIS — Z1159 Encounter for screening for other viral diseases: Secondary | ICD-10-CM | POA: Diagnosis not present

## 2023-10-09 DIAGNOSIS — Z113 Encounter for screening for infections with a predominantly sexual mode of transmission: Secondary | ICD-10-CM

## 2023-10-09 DIAGNOSIS — B20 Human immunodeficiency virus [HIV] disease: Secondary | ICD-10-CM

## 2023-10-09 DIAGNOSIS — Z129 Encounter for screening for malignant neoplasm, site unspecified: Secondary | ICD-10-CM | POA: Diagnosis present

## 2023-10-09 NOTE — Progress Notes (Signed)
   Subjective:    Patient ID: [name], male    DOB: 02-Mar-1981, 43 y.o.   MRN: [id number]  HPI [name] is here for follow up of HIV He continues on Genvoya  and denies any missed doses.  He is doing well and has had no issues with getting or taking his medication.  He has had the same partner for 8 years, no outside sexual activity.   --------- 10/09/23 id clinic f/u He showed up and asked to be seen early due to another visit conflict Ordered labs and patient did some of them but left before being seen  Review of Systems  Constitutional:  Negative for fatigue.       Objective:   Physical Exam Pulmonary:     Effort: Pulmonary effort is normal.   Skin:    Findings: No rash.   Neurological:     Mental Status: He is alert.   SH: + tobacco        Assessment & Plan:

## 2023-10-10 ENCOUNTER — Ambulatory Visit: Payer: Medicare Other | Admitting: Internal Medicine

## 2023-10-10 LAB — CYTOLOGY, (ORAL, ANAL, URETHRAL) ANCILLARY ONLY
Chlamydia: NEGATIVE
Chlamydia: NEGATIVE
Comment: NEGATIVE
Comment: NEGATIVE
Comment: NORMAL
Comment: NORMAL
Neisseria Gonorrhea: NEGATIVE
Neisseria Gonorrhea: NEGATIVE

## 2023-10-10 LAB — T-HELPER CELLS (CD4) COUNT (NOT AT ARMC)
CD4 % Helper T Cell: 52 % (ref 33–65)
CD4 T Cell Abs: 940 /uL (ref 400–1790)

## 2023-10-11 LAB — HIV-1 RNA QUANT-NO REFLEX-BLD
HIV 1 RNA Quant: 27 {copies}/mL — ABNORMAL HIGH
HIV-1 RNA Quant, Log: 1.43 {Log_copies}/mL — ABNORMAL HIGH

## 2023-10-11 LAB — RPR TITER: RPR Titer: 1:2 {titer} — ABNORMAL HIGH

## 2023-10-11 LAB — T PALLIDUM AB: T Pallidum Abs: POSITIVE — AB

## 2023-10-11 LAB — RPR: RPR Ser Ql: REACTIVE — AB

## 2023-10-16 LAB — CYTOLOGY - PAP
Comment: NEGATIVE
Diagnosis: NEGATIVE
High risk HPV: NEGATIVE

## 2023-10-17 ENCOUNTER — Ambulatory Visit: Payer: Self-pay | Admitting: Internal Medicine

## 2023-11-01 ENCOUNTER — Other Ambulatory Visit: Payer: Self-pay | Admitting: Internal Medicine

## 2023-11-01 DIAGNOSIS — Z21 Asymptomatic human immunodeficiency virus [HIV] infection status: Secondary | ICD-10-CM

## 2023-12-24 ENCOUNTER — Other Ambulatory Visit: Payer: Self-pay | Admitting: Student

## 2023-12-24 DIAGNOSIS — E785 Hyperlipidemia, unspecified: Secondary | ICD-10-CM

## 2023-12-24 DIAGNOSIS — I10 Essential (primary) hypertension: Secondary | ICD-10-CM

## 2023-12-24 NOTE — Telephone Encounter (Signed)
 Medication sent to pharmacy

## 2023-12-31 ENCOUNTER — Encounter: Payer: Self-pay | Admitting: Student

## 2023-12-31 ENCOUNTER — Ambulatory Visit (INDEPENDENT_AMBULATORY_CARE_PROVIDER_SITE_OTHER): Admitting: Student

## 2023-12-31 VITALS — BP 124/76 | HR 70 | Temp 97.5°F | Wt 144.2 lb

## 2023-12-31 DIAGNOSIS — I10 Essential (primary) hypertension: Secondary | ICD-10-CM | POA: Diagnosis not present

## 2023-12-31 DIAGNOSIS — Z23 Encounter for immunization: Secondary | ICD-10-CM | POA: Diagnosis not present

## 2023-12-31 DIAGNOSIS — L739 Follicular disorder, unspecified: Secondary | ICD-10-CM | POA: Diagnosis not present

## 2023-12-31 DIAGNOSIS — B20 Human immunodeficiency virus [HIV] disease: Secondary | ICD-10-CM

## 2023-12-31 MED ORDER — HYDROXYZINE HCL 10 MG PO TABS: 10.0000 mg | ORAL_TABLET | Freq: Three times a day (TID) | ORAL | 2 refills | Status: AC | PRN

## 2023-12-31 MED ORDER — POLYETHYLENE GLYCOL 3350 17 G PO PACK: 17.0000 g | PACK | Freq: Every day | ORAL | 5 refills | Status: AC | PRN

## 2023-12-31 MED ORDER — DOXYCYCLINE HYCLATE 100 MG PO TABS: 100.0000 mg | ORAL_TABLET | Freq: Two times a day (BID) | ORAL | 0 refills | Status: AC

## 2023-12-31 MED ORDER — HYDROXYZINE HCL 10 MG PO TABS: 10.0000 mg | ORAL_TABLET | Freq: Three times a day (TID) | ORAL | 0 refills | Status: DC | PRN

## 2023-12-31 NOTE — Assessment & Plan Note (Addendum)
 No acute concerns today.  Patient recently had HIV and CD4 count on 10/09/2023.  Viral load of 27.  CD4 count was 940.  He is currently on Genvoya .   Plan: - Continue Genvoya  - Continue to follow with [facility]

## 2023-12-31 NOTE — Assessment & Plan Note (Signed)
 Flu vaccine administered today.

## 2023-12-31 NOTE — Progress Notes (Signed)
 CC: Rash  HPI:  [name] is a 43 y.o. male with a past medical history of hypertension, alcohol  use, HIV who presents to the clinic for follow-up appointment.  Please see assessment and plan for full HPI.  Medications: Hypertension: Amlodipine  10 mg daily HIV: Genvoya  1 tablet daily Anxiety: Hydroxyzine  10 mg as needed for anxiety Constipation: MiraLAX  as needed Hyperlipidemia: Crestor  10 mg daily Insomnia: Trazodone  50 mg nightly as needed Folliculitis: Doxycycline  100 mg twice daily    Past Medical History:  Diagnosis Date   C2 cervical fracture (HCC) 08/18/2012   Depression    Heparin  induced thrombocytopenia (HCC) 08/27/2012   HIV (human immunodeficiency virus infection) (HCC)    HIV infection (HCC)    Hx of suicide attempt 07/28/2011   Age 64.  Pt stated he wanted to kill himself and he was admitted to a mental health facility for a week or so.    Hypertension    Medial orbital wall fracture (HCC) 08/18/2012   Psychiatric problem    Vertebral artery dissection (HCC) 08/19/2012     Current Outpatient Medications:    doxycycline  (VIBRA -TABS) 100 MG tablet, Take 1 tablet (100 mg total) by mouth 2 (two) times daily for 5 days., Disp: 10 tablet, Rfl: 0   amLODipine  (NORVASC ) 10 MG tablet, TAKE ONE TABLET BY MOUTH DAILY, Disp: 90 tablet, Rfl: 2   GENVOYA  150-150-200-10 MG TABS tablet, TAKE 1 TABLET BY MOUTH DAILY, Disp: 30 tablet, Rfl: 5   hydrOXYzine  (ATARAX ) 10 MG tablet, TAKE ONE TABLET BY MOUTH AS NEEDED FOR ITCHING OR ANXIETY, Disp: 30 tablet, Rfl: 0   hydrOXYzine  (ATARAX ) 10 MG tablet, Take 1 tablet (10 mg total) by mouth 3 (three) times daily as needed., Disp: 30 tablet, Rfl: 2   polyethylene glycol (MIRALAX  / GLYCOLAX ) 17 g packet, Take 17 g by mouth daily as needed for moderate constipation., Disp: 14 each, Rfl: 5   rosuvastatin  (CRESTOR ) 10 MG tablet, TAKE 1 TABLET (10 MG TOTAL) BY MOUTH DAILY., Disp: 90 tablet, Rfl: 2   traZODone  (DESYREL ) 50 MG tablet,  Take 1 tablet (50 mg total) by mouth at bedtime as needed for sleep., Disp: 30 tablet, Rfl: 10  Review of Systems:   Skin: Patient endorses rash  Physical Exam:  Vitals:   12/31/23 0950  BP: 124/76  Pulse: 70  Temp: (!) 97.5 F (36.4 C)  TempSrc: Oral  SpO2: 100%  Weight: 144 lb 3.2 oz (65.4 kg)   General: Patient is sitting comfortably in the room  Head: Normocephalic, atraumatic  Cardio: Regular rate and rhythm, no murmurs, rubs or gallops Pulmonary: Clear to ausculation bilaterally with no rales, rhonchi, and crackles  Skin: Diffuse pustular rash noted to right upper extremity axillary, and right chest.  No signs of drainage or bleeding.  Nonvesicular.  Across multiple dermatomes.  Spares the palms and hands left upper extremity unremarkable.  Back unremarkable.  Lower extremities unremarkable.   Assessment & Plan:   Essential hypertension, benign Patient has a past medical history of hypertension.  Current medications include amlodipine  10 mg daily.  He reports adherence to his medication.  Blood pressure today is 124/76.  Plan:  - Continue amlodipine  10 mg daily  HIV disease No acute concerns today.  Patient recently had HIV and CD4 count on 10/09/2023.  Viral load of 27.  CD4 count was 940.  He is currently on Genvoya .   Plan: - Continue Genvoya  - Continue to follow with [facility]  Encounter for immunization  Flu vaccine administered today.  Folliculitis Patient presents with 2-day history of rash.  This is a pustular rash.  Isolated to right chest and right upper extremity proximally.  Spares palms and soles.  No drainage appreciated.  Across multiple dermatomes.  Less likely shingles.  Less likely cellulitis.  Does not seem to be related to an allergy.  Given history of MRSA colonization, will treat as MRSA folliculitis.  Plan: - Start doxycycline  100 mg twice daily for 5 days - Follow-up in 1 week - Hydroxyzine  for itching  Patient discussed with Dr. Mliss Foot  Libby Blanch, DO Internal Medicine Resident PGY-3

## 2023-12-31 NOTE — Assessment & Plan Note (Addendum)
 Patient presents with 2-day history of rash.  This is a pustular rash.  Isolated to right chest and right upper extremity proximally.  Spares palms and soles.  No drainage appreciated.  Across multiple dermatomes.  Less likely shingles.  Less likely cellulitis.  Does not seem to be related to an allergy.  Given history of MRSA colonization, will treat as MRSA folliculitis.  Plan: - Start doxycycline  100 mg twice daily for 5 days - Follow-up in 1 week - Hydroxyzine  for itching

## 2023-12-31 NOTE — Assessment & Plan Note (Signed)
 Patient has a past medical history of hypertension.  Current medications include amlodipine  10 mg daily.  He reports adherence to his medication.  Blood pressure today is 124/76.  Plan:  - Continue amlodipine  10 mg daily

## 2023-12-31 NOTE — Patient Instructions (Addendum)
 [name], [name] you for allowing me to take part in your care today.  Here are your instructions.  1.  Please start taking doxycycline  100 mg twice daily.  This for 5 days.  2.  Please come back in 1 week we can see how this rash is doing.  If your rash improved, please cancel this appointment.  3.  You got your flu vaccine today.  Sometimes you can have fever or chills.  This is normal.  If you do develop any, please obtain Tylenol .  4.  I have refilled your hydroxyzine .  PLEASE BRING YOUR MEDICATIONS TO EVERY APPOINTMENT  Thank you, Dr. Tobie  If you have any other questions please contact the internal medicine clinic at [phone] If it is after hours, please call the [facility] at [phone] and then ask the person who picks up for the resident on call.

## 2024-01-01 ENCOUNTER — Ambulatory Visit: Payer: Self-pay | Admitting: Student

## 2024-01-01 LAB — CBC WITH DIFFERENTIAL/PLATELET
Basophils Absolute: 0.1 x10E3/uL (ref 0.0–0.2)
Basos: 2 %
EOS (ABSOLUTE): 0.2 x10E3/uL (ref 0.0–0.4)
Eos: 3 %
Hematocrit: 41.6 % (ref 37.5–51.0)
Hemoglobin: 12.9 g/dL — ABNORMAL LOW (ref 13.0–17.7)
Immature Grans (Abs): 0 x10E3/uL (ref 0.0–0.1)
Immature Granulocytes: 0 %
Lymphocytes Absolute: 2.2 x10E3/uL (ref 0.7–3.1)
Lymphs: 48 %
MCH: 30.9 pg (ref 26.6–33.0)
MCHC: 31 g/dL — ABNORMAL LOW (ref 31.5–35.7)
MCV: 100 fL — ABNORMAL HIGH (ref 79–97)
Monocytes Absolute: 0.5 x10E3/uL (ref 0.1–0.9)
Monocytes: 11 %
Neutrophils Absolute: 1.7 x10E3/uL (ref 1.4–7.0)
Neutrophils: 36 %
Platelets: 407 x10E3/uL (ref 150–450)
RBC: 4.17 x10E6/uL (ref 4.14–5.80)
RDW: 11.7 % (ref 11.6–15.4)
WBC: 4.7 x10E3/uL (ref 3.4–10.8)

## 2024-01-01 LAB — COMPREHENSIVE METABOLIC PANEL WITH GFR
ALT: 21 IU/L (ref 0–44)
AST: 13 IU/L (ref 0–40)
Albumin: 4.7 g/dL (ref 4.1–5.1)
Alkaline Phosphatase: 75 IU/L (ref 47–123)
BUN/Creatinine Ratio: 18 (ref 9–20)
BUN: 21 mg/dL (ref 6–24)
Bilirubin Total: 0.2 mg/dL (ref 0.0–1.2)
CO2: 21 mmol/L (ref 20–29)
Calcium: 9.8 mg/dL (ref 8.7–10.2)
Chloride: 104 mmol/L (ref 96–106)
Creatinine, Ser: 1.16 mg/dL (ref 0.76–1.27)
Globulin, Total: 2.5 g/dL (ref 1.5–4.5)
Glucose: 77 mg/dL (ref 70–99)
Potassium: 4.9 mmol/L (ref 3.5–5.2)
Sodium: 141 mmol/L (ref 134–144)
Total Protein: 7.2 g/dL (ref 6.0–8.5)
eGFR: 80 mL/min/1.73

## 2024-01-01 NOTE — Progress Notes (Signed)
 Internal Medicine Clinic Attending  Case discussed with the resident at the time of the visit.  We reviewed the resident's history and exam and pertinent patient test results.  I agree with the assessment, diagnosis, and plan of care documented in the resident's note.   See picture in Media tab for rash CBC & CMP stable. We considered VZV, syphilis, contact dermatitis, drug reaction, but I agree that rash is most consistent with folliculitis

## 2024-02-20 ENCOUNTER — Ambulatory Visit

## 2024-02-20 VITALS — BP 117/78 | HR 78 | Temp 97.9°F | Ht 68.0 in | Wt 149.6 lb

## 2024-02-20 DIAGNOSIS — Z0000 Encounter for general adult medical examination without abnormal findings: Secondary | ICD-10-CM | POA: Diagnosis not present

## 2024-02-20 NOTE — Progress Notes (Signed)
 Chief Complaint  Patient presents with   Medicare Wellness    SUBSEQUENT     Subjective:   [name] is a 43 y.o. male who presents for a Medicare Annual Wellness Visit.  Allergies (verified) Penicillins and Heparin    History: Past Medical History:  Diagnosis Date   C2 cervical fracture (HCC) 08/18/2012   Depression    Heparin  induced thrombocytopenia 08/27/2012   HIV (human immunodeficiency virus infection) (HCC)    HIV infection (HCC)    Hx of suicide attempt 07/28/2011   Age 58.  Pt stated he wanted to kill himself and he was admitted to a mental health facility for a week or so.    Hypertension    Medial orbital wall fracture (HCC) 08/18/2012   Psychiatric problem    Vertebral artery dissection 08/19/2012   Past Surgical History:  Procedure Laterality Date   ORIF CLAVICULAR FRACTURE Right 08/21/2012   Procedure: OPEN REDUCTION INTERNAL FIXATION (ORIF) CLAVICULAR FRACTURE;  Surgeon: Oneil JAYSON Herald, MD;  Location: [facility] OR;  Service: Orthopedics;  Laterality: Right;   ORIF TIBIA PLATEAU Right 08/21/2012   Procedure: OPEN REDUCTION INTERNAL FIXATION (ORIF) RIGHT LATERAL TIBIAL PLATEAU;  Surgeon: Oneil JAYSON Herald, MD;  Location: [facility] OR;  Service: Orthopedics;  Laterality: Right;   Family History  Problem Relation Age of Onset   Hypertension Mother    Hypertension Father    Heart attack Father 68   Social History   Occupational History   Not on file  Tobacco Use   Smoking status: Every Day    Current packs/day: 0.50    Average packs/day: 0.5 packs/day for 20.0 years (10.0 ttl pk-yrs)    Types: Cigarettes   Smokeless tobacco: Never   Tobacco comments:    encouraged to quit to help bp as well  Vaping Use   Vaping status: Never Used  Substance and Sexual Activity   Alcohol  use: Yes    Alcohol /week: 8.0 standard drinks of alcohol     Types: 8 Cans of beer per week    Comment: 3 - 6 packs (12oz cans) per week   Drug use: Yes    Frequency: 3.0 times per week     Types: Marijuana, Cocaine    Sexual activity: Yes    Partners: Male    Birth control/protection: None    Comment: declined condoms   Tobacco Counseling Ready to quit: Not Answered Counseling given: Not Answered Tobacco comments: encouraged to quit to help bp as well  SDOH Screenings   Food Insecurity: No Food Insecurity (02/20/2024)  Housing: Low Risk  (02/20/2024)  Transportation Needs: No Transportation Needs (02/20/2024)  Utilities: Not At Risk (02/20/2024)  Alcohol  Screen: Low Risk  (05/26/2022)  Depression (PHQ2-9): Low Risk  (02/20/2024)  Physical Activity: Inactive (02/20/2024)  Social Connections: Socially Isolated (02/20/2024)  Stress: No Stress Concern Present (02/20/2024)  Tobacco Use: High Risk (02/20/2024)  Health Literacy: Adequate Health Literacy (02/20/2024)   Depression Screen    02/20/2024    8:45 AM 10/09/2023   10:36 AM 03/27/2023    1:38 PM 09/12/2022    3:41 PM 05/26/2022   12:49 PM 05/26/2022   11:12 AM 01/31/2022    9:25 AM  PHQ 2/9 Scores  PHQ - 2 Score 0 0 0 0 0 0 0  PHQ- 9 Score 0 0      0      Data saved with a previous flowsheet row definition     Goals Addressed  This Visit's Progress    02/20/2024: I want to walk more.         Visit info / Clinical Intake: Medicare Wellness Visit Type:: Subsequent Annual Wellness Visit Persons participating in visit:: patient Medicare Wellness Visit Mode:: In-person (required for WTM) Information given by:: patient Interpreter Needed?: No Pre-visit prep was completed: yes AWV questionnaire completed by patient prior to visit?: no Living arrangements:: (!) lives alone Patient's Overall Health Status Rating: good Typical amount of pain: none Does pain affect daily life?: no Are you currently prescribed opioids?: no  Dietary Habits and Nutritional Risks How many meals a day?: (!) 1 (SNACKS) Eats fruit and vegetables daily?: (!) no Most meals are obtained by: preparing own meals In  the last 2 weeks, have you had any of the following?: none Diabetic:: no  Functional Status Activities of Daily Living (to include ambulation/medication): Independent Ambulation: Independent with device- listed below Medication Administration: Independent Home Management: Independent Manage your own finances?: yes Primary transportation is: driving; family/friends Concerns about vision?: no *vision screening is required for WTM* Concerns about hearing?: no  Fall Screening Falls in the past year?: 0 Number of falls in past year: 0 Was there an injury with Fall?: 0 Fall Risk Category Calculator: 0 Patient Fall Risk Level: Low Fall Risk  Fall Risk Patient at Risk for Falls Due to: No Fall Risks Fall risk Follow up: Falls evaluation completed; Education provided  Home and Transportation Safety: All rugs have non-skid backing?: N/A, no rugs All stairs or steps have railings?: yes Grab bars in the bathtub or shower?: (!) no Have non-skid surface in bathtub or shower?: (!) no Good home lighting?: yes Regular seat belt use?: yes Hospital stays in the last year:: no  Cognitive Assessment Difficulty concentrating, remembering, or making decisions? : no (BSE: GAMES ON PHONE, READING, FACEBOOK) Will 6CIT or Mini Cog be Completed: no  Advance Directives (For Healthcare) Does Patient Have a Medical Advance Directive?: No Would patient like information on creating a medical advance directive?: No - Patient declined  Reviewed/Updated  Reviewed/Updated: Reviewed All (Medical, Surgical, Family, Medications, Allergies, Care Teams, Patient Goals)        Objective:    Today's Vitals   02/20/24 0839  BP: 117/78  Pulse: 78  Temp: 97.9 F (36.6 C)  TempSrc: Temporal  SpO2: 98%  Weight: 149 lb 9.6 oz (67.9 kg)  Height: 5' 8 (1.727 m)  PainSc: 0-No pain   Body mass index is 22.75 kg/m.  Current Medications (verified) Outpatient Encounter Medications as of 02/20/2024   Medication Sig   amLODipine  (NORVASC ) 10 MG tablet TAKE ONE TABLET BY MOUTH DAILY   GENVOYA  150-150-200-10 MG TABS tablet TAKE 1 TABLET BY MOUTH DAILY   hydrOXYzine  (ATARAX ) 10 MG tablet TAKE ONE TABLET BY MOUTH AS NEEDED FOR ITCHING OR ANXIETY   hydrOXYzine  (ATARAX ) 10 MG tablet Take 1 tablet (10 mg total) by mouth 3 (three) times daily as needed.   polyethylene glycol (MIRALAX  / GLYCOLAX ) 17 g packet Take 17 g by mouth daily as needed for moderate constipation.   rosuvastatin  (CRESTOR ) 10 MG tablet TAKE 1 TABLET (10 MG TOTAL) BY MOUTH DAILY.   traZODone  (DESYREL ) 50 MG tablet Take 1 tablet (50 mg total) by mouth at bedtime as needed for sleep.   No facility-administered encounter medications on file as of 02/20/2024.   Hearing/Vision screen Hearing Screening - Comments:: Adequate hearing. Vision Screening - Comments:: Adequate vision, no eyeglasses. Immunizations and Health Maintenance Health Maintenance  Topic  Date Due   HPV VACCINES (1 - Risk 3-dose SCDM series) Never done   COVID-19 Vaccine (3 - Pfizer risk series) 01/26/2020   Medicare Annual Wellness (AWV)  02/19/2025   Pneumococcal Vaccine (4 of 4 - PCV20 or PCV21) 05/22/2029   DTaP/Tdap/Td (2 - Td or Tdap) 02/01/2032   Influenza Vaccine  Completed   Hepatitis B Vaccines 19-59 Average Risk  Completed   Hepatitis C Screening  Completed   HIV Screening  Completed   Meningococcal B Vaccine  Aged Out        Assessment/Plan:  This is a routine wellness examination for [name].  Patient Care Team: Tobie Gaines, DO as PCP - General Comer, Lamar ORN, MD (Inactive) as PCP - Infectious Diseases (Infectious Diseases) Comer, Lamar ORN, MD (Inactive) (Infectious Diseases)  I have personally reviewed and noted the following in the patient's chart:   Medical and social history Use of alcohol , tobacco or illicit drugs  Current medications and supplements including opioid prescriptions. Functional ability and status Nutritional  status Physical activity Advanced directives List of other physicians Hospitalizations, surgeries, and ER visits in previous 12 months Vitals Screenings to include cognitive, depression, and falls Referrals and appointments  No orders of the defined types were placed in this encounter.  In addition, I have reviewed and discussed with patient certain preventive protocols, quality metrics, and best practice recommendations. A written personalized care plan for preventive services as well as general preventive health recommendations were provided to patient.   Gary LOISE Fuller, LPN   [id number]   Return in about 1 year (around 02/19/2025) for Medicare wellness.  After Visit Summary: (In Person-Printed) AVS printed and given to the patient  Nurse Notes: None at this time.

## 2024-02-20 NOTE — Patient Instructions (Signed)
 [name],  Thank you for taking the time for your Medicare Wellness Visit. I appreciate your continued commitment to your health goals. Please review the care plan we discussed, and feel free to reach out if I can assist you further.  Please note that Annual Wellness Visits do not include a physical exam. Some assessments may be limited, especially if the visit was conducted virtually. If needed, we may recommend an in-person follow-up with your provider.  Ongoing Care Seeing your primary care provider every 3 to 6 months helps us  monitor your health and provide consistent, personalized care.   Referrals If a referral was made during today's visit and you haven't received any updates within two weeks, please contact the referred provider directly to check on the status.  Recommended Screenings:  Health Maintenance  Topic Date Due   HPV Vaccine (1 - Risk 3-dose SCDM series) Never done   COVID-19 Vaccine (3 - Pfizer risk series) 01/26/2020   Medicare Annual Wellness Visit  05/27/2023   Pneumococcal Vaccine (4 of 4 - PCV20 or PCV21) 05/22/2029   DTaP/Tdap/Td vaccine (2 - Td or Tdap) 02/01/2032   Flu Shot  Completed   Hepatitis B Vaccine  Completed   Hepatitis C Screening  Completed   HIV Screening  Completed   Meningitis B Vaccine  Aged Out       02/20/2024    8:45 AM  Advanced Directives  Does Patient Have a Medical Advance Directive? No  Would patient like information on creating a medical advance directive? No - Patient declined    Vision: Annual vision screenings are recommended for early detection of glaucoma, cataracts, and diabetic retinopathy. These exams can also reveal signs of chronic conditions such as diabetes and high blood pressure.  Dental: Annual dental screenings help detect early signs of oral cancer, gum disease, and other conditions linked to overall health, including heart disease and diabetes.  Please see the attached documents for additional preventive  care recommendations.

## 2024-04-02 NOTE — Addendum Note (Signed)
 Addended by: ROSAN DAYTON BROCKS on: 04/02/2024 09:54 AM   Modules accepted: Level of Service

## 2024-04-02 NOTE — Progress Notes (Signed)
 Internal Medicine Attending:  I reviewed the AWV findings of the medical professional who conducted the visit. I was present in the office suite and immediately available to provide assistance and direction throughout the time the service was provided.

## 2024-04-15 ENCOUNTER — Ambulatory Visit: Admitting: Internal Medicine

## 2024-04-16 ENCOUNTER — Other Ambulatory Visit: Payer: Self-pay | Admitting: Internal Medicine

## 2024-04-16 DIAGNOSIS — Z21 Asymptomatic human immunodeficiency virus [HIV] infection status: Secondary | ICD-10-CM

## 2024-04-30 ENCOUNTER — Ambulatory Visit: Admitting: Internal Medicine

## 2024-05-07 ENCOUNTER — Other Ambulatory Visit (HOSPITAL_COMMUNITY)
Admission: RE | Admit: 2024-05-07 | Discharge: 2024-05-07 | Disposition: A | Source: Ambulatory Visit | Attending: Internal Medicine | Admitting: Internal Medicine

## 2024-05-07 ENCOUNTER — Ambulatory Visit: Payer: Self-pay | Admitting: Internal Medicine

## 2024-05-07 ENCOUNTER — Encounter: Payer: Self-pay | Admitting: Internal Medicine

## 2024-05-07 ENCOUNTER — Other Ambulatory Visit: Payer: Self-pay

## 2024-05-07 VITALS — BP 114/75 | HR 75 | Temp 97.7°F | Ht 68.0 in | Wt 145.0 lb

## 2024-05-07 DIAGNOSIS — Z129 Encounter for screening for malignant neoplasm, site unspecified: Secondary | ICD-10-CM | POA: Insufficient documentation

## 2024-05-07 DIAGNOSIS — B20 Human immunodeficiency virus [HIV] disease: Secondary | ICD-10-CM | POA: Diagnosis not present

## 2024-05-07 DIAGNOSIS — Z79899 Other long term (current) drug therapy: Secondary | ICD-10-CM | POA: Diagnosis not present

## 2024-05-07 MED ORDER — GENVOYA 150-150-200-10 MG PO TABS: 1.0000 | ORAL_TABLET | Freq: Every day | ORAL | 11 refills | Status: AC

## 2024-05-07 NOTE — Patient Instructions (Signed)
 Smoking Cessation: Quitline[place] 1-800-QUIT-NOW 670-772-4699); Espaol: 1-855-Djelo-Ya (1-737-271-8849) http://carroll-castaneda.info/

## 2024-05-07 NOTE — Progress Notes (Signed)
" ° °  Subjective:    Patient ID: [name], male    DOB: May 22, 1980, 44 y.o.   MRN: [id number]  HPI [name] is here for follow up of HIV He continues on Genvoya  and denies any missed doses.  He is doing well and has had no issues with getting or taking his medication.  He has had the same partner for 8 years, no outside sexual activity.   --------- 05/07/24 id clinic f/u No complaint Due for prevnar 20 vaccine today Ok to do anal pap Wants to stick with genvoya  No sexual activity   Ros: All other ROS negative      Objective:   Physical Exam Pulmonary:     Effort: Pulmonary effort is normal.  Skin:    Findings: No rash.  Neurological:     Mental Status: He is alert.   SH: + tobacco  Lab Results  Component Value Date   WBC 4.7 12/31/2023   HGB 12.9 (L) 12/31/2023   HCT 41.6 12/31/2023   MCV 100 (H) 12/31/2023   PLT 407 12/31/2023   Last metabolic panel Lab Results  Component Value Date   GLUCOSE 77 12/31/2023   NA 141 12/31/2023   K 4.9 12/31/2023   CL 104 12/31/2023   CO2 21 12/31/2023   BUN 21 12/31/2023   CREATININE 1.16 12/31/2023   EGFR 80 12/31/2023   CALCIUM  9.8 12/31/2023   PROT 7.2 12/31/2023   ALBUMIN 4.7 12/31/2023   LABGLOB 2.5 12/31/2023   AGRATIO 1.7 06/13/2021   BILITOT 0.2 12/31/2023   ALKPHOS 75 12/31/2023   AST 13 12/31/2023   ALT 21 12/31/2023   ANIONGAP 11 02/08/2018    HIV: Lab Results  Component Value Date   HIV1RNAQUANT 27 (H) 10/09/2023   Lab Results  Component Value Date   CD4TCELL 52 10/09/2023   CD4TABS 940 10/09/2023         Assessment & Plan:   #hiv Doing well on genvoya  and wants to stick with it for now   -discussed u=u -encourage compliance -continue current HIV medication genvoya  -labs today -f/u in 11 months   #social Lives alone On social disability; not working Not sexually active    #hcm -vaccination Prevnar 20  -hepatitis 2019 hep b vaccine repeat series given; will test hep b sAb  next visit 2019 hep b sAb nonreactive  2013 hep b cAb and sAg nonreactive -cancer Anal cancer screening self collect 04/2024 -metabolic Discussed reprieve 04/2024 On rosuvastatin  and will continue -tb screening Will do at some point -std screening 04/2024 patient said not sexually active  "

## 2024-05-12 LAB — CYTOLOGY - PAP
Diagnosis: NEGATIVE
Diagnosis: REACTIVE

## 2024-06-23 ENCOUNTER — Ambulatory Visit: Payer: Self-pay | Admitting: Student

## 2025-03-17 ENCOUNTER — Ambulatory Visit: Payer: Self-pay | Admitting: Internal Medicine
